# Patient Record
Sex: Female | Born: 1985 | Race: Black or African American | Hispanic: No | Marital: Married | Smoking: Never smoker
Health system: Southern US, Community
[De-identification: ages and names within clinical notes are randomized; demographics above are authoritative.]

## PROBLEM LIST (undated history)

## (undated) ENCOUNTER — Inpatient Hospital Stay (HOSPITAL_COMMUNITY): Payer: Self-pay

## (undated) DIAGNOSIS — D649 Anemia, unspecified: Secondary | ICD-10-CM

## (undated) DIAGNOSIS — D219 Benign neoplasm of connective and other soft tissue, unspecified: Secondary | ICD-10-CM

## (undated) HISTORY — DX: Benign neoplasm of connective and other soft tissue, unspecified: D21.9

---

## 2011-10-17 ENCOUNTER — Encounter (HOSPITAL_COMMUNITY): Payer: Self-pay | Admitting: *Deleted

## 2011-10-17 ENCOUNTER — Emergency Department (HOSPITAL_COMMUNITY)
Admission: EM | Admit: 2011-10-17 | Discharge: 2011-10-17 | Disposition: A | Discharge status: Home / Self Care | Payer: Self-pay | Attending: Emergency Medicine | Admitting: Emergency Medicine

## 2011-10-17 DIAGNOSIS — M545 Low back pain, unspecified: Secondary | ICD-10-CM | POA: Insufficient documentation

## 2011-10-17 DIAGNOSIS — S39012A Strain of muscle, fascia and tendon of lower back, initial encounter: Secondary | ICD-10-CM

## 2011-10-17 LAB — URINALYSIS, ROUTINE W REFLEX MICROSCOPIC
Hgb urine dipstick: NEGATIVE
Nitrite: NEGATIVE
Protein, ur: NEGATIVE mg/dL
Specific Gravity, Urine: 1.028 (ref 1.005–1.030)
Urobilinogen, UA: 1 mg/dL (ref 0.0–1.0)

## 2011-10-17 LAB — POCT PREGNANCY, URINE: Preg Test, Ur: NEGATIVE

## 2011-10-17 MED ORDER — DIAZEPAM 5 MG PO TABS: 5.0000 mg | ORAL_TABLET | Freq: Two times a day (BID) | ORAL | Status: AC

## 2011-10-17 MED ORDER — IBUPROFEN 200 MG PO TABS: 800.0000 mg | ORAL_TABLET | Freq: Three times a day (TID) | ORAL | Status: AC

## 2011-10-17 NOTE — ED Provider Notes (Signed)
History     CSN: [id number]  Arrival date & time 10/17/11  1435   First MD Initiated Contact with Patient 10/17/11 1521      Chief Complaint  Patient presents with  . Back Pain     HPI The patient presents over concerns of the back pain. Chest the onset was 2 days ago, insidious.  She does recall walking and running prior to the onset of symptoms.  She recalls that soon after the onset she may be bending over motion and the pain became worse.  The pain is sore, so it was initially sharp.  The pain is bilateral, otherwise nonradiating.  She notes mild chills, no nausea, no vomiting, no dysuria, no incontinence, no distal extremity dysesthesia or weakness. She notes mild improvement with Aleve, mild exacerbation with motion. History reviewed. No pertinent past medical history.  History reviewed. No pertinent past surgical history.  No family history on file.  History  Substance Use Topics  . Smoking status: Never Smoker   . Smokeless tobacco: Not on file  . Alcohol Use: No     social    OB History    Grav Para Term Preterm Abortions TAB SAB Ect Mult Living                  Review of Systems  All other systems reviewed and are negative.    Allergies  Review of patient's allergies indicates no known allergies.  Home Medications   Current Outpatient Rx  Name Route Sig Dispense Refill  . NAPROXEN SODIUM 220 MG PO TABS Oral Take 220 mg by mouth 2 (two) times daily as needed. FOR PAIN      BP 117/57  Pulse 73  Temp 98.4 F (36.9 C) (Oral)  Resp 17  SpO2 100%  LMP 10/05/2011  Physical Exam  Nursing note and vitals reviewed. Constitutional: She is oriented to person, place, and time. She appears well-developed and well-nourished. No distress.  HENT:  Head: Normocephalic and atraumatic.  Eyes: Conjunctivae and EOM are normal.  Cardiovascular: Normal rate and regular rhythm.   Pulmonary/Chest: Effort normal and breath sounds normal. No stridor. No respiratory  distress.  Abdominal: She exhibits no distension.  Musculoskeletal: She exhibits no edema.       Strength is 5/5 in both lower extremities, gait is appropriate. There is no appreciable tenderness to palpation, nor deformity to either side of her lower back.  Neurological: She is alert and oriented to person, place, and time. No cranial nerve deficit.  Skin: Skin is warm and dry.  Psychiatric: She has a normal mood and affect.    ED Course  Procedures (including critical care time)   Labs Reviewed  URINALYSIS, ROUTINE W REFLEX MICROSCOPIC   No results found.   No diagnosis found.    MDM  This generally well-appearing young female presents with several days of low back pain.  On exam the patient has no notable deficits, nor deformity.  Given the patient's description of mouth chills she was evaluated with a urinalysis.  This was unremarkable.  The patient was discharged with instructions on muscle strain.  We discussed return precautions.  Gerhard Munch, MD 10/17/11 [phone]

## 2011-10-17 NOTE — ED Notes (Signed)
Pt reports back pain starting two days ago located at bilateral lower back and worsens with movement- especially bending over.  Pt denies known injury. Pt denies history of the same.  Pt has taken aleve for pain this AM and yesterday without relief. Pt denies incontinence, numbness or tingling.

## 2013-02-08 ENCOUNTER — Other Ambulatory Visit (HOSPITAL_COMMUNITY): Payer: Self-pay | Admitting: Nurse Practitioner

## 2013-02-08 LAB — OB RESULTS CONSOLE ABO/RH: RH TYPE: POSITIVE

## 2013-02-08 LAB — OB RESULTS CONSOLE HGB/HCT, BLOOD
HEMATOCRIT: 33 %
Hemoglobin: 11.4 g/dL

## 2013-02-08 LAB — OB RESULTS CONSOLE ANTIBODY SCREEN: Antibody Screen: NEGATIVE

## 2013-02-08 LAB — OB RESULTS CONSOLE HEPATITIS B SURFACE ANTIGEN: Hepatitis B Surface Ag: NEGATIVE

## 2013-02-08 LAB — OB RESULTS CONSOLE GC/CHLAMYDIA
Chlamydia: NEGATIVE
Gonorrhea: NEGATIVE

## 2013-02-08 LAB — SYPHILIS ANTIBODY, IGM, ELISA: RPR: NONREACTIVE

## 2013-02-08 LAB — DRUG SCREEN, URINE: Drug Screen, Urine: NEGATIVE

## 2013-02-08 LAB — OB RESULTS CONSOLE PLATELET COUNT: Platelets: 174 10*3/uL

## 2013-02-08 LAB — HGB ELECTROPHORESIS: Hemoglobin, Elp: NORMAL

## 2013-02-08 LAB — OB RESULTS CONSOLE VARICELLA ZOSTER ANTIBODY, IGG: VARICELLA IGG: IMMUNE

## 2013-02-08 LAB — OB RESULTS CONSOLE RUBELLA ANTIBODY, IGM: RUBELLA: IMMUNE

## 2013-02-08 LAB — OB RESULTS CONSOLE HIV ANTIBODY (ROUTINE TESTING): HIV: NONREACTIVE

## 2013-02-09 ENCOUNTER — Other Ambulatory Visit (HOSPITAL_COMMUNITY): Payer: Self-pay | Admitting: Nurse Practitioner

## 2013-02-09 DIAGNOSIS — Z3682 Encounter for antenatal screening for nuchal translucency: Secondary | ICD-10-CM

## 2013-02-12 ENCOUNTER — Ambulatory Visit (HOSPITAL_COMMUNITY)
Admission: RE | Admit: 2013-02-12 | Discharge: 2013-02-12 | Disposition: A | Discharge status: Home / Self Care | Payer: Medicaid Other | Source: Ambulatory Visit | Attending: Nurse Practitioner | Admitting: Nurse Practitioner

## 2013-02-12 ENCOUNTER — Encounter (HOSPITAL_COMMUNITY): Payer: Self-pay

## 2013-02-12 ENCOUNTER — Other Ambulatory Visit: Payer: Self-pay

## 2013-02-12 DIAGNOSIS — O351XX0 Maternal care for (suspected) chromosomal abnormality in fetus, not applicable or unspecified: Secondary | ICD-10-CM | POA: Insufficient documentation

## 2013-02-12 DIAGNOSIS — O3510X0 Maternal care for (suspected) chromosomal abnormality in fetus, unspecified, not applicable or unspecified: Secondary | ICD-10-CM | POA: Insufficient documentation

## 2013-02-12 DIAGNOSIS — Z3682 Encounter for antenatal screening for nuchal translucency: Secondary | ICD-10-CM

## 2013-02-12 DIAGNOSIS — Z3689 Encounter for other specified antenatal screening: Secondary | ICD-10-CM | POA: Insufficient documentation

## 2013-02-12 NOTE — Progress Notes (Signed)
[name]  was seen today for an ultrasound appointment.  See full report in [facility].  Impression: Single IUP at 13 2/7 weeks Normal NT (1.3 mm).  Nasal bone visualized. First trimester aneuploidy screen performed as noted above.  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  Recommend ultrasound for fetal anatomy at approximately [redacted] weeks gestation  Alpha Gula, MD

## 2013-03-08 ENCOUNTER — Other Ambulatory Visit (HOSPITAL_COMMUNITY): Payer: Self-pay | Admitting: Nurse Practitioner

## 2013-03-08 DIAGNOSIS — Z3689 Encounter for other specified antenatal screening: Secondary | ICD-10-CM

## 2013-03-15 LAB — AFP, QUAD SCREEN: Quad Risk Down Syndrome: BORDERLINE

## 2013-03-22 ENCOUNTER — Other Ambulatory Visit (HOSPITAL_COMMUNITY): Payer: Self-pay | Admitting: Nurse Practitioner

## 2013-03-22 ENCOUNTER — Ambulatory Visit (HOSPITAL_COMMUNITY)
Admission: RE | Admit: 2013-03-22 | Discharge: 2013-03-22 | Disposition: A | Discharge status: Home / Self Care | Payer: Medicaid Other | Source: Ambulatory Visit | Attending: Nurse Practitioner | Admitting: Nurse Practitioner

## 2013-03-22 DIAGNOSIS — Z3689 Encounter for other specified antenatal screening: Secondary | ICD-10-CM

## 2013-04-26 ENCOUNTER — Other Ambulatory Visit (HOSPITAL_COMMUNITY): Payer: Self-pay | Admitting: Nurse Practitioner

## 2013-04-26 DIAGNOSIS — D219 Benign neoplasm of connective and other soft tissue, unspecified: Secondary | ICD-10-CM

## 2013-04-26 DIAGNOSIS — Z0489 Encounter for examination and observation for other specified reasons: Secondary | ICD-10-CM

## 2013-04-26 DIAGNOSIS — O26879 Cervical shortening, unspecified trimester: Secondary | ICD-10-CM

## 2013-04-26 DIAGNOSIS — IMO0002 Reserved for concepts with insufficient information to code with codable children: Secondary | ICD-10-CM

## 2013-05-01 ENCOUNTER — Ambulatory Visit (HOSPITAL_COMMUNITY)
Admission: RE | Admit: 2013-05-01 | Discharge: 2013-05-01 | Disposition: A | Discharge status: Home / Self Care | Payer: Medicaid Other | Source: Ambulatory Visit | Attending: Nurse Practitioner | Admitting: Nurse Practitioner

## 2013-05-01 ENCOUNTER — Inpatient Hospital Stay (HOSPITAL_COMMUNITY)
Admission: AD | Admit: 2013-05-01 | Discharge: 2013-05-01 | Disposition: A | Discharge status: Home / Self Care | Payer: Self-pay | Source: Ambulatory Visit | Attending: Obstetrics & Gynecology | Admitting: Obstetrics & Gynecology

## 2013-05-01 ENCOUNTER — Encounter (HOSPITAL_COMMUNITY): Payer: Self-pay | Admitting: *Deleted

## 2013-05-01 DIAGNOSIS — M545 Low back pain, unspecified: Secondary | ICD-10-CM | POA: Insufficient documentation

## 2013-05-01 DIAGNOSIS — D219 Benign neoplasm of connective and other soft tissue, unspecified: Secondary | ICD-10-CM

## 2013-05-01 DIAGNOSIS — Z0489 Encounter for examination and observation for other specified reasons: Secondary | ICD-10-CM

## 2013-05-01 DIAGNOSIS — Z363 Encounter for antenatal screening for malformations: Secondary | ICD-10-CM | POA: Insufficient documentation

## 2013-05-01 DIAGNOSIS — Z1389 Encounter for screening for other disorder: Secondary | ICD-10-CM | POA: Insufficient documentation

## 2013-05-01 DIAGNOSIS — O26879 Cervical shortening, unspecified trimester: Secondary | ICD-10-CM

## 2013-05-01 DIAGNOSIS — B3731 Acute candidiasis of vulva and vagina: Secondary | ICD-10-CM | POA: Insufficient documentation

## 2013-05-01 DIAGNOSIS — B373 Candidiasis of vulva and vagina: Secondary | ICD-10-CM

## 2013-05-01 DIAGNOSIS — O26872 Cervical shortening, second trimester: Secondary | ICD-10-CM

## 2013-05-01 DIAGNOSIS — O2390 Unspecified genitourinary tract infection in pregnancy, unspecified trimester: Secondary | ICD-10-CM | POA: Insufficient documentation

## 2013-05-01 DIAGNOSIS — IMO0002 Reserved for concepts with insufficient information to code with codable children: Secondary | ICD-10-CM

## 2013-05-01 HISTORY — DX: Benign neoplasm of connective and other soft tissue, unspecified: D21.9

## 2013-05-01 LAB — WET PREP, GENITAL
CLUE CELLS WET PREP: NONE SEEN
TRICH WET PREP: NONE SEEN

## 2013-05-01 MED ORDER — FLUCONAZOLE 150 MG PO TABS
150.0000 mg | ORAL_TABLET | Freq: Once | ORAL | Status: AC
  Administered 2013-05-01: 150 mg via ORAL
  Filled 2013-05-01: qty 1

## 2013-05-01 MED ORDER — PROGESTERONE MICRONIZED 200 MG PO CAPS: ORAL_CAPSULE | ORAL | Status: DC

## 2013-05-01 MED ORDER — BETAMETHASONE SOD PHOS & ACET 6 (3-3) MG/ML IJ SUSP
12.0000 mg | Freq: Once | INTRAMUSCULAR | Status: AC
  Administered 2013-05-01: 12 mg via INTRAMUSCULAR
  Filled 2013-05-01: qty 2

## 2013-05-01 NOTE — Discharge Instructions (Signed)
Cervical Insufficiency  Cervical insufficiency is when the cervix is weak and starts to open (dilate) and thin (efface) before the pregnancy is at term and without labor starting. This is also called incompetent cervix. It can happen in the second or third trimester when the fetus starts putting pressure on the cervix. Cervical insufficiency can lead to a miscarriage, preterm premature rupture of the membranes (PPROM), or having the baby early (preterm birth).  RISK FACTORS You may be more likely to develop cervical insufficiency if:  You have a shorter cervix than normal.  Damage or injury occurred to your cervix from a past pregnancy or surgery.  You were born with a cervical defect.  You have had procedure done on the cervix, such as cervical biopsy.  You have a history of cervical insufficiency.  You have a history of PPROM.  You have ended several past pregnancies through abortion.  You were exposed to the drug diethylstilbestrol (DES). SYMPTOMS Often times, women do not have any symptoms. Other times, woman may only have mild symptoms that often start between week 14 through 20. The symptoms may last several days or weeks. These symptoms include:  Light spotting or bleeding from the vagina.  Pelvic pressure.  A change in vaginal discharge, such as discharge that changes from clear, white, or light yellow to pink or tan.  Back pain.  Abdominal pain or cramping. DIAGNOSIS Cervical insufficiency cannot be diagnosed before you become pregnant. Once you are pregnant, your caregiver will ask about your medical history and if you have had any problems in past pregnancies. Tell your caregiver about any procedures performed on your cervix or if you have a history of miscarriages or cervical insufficiency. If your caregiver thinks you are at high risk for cervical insufficiency or show signs of cervical insufficiency, he or she may:  Perform a pelvic exam. This will check for:  The  presence of the membranes (amniotic sac) coming out of the cervix.  Cervical abnormalities.  Cervical injuries.  The presence of contractions.  Perform an ultrasonography (commonly called ultrasound) to measure the length and thickness of the cervix. TREATMENT If you have been diagnosed with cervical insufficiency, your caregiver may recommend:  Limiting physical activity.  Bed rest at home or in the hospital.  Pelvic rest, which means no sexual intercourse or placing anything in the vagina.  Cerclage to sew the cervix closed and prevent it from opening too early. The stitches (sutures) are removed between weeks 36 and 38 to avoid problems during labor. Cerclage may be recommended during pregnancy if you have had a history of miscarriages or preterm births without a known cause. It may also be recommended if you have a short cervix that was identified by ultrasound or if your caregiver has found that your cervix has dilated before 24 weeks of pregnancy. Limiting physical activity and bed rest may or may not help prevent a preterm birth. WHEN SHOULD YOU SEEK IMMEDIATE MEDICAL CARE?  Seek immediate medical care if you show any symptoms of cervical insufficiency. You will need to go to the hospital to get checked immediately. Document Released: 03/22/2005 Document Revised: 11/22/2012 Document Reviewed: 05/29/2012 [facility] Patient Information 2014 [place], [place].

## 2013-05-01 NOTE — MAU Note (Signed)
Short cervix noted on anatomy scan. Sent over for monitoring- to eval for PTL.  Reports low back ache- started yesterday, pain comes and goes

## 2013-05-01 NOTE — Progress Notes (Signed)
Dr Olevia Bowens in earlier to discuss d/c plan and medications. Pt to return tomorrow around 1000 for 2nd Betamethasone inj. Written and verbal d/c instructions given and understanding voiced.

## 2013-05-01 NOTE — Progress Notes (Signed)
Patient agreed to Betamethasone, tolerated well.

## 2013-05-01 NOTE — MAU Provider Note (Signed)
History     CSN: [id number]  Arrival date and time: 05/01/13 2778   First Provider Initiated Contact with Patient 05/01/13 1117      Chief Complaint  Patient presents with  . short cervix, ? PTL    HPI 28 y.o. G1P0 at 81.3 w/ PMH of uterine fibroids, BV in June 2014 presents after TV US with cervical length of 1.6cm.  Denies LOF, VB, Contractions, or cramps.  Reports good FM. Mild bilat back pain last night. No recent illnesses or trauma. Mild vaginal itching.  Past Medical History  Diagnosis Date  . Fibroid     History reviewed. No pertinent past surgical history.  History reviewed. No pertinent family history.  History  Substance Use Topics  . Smoking status: Never Smoker   . Smokeless tobacco: Not on file  . Alcohol Use: No     Comment: social    Allergies: No Known Allergies  Prescriptions prior to admission  Medication Sig Dispense Refill  . Prenatal Vit-Fe Fumarate-FA (PRENATAL MULTIVITAMIN) TABS tablet Take 1 tablet by mouth daily at 12 noon.        Review of Systems  Constitutional: Negative for fever.  HENT: Positive for congestion. Negative for sore throat.   Eyes: Negative for blurred vision and double vision.  Respiratory: Negative for cough and shortness of breath.   Cardiovascular: Negative for chest pain and palpitations.  Gastrointestinal: Positive for constipation. Negative for vomiting, abdominal pain and diarrhea.  Genitourinary: Negative for dysuria, urgency and frequency.  Musculoskeletal: Negative for falls.  Neurological: Negative for dizziness, tingling and headaches.   Physical Exam   Blood pressure 112/69, pulse 89, temperature 98.3 F (36.8 C), temperature source Oral, resp. rate 18, height 5\' 4"  (1.626 m), weight 71.668 kg (158 lb).  Physical Exam  Constitutional: She is oriented to person, place, and time. She appears well-developed and well-nourished.  HENT:  Head: Normocephalic and atraumatic.  Eyes: Conjunctivae are normal.   Cardiovascular: Normal rate, regular rhythm, normal heart sounds and intact distal pulses.   Respiratory: Effort normal and breath sounds normal. No respiratory distress.  GI: There is no tenderness.  Gravid uterus, appropriate  Genitourinary:  No CVA tenderness  Musculoskeletal: She exhibits no edema and no tenderness.  Neurological: She is alert and oriented to person, place, and time.  Skin: Skin is warm and dry.  Psychiatric: She has a normal mood and affect. Her behavior is normal. Thought content normal.    Cervical exam: closed, thick, soft - Per Dr. Olevia Bowens  FHT: Baseline 150. Accels present. Decels absent. Contractions 2x irregular early on strip,then None   MAU Course  Procedures  - Cervical exam - UA, GBS, GC/C, Wet prep,   Assessment and Plan   # High Risk PTL / Cervical length < 2cm on TVUS - No current signs of labor - BMZx1 given today - Likely due to yeast infection - Anticipatory guidance provided including risks and red flags of PTL/ short cervical length - close f/u OP - may consider progesterone  # Vaginal Candidal infection - Fluconazole Rx - f/u OP for resolution of infection  # Pregnancy at 24wks - Continue with regular prenatal care  Wilnette Kales 05/01/2013, 11:46 AM   I have seen and examined this patient and agree with above documentation in the resident's note. 28 yo G1 who presented to [place] for anatomy scan and found to have a dynamic cervix fluctuating from 1.3 to 2.4cm.  She was sent to MAU for evaluation  and BMZ. 2 isolated ctx on toco not felt by pt.  BMZ #1 given.  Cervix closed on exam.  :Pelvic exam done showing yeast and irritation but no other abnormalities. Yeast treated in MAU. Will d/c on prometrium 200mg  PV nightly.  Message sent to clinic to schedule in [facility] due to shortened cervixx.     Ebbie Latus, M.D. [facility] Fellow 05/01/2013 12:45 PM

## 2013-05-01 NOTE — MAU Provider Note (Signed)
Attestation of Attending Supervision of Obstetric Fellow: Evaluation and management procedures were performed by the Obstetric Fellow under my supervision and collaboration.  I have reviewed the Obstetric Fellow's note and chart, and I agree with the management and plan.  Bastien Strawser, MD, FACOG Attending Obstetrician & Gynecologist Faculty Practice, [facility]   

## 2013-05-01 NOTE — MAU Note (Signed)
OK to d/c efm per Dr Olevia Bowens

## 2013-05-01 NOTE — Progress Notes (Signed)
Pt up to BR

## 2013-05-01 NOTE — Progress Notes (Signed)
Discussed Betamethasone. After discussion, patient states she would rather not receive it. Patient states she does not want/like shots. Explained again reason for medication. Medication held for now while patient and husband discuss. May need to discuss further with provider.

## 2013-05-02 ENCOUNTER — Inpatient Hospital Stay (HOSPITAL_COMMUNITY)
Admission: AD | Admit: 2013-05-02 | Discharge: 2013-05-02 | Disposition: A | Discharge status: Home / Self Care | Payer: Medicaid Other | Source: Ambulatory Visit | Attending: Obstetrics & Gynecology | Admitting: Obstetrics & Gynecology

## 2013-05-02 ENCOUNTER — Encounter (HOSPITAL_COMMUNITY): Payer: Self-pay | Admitting: *Deleted

## 2013-05-02 DIAGNOSIS — O4700 False labor before 37 completed weeks of gestation, unspecified trimester: Secondary | ICD-10-CM | POA: Insufficient documentation

## 2013-05-02 LAB — GC/CHLAMYDIA PROBE AMP
CT Probe RNA: NEGATIVE
GC PROBE AMP APTIMA: NEGATIVE

## 2013-05-02 MED ORDER — BETAMETHASONE SOD PHOS & ACET 6 (3-3) MG/ML IJ SUSP
12.0000 mg | Freq: Once | INTRAMUSCULAR | Status: AC
  Administered 2013-05-02: 12 mg via INTRAMUSCULAR
  Filled 2013-05-02: qty 2

## 2013-05-03 ENCOUNTER — Encounter: Payer: Self-pay | Admitting: Obstetrics and Gynecology

## 2013-05-03 DIAGNOSIS — Z141 Cystic fibrosis carrier: Secondary | ICD-10-CM | POA: Insufficient documentation

## 2013-05-03 DIAGNOSIS — D649 Anemia, unspecified: Secondary | ICD-10-CM | POA: Insufficient documentation

## 2013-05-03 DIAGNOSIS — D219 Benign neoplasm of connective and other soft tissue, unspecified: Secondary | ICD-10-CM | POA: Insufficient documentation

## 2013-05-03 DIAGNOSIS — B379 Candidiasis, unspecified: Secondary | ICD-10-CM | POA: Insufficient documentation

## 2013-05-03 DIAGNOSIS — R772 Abnormality of alphafetoprotein: Secondary | ICD-10-CM | POA: Insufficient documentation

## 2013-05-03 DIAGNOSIS — D259 Leiomyoma of uterus, unspecified: Secondary | ICD-10-CM

## 2013-05-07 ENCOUNTER — Encounter: Payer: Self-pay | Admitting: *Deleted

## 2013-05-24 ENCOUNTER — Encounter: Payer: Self-pay | Admitting: Family Medicine

## 2013-05-24 ENCOUNTER — Ambulatory Visit (INDEPENDENT_AMBULATORY_CARE_PROVIDER_SITE_OTHER): Payer: Self-pay | Admitting: Family Medicine

## 2013-05-24 VITALS — BP 119/73 | Ht 65.0 in | Wt 163.6 lb

## 2013-05-24 DIAGNOSIS — O0990 Supervision of high risk pregnancy, unspecified, unspecified trimester: Secondary | ICD-10-CM | POA: Insufficient documentation

## 2013-05-24 DIAGNOSIS — O26879 Cervical shortening, unspecified trimester: Secondary | ICD-10-CM

## 2013-05-24 DIAGNOSIS — Z141 Cystic fibrosis carrier: Secondary | ICD-10-CM

## 2013-05-24 DIAGNOSIS — O26873 Cervical shortening, third trimester: Secondary | ICD-10-CM | POA: Insufficient documentation

## 2013-05-24 LAB — CBC
HEMATOCRIT: 29.7 % — AB (ref 36.0–46.0)
HEMOGLOBIN: 10.2 g/dL — AB (ref 12.0–15.0)
MCH: 30.1 pg (ref 26.0–34.0)
MCHC: 34.3 g/dL (ref 30.0–36.0)
MCV: 87.6 fL (ref 78.0–100.0)
Platelets: 151 10*3/uL (ref 150–400)
RBC: 3.39 MIL/uL — ABNORMAL LOW (ref 3.87–5.11)
RDW: 13.6 % (ref 11.5–15.5)
WBC: 7.5 10*3/uL (ref 4.0–10.5)

## 2013-05-24 LAB — POCT URINALYSIS DIP (DEVICE)
Bilirubin Urine: NEGATIVE
Glucose, UA: NEGATIVE mg/dL
HGB URINE DIPSTICK: NEGATIVE
Ketones, ur: NEGATIVE mg/dL
NITRITE: NEGATIVE
PH: 6 (ref 5.0–8.0)
PROTEIN: NEGATIVE mg/dL
Specific Gravity, Urine: 1.025 (ref 1.005–1.030)
UROBILINOGEN UA: 0.2 mg/dL (ref 0.0–1.0)

## 2013-05-24 LAB — GLUCOSE TOLERANCE, 1 HOUR (50G) W/O FASTING: Glucose, 1 Hour GTT: 83 mg/dL (ref 70–140)

## 2013-05-24 LAB — HIV ANTIBODY (ROUTINE TESTING W REFLEX): HIV: NONREACTIVE

## 2013-05-24 LAB — RPR

## 2013-05-24 MED ORDER — TETANUS-DIPHTH-ACELL PERTUSSIS 5-2.5-18.5 LF-MCG/0.5 IM SUSP: 0.5000 mL | Freq: Once | INTRAMUSCULAR | Status: DC

## 2013-05-24 NOTE — Progress Notes (Signed)
Pulse: 87 28 week labs today

## 2013-05-24 NOTE — Progress Notes (Signed)
New Ob transfer from [facility] for shortened cervix, on prometrium and s/p Betamethasone. 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 28 wk labs and TDaP today

## 2013-05-24 NOTE — Progress Notes (Signed)
Nutrition note: 1st visit consult Pt has gained 15.6# @ [redacted]w[redacted]d, which is wnl. Pt reports eating 2 meal & 3-4 snacks/d. Pt is taking a PNV. Pt reports no N/V or heartburn. NKFA Pt received verbal & written education on general nutrition during pregnancy. Discussed wt gain goals of 25-35# or 1#/wk. Pt agrees to continue taking PNV. Pt has [place] & plans to BF. F/u if referred Vladimir Faster, MS, RD, LDN, [facility]

## 2013-05-24 NOTE — Patient Instructions (Addendum)
Having a circumcision done in the hospital costs approximately $507.  This will have to be paid in full prior to circumcision being performed.  There are places to have circumcision done as an outpatient which are cheaper.    Circumcisions      Provider   Phone    Price     ------------------------------------------------------------------------------   [facility]  [phone]  [phone] by 4 wks     [facility]   [phone]  $317.20 by 4 wks     [facility]   [phone]  $175 by 2 wks    [facility]   [id number]  $250 by 7 days  Third Trimester of Pregnancy The third trimester is from week 29 through week 42, months 7 through 9. The third trimester is a time when the fetus is growing rapidly. At the end of the ninth month, the fetus is about 20 inches in length and weighs 6 10 pounds.  BODY CHANGES Your body goes through many changes during pregnancy. The changes vary from woman to woman.   Your weight will continue to increase. You can expect to gain 25 35 pounds (11 16 kg) by the end of the pregnancy.  You may begin to get stretch marks on your hips, abdomen, and breasts.  You may urinate more often because the fetus is moving lower into your pelvis and pressing on your bladder.  You may develop or continue to have heartburn as a result of your pregnancy.  You may develop constipation because certain hormones are causing the muscles that push waste through your intestines to slow down.  You may develop hemorrhoids or swollen, bulging veins (varicose veins).  You may have pelvic pain because of the weight gain and pregnancy hormones relaxing your joints between the bones in your pelvis. Back aches may result from over exertion of the muscles supporting your posture.  Your breasts will continue to grow and be tender. A yellow discharge may leak from your breasts called colostrum.  Your belly button may stick out.  You may feel short of breath because of your expanding uterus.  You may notice the  fetus "dropping," or moving lower in your abdomen.  You may have a bloody mucus discharge. This usually occurs a few days to a week before labor begins.  Your cervix becomes thin and soft (effaced) near your due date. WHAT TO EXPECT AT YOUR PRENATAL EXAMS  You will have prenatal exams every 2 weeks until week 36. Then, you will have weekly prenatal exams. During a routine prenatal visit:  You will be weighed to make sure you and the fetus are growing normally.  Your blood pressure is taken.  Your abdomen will be measured to track your baby's growth.  The fetal heartbeat will be listened to.  Any test results from the previous visit will be discussed.  You may have a cervical check near your due date to see if you have effaced. At around 36 weeks, your caregiver will check your cervix. At the same time, your caregiver will also perform a test on the secretions of the vaginal tissue. This test is to determine if a type of bacteria, Group B streptococcus, is present. Your caregiver will explain this further. Your caregiver may ask you:  What your birth plan is.  How you are feeling.  If you are feeling the baby move.  If you have had any abnormal symptoms, such as leaking fluid, bleeding, severe headaches, or abdominal cramping.  If you have  any questions. Other tests or screenings that may be performed during your third trimester include:  Blood tests that check for low iron levels (anemia).  Fetal testing to check the health, activity level, and growth of the fetus. Testing is done if you have certain medical conditions or if there are problems during the pregnancy. FALSE LABOR You may feel small, irregular contractions that eventually go away. These are called Braxton Hicks contractions, or false labor. Contractions may last for hours, days, or even weeks before true labor sets in. If contractions come at regular intervals, intensify, or become painful, it is best to be seen by  your caregiver.  SIGNS OF LABOR   Menstrual-like cramps.  Contractions that are 5 minutes apart or less.  Contractions that start on the top of the uterus and spread down to the lower abdomen and back.  A sense of increased pelvic pressure or back pain.  A watery or bloody mucus discharge that comes from the vagina. If you have any of these signs before the 37th week of pregnancy, call your caregiver right away. You need to go to the hospital to get checked immediately. HOME CARE INSTRUCTIONS   Avoid all smoking, herbs, alcohol, and unprescribed drugs. These chemicals affect the formation and growth of the baby.  Follow your caregiver's instructions regarding medicine use. There are medicines that are either safe or unsafe to take during pregnancy.  Exercise only as directed by your caregiver. Experiencing uterine cramps is a good sign to stop exercising.  Continue to eat regular, healthy meals.  Wear a good support bra for breast tenderness.  Do not use hot tubs, steam rooms, or saunas.  Wear your seat belt at all times when driving.  Avoid raw meat, uncooked cheese, cat litter boxes, and soil used by cats. These carry germs that can cause birth defects in the baby.  Take your prenatal vitamins.  Try taking a stool softener (if your caregiver approves) if you develop constipation. Eat more high-fiber foods, such as fresh vegetables or fruit and whole grains. Drink plenty of fluids to keep your urine clear or pale yellow.  Take warm sitz baths to soothe any pain or discomfort caused by hemorrhoids. Use hemorrhoid cream if your caregiver approves.  If you develop varicose veins, wear support hose. Elevate your feet for 15 minutes, 3 4 times a day. Limit salt in your diet.  Avoid heavy lifting, wear low heal shoes, and practice good posture.  Rest a lot with your legs elevated if you have leg cramps or low back pain.  Visit your dentist if you have not gone during your  pregnancy. Use a soft toothbrush to brush your teeth and be gentle when you floss.  A sexual relationship may be continued unless your caregiver directs you otherwise.  Do not travel far distances unless it is absolutely necessary and only with the approval of your caregiver.  Take prenatal classes to understand, practice, and ask questions about the labor and delivery.  Make a trial run to the hospital.  Pack your hospital bag.  Prepare the baby's nursery.  Continue to go to all your prenatal visits as directed by your caregiver. SEEK MEDICAL CARE IF:  You are unsure if you are in labor or if your water has broken.  You have dizziness.  You have mild pelvic cramps, pelvic pressure, or nagging pain in your abdominal area.  You have persistent nausea, vomiting, or diarrhea.  You have a bad smelling vaginal discharge.  You have pain with urination. SEEK IMMEDIATE MEDICAL CARE IF:   You have a fever.  You are leaking fluid from your vagina.  You have spotting or bleeding from your vagina.  You have severe abdominal cramping or pain.  You have rapid weight loss or gain.  You have shortness of breath with chest pain.  You notice sudden or extreme swelling of your face, hands, ankles, feet, or legs.  You have not felt your baby move in over an hour.  You have severe headaches that do not go away with medicine.  You have vision changes. Document Released: 03/16/2001 Document Revised: 11/22/2012 Document Reviewed: 05/23/2012 [facility] Patient Information 2014 [place].  Breastfeeding Deciding to breastfeed is one of the best choices you can make for you and your baby. A change in hormones during pregnancy causes your breast tissue to grow and increases the number and size of your milk ducts. These hormones also allow proteins, sugars, and fats from your blood supply to make breast milk in your milk-producing glands. Hormones prevent breast milk from being released  before your baby is born as well as prompt milk flow after birth. Once breastfeeding has begun, thoughts of your baby, as well as his or her sucking or crying, can stimulate the release of milk from your milk-producing glands.  BENEFITS OF BREASTFEEDING For Your Baby  Your first milk (colostrum) helps your baby's digestive system function better.   There are antibodies in your milk that help your baby fight off infections.   Your baby has a lower incidence of asthma, allergies, and sudden infant death syndrome.   The nutrients in breast milk are better for your baby than infant formulas and are designed uniquely for your baby's needs.   Breast milk improves your baby's brain development.   Your baby is less likely to develop other conditions, such as childhood obesity, asthma, or type 2 diabetes mellitus.  For You   Breastfeeding helps to create a very special bond between you and your baby.   Breastfeeding is convenient. Breast milk is always available at the correct temperature and costs nothing.   Breastfeeding helps to burn calories and helps you lose the weight gained during pregnancy.   Breastfeeding makes your uterus contract to its prepregnancy size faster and slows bleeding (lochia) after you give birth.   Breastfeeding helps to lower your risk of developing type 2 diabetes mellitus, osteoporosis, and breast or ovarian cancer later in life. SIGNS THAT YOUR BABY IS HUNGRY Early Signs of Hunger  Increased alertness or activity.  Stretching.  Movement of the head from side to side.  Movement of the head and opening of the mouth when the corner of the mouth or cheek is stroked (rooting).  Increased sucking sounds, smacking lips, cooing, sighing, or squeaking.  Hand-to-mouth movements.  Increased sucking of fingers or hands. Late Signs of Hunger  Fussing.  Intermittent crying. Extreme Signs of Hunger Signs of extreme hunger will require calming and  consoling before your baby will be able to breastfeed successfully. Do not wait for the following signs of extreme hunger to occur before you initiate breastfeeding:   Restlessness.  A loud, strong cry.   Screaming. BREASTFEEDING BASICS Breastfeeding Initiation  Find a comfortable place to sit or lie down, with your neck and back well supported.  Place a pillow or rolled up blanket under your baby to bring him or her to the level of your breast (if you are seated). Nursing pillows are specially  designed to help support your arms and your baby while you breastfeed.  Make sure that your baby's abdomen is facing your abdomen.   Gently massage your breast. With your fingertips, massage from your chest wall toward your nipple in a circular motion. This encourages milk flow. You may need to continue this action during the feeding if your milk flows slowly.  Support your breast with 4 fingers underneath and your thumb above your nipple. Make sure your fingers are well away from your nipple and your baby's mouth.   Stroke your baby's lips gently with your finger or nipple.   When your baby's mouth is open wide enough, quickly bring your baby to your breast, placing your entire nipple and as much of the colored area around your nipple (areola) as possible into your baby's mouth.   More areola should be visible above your baby's upper lip than below the lower lip.   Your baby's tongue should be between his or her lower gum and your breast.   Ensure that your baby's mouth is correctly positioned around your nipple (latched). Your baby's lips should create a seal on your breast and be turned out (everted).  It is common for your baby to suck about 2 3 minutes in order to start the flow of breast milk. Latching Teaching your baby how to latch on to your breast properly is very important. An improper latch can cause nipple pain and decreased milk supply for you and poor weight gain in your  baby. Also, if your baby is not latched onto your nipple properly, he or she may swallow some air during feeding. This can make your baby fussy. Burping your baby when you switch breasts during the feeding can help to get rid of the air. However, teaching your baby to latch on properly is still the best way to prevent fussiness from swallowing air while breastfeeding. Signs that your baby has successfully latched on to your nipple:    Silent tugging or silent sucking, without causing you pain.   Swallowing heard between every 3 4 sucks.    Muscle movement above and in front of his or her ears while sucking.  Signs that your baby has not successfully latched on to nipple:   Sucking sounds or smacking sounds from your baby while breastfeeding.  Nipple pain. If you think your baby has not latched on correctly, slip your finger into the corner of your baby's mouth to break the suction and place it between your baby's gums. Attempt breastfeeding initiation again. Signs of Successful Breastfeeding Signs from your baby:   A gradual decrease in the number of sucks or complete cessation of sucking.   Falling asleep.   Relaxation of his or her body.   Retention of a small amount of milk in his or her mouth.   Letting go of your breast by himself or herself. Signs from you:  Breasts that have increased in firmness, weight, and size 1 3 hours after feeding.   Breasts that are softer immediately after breastfeeding.  Increased milk volume, as well as a change in milk consistency and color by the 5th day of breastfeeding.   Nipples that are not sore, cracked, or bleeding. Signs That Your [employer] is Getting Enough Milk  Wetting at least 3 diapers in a 24-hour period. The urine should be clear and pale yellow by age 765 days.  At least 3 stools in a 24-hour period by age 765 days. The stool should be  soft and yellow.  At least 3 stools in a 24-hour period by age 53 days. The stool should  be seedy and yellow.  No loss of weight greater than 10% of birth weight during the first 47 days of age.  Average weight gain of 4 7 ounces (120 210 mL) per week after age 72 days.  Consistent daily weight gain by age 533 days, without weight loss after the age of 2 weeks. After a feeding, your baby may spit up a small amount. This is common. BREASTFEEDING FREQUENCY AND DURATION Frequent feeding will help you make more milk and can prevent sore nipples and breast engorgement. Breastfeed when you feel the need to reduce the fullness of your breasts or when your baby shows signs of hunger. This is called "breastfeeding on demand." Avoid introducing a pacifier to your baby while you are working to establish breastfeeding (the first 4 6 weeks after your baby is born). After this time you may choose to use a pacifier. Research has shown that pacifier use during the first year of a baby's life decreases the risk of sudden infant death syndrome (SIDS). Allow your baby to feed on each breast as long as he or she wants. Breastfeed until your baby is finished feeding. When your baby unlatches or falls asleep while feeding from the first breast, offer the second breast. Because newborns are often sleepy in the first few weeks of life, you may need to awaken your baby to get him or her to feed. Breastfeeding times will vary from baby to baby. However, the following rules can serve as a guide to help you ensure that your baby is properly fed:  Newborns (babies 38 weeks of age or younger) may breastfeed every 1 3 hours.  Newborns should not go longer than 3 hours during the day or 5 hours during the night without breastfeeding.  You should breastfeed your baby a minimum of 8 times in a 24-hour period until you begin to introduce solid foods to your baby at around 59 months of age. BREAST MILK PUMPING Pumping and storing breast milk allows you to ensure that your baby is exclusively fed your breast milk, even at  times when you are unable to breastfeed. This is especially important if you are going back to work while you are still breastfeeding or when you are not able to be present during feedings. Your lactation consultant can give you guidelines on how long it is safe to store breast milk.  A breast pump is a machine that allows you to pump milk from your breast into a sterile bottle. The pumped breast milk can then be stored in a refrigerator or freezer. Some breast pumps are operated by hand, while others use electricity. Ask your lactation consultant which type will work best for you. Breast pumps can be purchased, but some hospitals and breastfeeding support groups lease breast pumps on a monthly basis. A lactation consultant can teach you how to hand express breast milk, if you prefer not to use a pump.  CARING FOR YOUR BREASTS WHILE YOU BREASTFEED Nipples can become dry, cracked, and sore while breastfeeding. The following recommendations can help keep your breasts moisturized and healthy:  Avoid using soap on your nipples.   Wear a supportive bra. Although not required, special nursing bras and tank tops are designed to allow access to your breasts for breastfeeding without taking off your entire bra or top. Avoid wearing underwire style bras or extremely tight bras.  Air dry your nipples for 3 77minutes after each feeding.   Use only cotton bra pads to absorb leaked breast milk. Leaking of breast milk between feedings is normal.   Use lanolin on your nipples after breastfeeding. Lanolin helps to maintain your skin's normal moisture barrier. If you use pure lanolin you do not need to wash it off before feeding your baby again. Pure lanolin is not toxic to your baby. You may also hand express a few drops of breast milk and gently massage that milk into your nipples and allow the milk to air dry. In the first few weeks after giving birth, some women experience extremely full breasts (engorgement).  Engorgement can make your breasts feel heavy, warm, and tender to the touch. Engorgement peaks within 3 5 days after you give birth. The following recommendations can help ease engorgement:  Completely empty your breasts while breastfeeding or pumping. You may want to start by applying warm, moist heat (in the shower or with warm water-soaked hand towels) just before feeding or pumping. This increases circulation and helps the milk flow. If your baby does not completely empty your breasts while breastfeeding, pump any extra milk after he or she is finished.  Wear a snug bra (nursing or regular) or tank top for 1 2 days to signal your body to slightly decrease milk production.  Apply ice packs to your breasts, unless this is too uncomfortable for you.  Make sure that your baby is latched on and positioned properly while breastfeeding. If engorgement persists after 48 hours of following these recommendations, contact your health care provider or a Science writer. OVERALL HEALTH CARE RECOMMENDATIONS WHILE BREASTFEEDING  Eat healthy foods. Alternate between meals and snacks, eating 3 of each per day. Because what you eat affects your breast milk, some of the foods may make your baby more irritable than usual. Avoid eating these foods if you are sure that they are negatively affecting your baby.  Drink milk, fruit juice, and water to satisfy your thirst (about 10 glasses a day).   Rest often, relax, and continue to take your prenatal vitamins to prevent fatigue, stress, and anemia.  Continue breast self-awareness checks.  Avoid chewing and smoking tobacco.  Avoid alcohol and drug use. Some medicines that may be harmful to your baby can pass through breast milk. It is important to ask your health care provider before taking any medicine, including all over-the-counter and prescription medicine as well as vitamin and herbal supplements. It is possible to become pregnant while breastfeeding.  If birth control is desired, ask your health care provider about options that will be safe for your baby. SEEK MEDICAL CARE IF:   You feel like you want to stop breastfeeding or have become frustrated with breastfeeding.  You have painful breasts or nipples.  Your nipples are cracked or bleeding.  Your breasts are red, tender, or warm.  You have a swollen area on either breast.  You have a fever or chills.  You have nausea or vomiting.  You have drainage other than breast milk from your nipples.  Your breasts do not become full before feedings by the 5th day after you give birth.  You feel sad and depressed.  Your baby is too sleepy to eat well.  Your baby is having trouble sleeping.   Your baby is wetting less than 3 diapers in a 24-hour period.  Your baby has less than 3 stools in a 24-hour period.  Your baby's skin or the  white part of his or her eyes becomes yellow.   Your baby is not gaining weight by 33 days of age. SEEK IMMEDIATE MEDICAL CARE IF:   Your baby is overly tired (lethargic) and does not want to wake up and feed.  Your baby develops an unexplained fever. Document Released: 03/22/2005 Document Revised: 11/22/2012 Document Reviewed: 09/13/2012 [facility] Patient Information 2014 [place]. Preterm Labor Information Preterm labor is when labor starts at less than 37 weeks of pregnancy. The normal length of a pregnancy is 39 to 41 weeks. CAUSES Often, there is no identifiable underlying cause as to why a woman goes into preterm labor. One of the most common known causes of preterm labor is infection. Infections of the uterus, cervix, vagina, amniotic sac, bladder, kidney, or even the lungs (pneumonia) can cause labor to start. Other suspected causes of preterm labor include:   Urogenital infections, such as yeast infections and bacterial vaginosis.   Uterine abnormalities (uterine shape, uterine septum, fibroids, or bleeding from the placenta).    A cervix that has been operated on (it may fail to stay closed).   Malformations in the fetus.   Multiple gestations (twins, triplets, and so on).   Breakage of the amniotic sac.  RISK FACTORS  Having a previous history of preterm labor.   Having premature rupture of membranes (PROM).   Having a placenta that covers the opening of the cervix (placenta previa).   Having a placenta that separates from the uterus (placental abruption).   Having a cervix that is too weak to hold the fetus in the uterus (incompetent cervix).   Having too much fluid in the amniotic sac (polyhydramnios).   Taking illegal drugs or smoking while pregnant.   Not gaining enough weight while pregnant.   Being younger than 38 and older than 30 years old.   Having a low socioeconomic status.   Being African American. SYMPTOMS Signs and symptoms of preterm labor include:   Menstrual-like cramps, abdominal pain, or back pain.  Uterine contractions that are regular, as frequent as six in an hour, regardless of their intensity (may be mild or painful).  Contractions that start on the top of the uterus and spread down to the lower abdomen and back.   A sense of increased pelvic pressure.   A watery or bloody mucus discharge that comes from the vagina.  TREATMENT Depending on the length of the pregnancy and other circumstances, your health care provider may suggest bed rest. If necessary, there are medicines that can be given to stop contractions and to mature the fetal lungs. If labor happens before 34 weeks of pregnancy, a prolonged hospital stay may be recommended. Treatment depends on the condition of both you and the fetus.  WHAT SHOULD YOU DO IF YOU THINK YOU ARE IN PRETERM LABOR? Call your health care provider right away. You will need to go to the hospital to get checked immediately. HOW CAN YOU PREVENT PRETERM LABOR IN FUTURE PREGNANCIES? You should:   Stop smoking if you  smoke.  Maintain healthy weight gain and avoid chemicals and drugs that are not necessary.  Be watchful for any type of infection.  Inform your health care provider if you have a known history of preterm labor. Document Released: 06/12/2003 Document Revised: 11/22/2012 Document Reviewed: 04/24/2012 [facility] Patient Information 2014 [place], [place].

## 2013-05-25 ENCOUNTER — Encounter: Payer: Self-pay | Admitting: Family Medicine

## 2013-06-09 ENCOUNTER — Encounter: Payer: Self-pay | Admitting: *Deleted

## 2013-06-14 ENCOUNTER — Ambulatory Visit (INDEPENDENT_AMBULATORY_CARE_PROVIDER_SITE_OTHER): Payer: PRIVATE HEALTH INSURANCE | Admitting: Obstetrics & Gynecology

## 2013-06-14 VITALS — BP 118/69 | Temp 96.9°F | Wt 164.9 lb

## 2013-06-14 DIAGNOSIS — O26879 Cervical shortening, unspecified trimester: Secondary | ICD-10-CM

## 2013-06-14 DIAGNOSIS — O26873 Cervical shortening, third trimester: Secondary | ICD-10-CM

## 2013-06-14 DIAGNOSIS — O0990 Supervision of high risk pregnancy, unspecified, unspecified trimester: Secondary | ICD-10-CM

## 2013-06-14 LAB — POCT URINALYSIS DIP (DEVICE)
Bilirubin Urine: NEGATIVE
Glucose, UA: NEGATIVE mg/dL
Hgb urine dipstick: NEGATIVE
KETONES UR: NEGATIVE mg/dL
Nitrite: NEGATIVE
Protein, ur: NEGATIVE mg/dL
Specific Gravity, Urine: 1.01 (ref 1.005–1.030)
UROBILINOGEN UA: 0.2 mg/dL (ref 0.0–1.0)
pH: 5.5 (ref 5.0–8.0)

## 2013-06-14 NOTE — Patient Instructions (Signed)

## 2013-06-14 NOTE — Progress Notes (Signed)
Pulse: 94 Pt states that she isn't using the Prometrium because she didn't feel that she needed it.

## 2013-06-14 NOTE — Progress Notes (Signed)
No problems.  Cervix 1 cm which is stable.  PTL precuations given.  Pt calling for peds.

## 2013-07-05 ENCOUNTER — Ambulatory Visit (INDEPENDENT_AMBULATORY_CARE_PROVIDER_SITE_OTHER): Payer: PRIVATE HEALTH INSURANCE | Admitting: Obstetrics & Gynecology

## 2013-07-05 VITALS — BP 110/76 | Temp 97.4°F | Wt 166.7 lb

## 2013-07-05 DIAGNOSIS — O26873 Cervical shortening, third trimester: Secondary | ICD-10-CM

## 2013-07-05 DIAGNOSIS — Z141 Cystic fibrosis carrier: Secondary | ICD-10-CM

## 2013-07-05 DIAGNOSIS — O0990 Supervision of high risk pregnancy, unspecified, unspecified trimester: Secondary | ICD-10-CM

## 2013-07-05 DIAGNOSIS — B3731 Acute candidiasis of vulva and vagina: Secondary | ICD-10-CM

## 2013-07-05 DIAGNOSIS — B373 Candidiasis of vulva and vagina: Secondary | ICD-10-CM

## 2013-07-05 DIAGNOSIS — R799 Abnormal finding of blood chemistry, unspecified: Secondary | ICD-10-CM

## 2013-07-05 DIAGNOSIS — R772 Abnormality of alphafetoprotein: Secondary | ICD-10-CM

## 2013-07-05 DIAGNOSIS — O26879 Cervical shortening, unspecified trimester: Secondary | ICD-10-CM

## 2013-07-05 LAB — POCT URINALYSIS DIP (DEVICE)
Bilirubin Urine: NEGATIVE
Glucose, UA: NEGATIVE mg/dL
HGB URINE DIPSTICK: NEGATIVE
Ketones, ur: NEGATIVE mg/dL
Nitrite: NEGATIVE
PH: 6 (ref 5.0–8.0)
Protein, ur: NEGATIVE mg/dL
SPECIFIC GRAVITY, URINE: 1.02 (ref 1.005–1.030)
UROBILINOGEN UA: 1 mg/dL (ref 0.0–1.0)

## 2013-07-05 MED ORDER — FLUCONAZOLE 150 MG PO TABS: 150.0000 mg | ORAL_TABLET | Freq: Once | ORAL | Status: DC

## 2013-07-05 MED ORDER — PRENATAL MULTIVITAMIN CH: 1.0000 | ORAL_TABLET | Freq: Every day | ORAL | Status: DC

## 2013-07-05 NOTE — Patient Instructions (Signed)
Preterm Labor Information Preterm labor is when labor starts at less than 37 weeks of pregnancy. The normal length of a pregnancy is 39 to 41 weeks. CAUSES Often, there is no identifiable underlying cause as to why a woman goes into preterm labor. One of the most common known causes of preterm labor is infection. Infections of the uterus, cervix, vagina, amniotic sac, bladder, kidney, or even the lungs (pneumonia) can cause labor to start. Other suspected causes of preterm labor include:   Urogenital infections, such as yeast infections and bacterial vaginosis.   Uterine abnormalities (uterine shape, uterine septum, fibroids, or bleeding from the placenta).   A cervix that has been operated on (it may fail to stay closed).   Malformations in the fetus.   Multiple gestations (twins, triplets, and so on).   Breakage of the amniotic sac.  RISK FACTORS  Having a previous history of preterm labor.   Having premature rupture of membranes (PROM).   Having a placenta that covers the opening of the cervix (placenta previa).   Having a placenta that separates from the uterus (placental abruption).   Having a cervix that is too weak to hold the fetus in the uterus (incompetent cervix).   Having too much fluid in the amniotic sac (polyhydramnios).   Taking illegal drugs or smoking while pregnant.   Not gaining enough weight while pregnant.   Being younger than 18 and older than 30 years old.   Having a low socioeconomic status.   Being African American. SYMPTOMS Signs and symptoms of preterm labor include:   Menstrual-like cramps, abdominal pain, or back pain.  Uterine contractions that are regular, as frequent as six in an hour, regardless of their intensity (may be mild or painful).  Contractions that start on the top of the uterus and spread down to the lower abdomen and back.   A sense of increased pelvic pressure.   A watery or bloody mucus discharge that  comes from the vagina.  TREATMENT Depending on the length of the pregnancy and other circumstances, your health care provider may suggest bed rest. If necessary, there are medicines that can be given to stop contractions and to mature the fetal lungs. If labor happens before 34 weeks of pregnancy, a prolonged hospital stay may be recommended. Treatment depends on the condition of both you and the fetus.  WHAT SHOULD YOU DO IF YOU THINK YOU ARE IN PRETERM LABOR? Call your health care provider right away. You will need to go to the hospital to get checked immediately. HOW CAN YOU PREVENT PRETERM LABOR IN FUTURE PREGNANCIES? You should:   Stop smoking if you smoke.  Maintain healthy weight gain and avoid chemicals and drugs that are not necessary.  Be watchful for any type of infection.  Inform your health care provider if you have a known history of preterm labor. Document Released: 06/12/2003 Document Revised: 11/22/2012 Document Reviewed: 04/24/2012 [employer] Patient Information 2014 [employer]    

## 2013-07-05 NOTE — Progress Notes (Signed)
P= 89 C/o of thin yellow discharge and itching on outside of vagina. Wet prep today.

## 2013-07-05 NOTE — Progress Notes (Signed)
C/o recurrent yeast vaginitis Rx diflucan

## 2013-07-06 LAB — WET PREP, GENITAL
Clue Cells Wet Prep HPF POC: NONE SEEN
Trich, Wet Prep: NONE SEEN

## 2013-07-09 ENCOUNTER — Telehealth: Payer: Self-pay | Admitting: General Practice

## 2013-07-09 NOTE — Telephone Encounter (Signed)
Called patient regarding wet prep results. Patient verbalized understanding and stated she took the two pills for the yeast infection already. Patient had no further questions

## 2013-07-19 ENCOUNTER — Encounter: Payer: Self-pay | Admitting: Family

## 2013-07-26 ENCOUNTER — Ambulatory Visit (INDEPENDENT_AMBULATORY_CARE_PROVIDER_SITE_OTHER): Payer: Self-pay | Admitting: Obstetrics & Gynecology

## 2013-07-26 VITALS — BP 111/66 | HR 89 | Temp 97.0°F | Wt 170.5 lb

## 2013-07-26 DIAGNOSIS — O26879 Cervical shortening, unspecified trimester: Secondary | ICD-10-CM

## 2013-07-26 DIAGNOSIS — O26873 Cervical shortening, third trimester: Secondary | ICD-10-CM

## 2013-07-26 LAB — POCT URINALYSIS DIP (DEVICE)
BILIRUBIN URINE: NEGATIVE
GLUCOSE, UA: NEGATIVE mg/dL
HGB URINE DIPSTICK: NEGATIVE
KETONES UR: NEGATIVE mg/dL
Nitrite: NEGATIVE
Protein, ur: NEGATIVE mg/dL
SPECIFIC GRAVITY, URINE: 1.02 (ref 1.005–1.030)
Urobilinogen, UA: 0.2 mg/dL (ref 0.0–1.0)
pH: 7 (ref 5.0–8.0)

## 2013-07-26 NOTE — Patient Instructions (Signed)
Return to clinic for any obstetric concerns or go to [facility] for evaluation  

## 2013-07-26 NOTE — Progress Notes (Signed)
Pelvic cultures done. Cervix 2.5/80/-2/cephalic. No other complaints or concerns.  Fetal movement and labor precautions reviewed.

## 2013-07-26 NOTE — Progress Notes (Signed)
C/o of intermittent pelvic pain/pressure. GBS and cultures today.

## 2013-07-27 ENCOUNTER — Encounter: Payer: Self-pay | Admitting: Obstetrics & Gynecology

## 2013-07-27 LAB — GC/CHLAMYDIA PROBE AMP
CT Probe RNA: NEGATIVE
GC Probe RNA: NEGATIVE

## 2013-07-28 ENCOUNTER — Encounter: Payer: Self-pay | Admitting: Obstetrics & Gynecology

## 2013-07-28 DIAGNOSIS — O99820 Streptococcus B carrier state complicating pregnancy: Secondary | ICD-10-CM | POA: Insufficient documentation

## 2013-07-29 LAB — CULTURE, STREPTOCOCCUS GRP B W/SUSCEPT

## 2013-08-02 ENCOUNTER — Ambulatory Visit (INDEPENDENT_AMBULATORY_CARE_PROVIDER_SITE_OTHER): Payer: Self-pay | Admitting: Family Medicine

## 2013-08-02 VITALS — BP 127/67 | Temp 98.5°F | Wt 170.7 lb

## 2013-08-02 DIAGNOSIS — O26879 Cervical shortening, unspecified trimester: Secondary | ICD-10-CM

## 2013-08-02 DIAGNOSIS — O0990 Supervision of high risk pregnancy, unspecified, unspecified trimester: Secondary | ICD-10-CM

## 2013-08-02 DIAGNOSIS — O09899 Supervision of other high risk pregnancies, unspecified trimester: Secondary | ICD-10-CM

## 2013-08-02 DIAGNOSIS — O26873 Cervical shortening, third trimester: Secondary | ICD-10-CM

## 2013-08-02 DIAGNOSIS — Z22330 Carrier of Group B streptococcus: Secondary | ICD-10-CM

## 2013-08-02 DIAGNOSIS — O99820 Streptococcus B carrier state complicating pregnancy: Secondary | ICD-10-CM

## 2013-08-02 LAB — POCT URINALYSIS DIP (DEVICE)
BILIRUBIN URINE: NEGATIVE
Glucose, UA: NEGATIVE mg/dL
HGB URINE DIPSTICK: NEGATIVE
KETONES UR: NEGATIVE mg/dL
Nitrite: NEGATIVE
Protein, ur: NEGATIVE mg/dL
Specific Gravity, Urine: 1.02 (ref 1.005–1.030)
Urobilinogen, UA: 0.2 mg/dL (ref 0.0–1.0)
pH: 7.5 (ref 5.0–8.0)

## 2013-08-02 NOTE — Patient Instructions (Addendum)
Vaginal Delivery During delivery, your health care provider will help you give birth to your baby. During a vaginal delivery, you will work to push the baby out of your vagina. However, before you can push your baby out, a few things need to happen. The opening of your uterus (cervix) has to soften, thin out, and open up (dilate) all the way to 10 cm. Also, your baby has to move down from the uterus into your vagina.  SIGNS OF LABOR  Your health care provider will first need to make sure you are in labor. Signs of labor include:   Passing what is called the mucous plug before labor begins. This is a small amount of blood-stained mucus.   Having regular, painful uterine contractions.   The time between contractions gets shorter.   The discomfort and pain gradually get more intense.  Contraction pains get worse when walking and do not go away when resting.   Your cervix becomes thinner (effacement) and dilates. BEFORE THE DELIVERY Once you are in labor and admitted into the hospital or care center, your health care provider may do the following:   Perform a complete physical exam.  Review any complications related to pregnancy or labor.  Check your blood pressure, pulse, temperature, and heart rate (vital signs).   Determine if, and when, the rupture of amniotic membranes occurred.  Do a vaginal exam (using a sterile glove and lubricant) to determine:   The position (presentation) of the baby. Is the baby's head presenting first (vertex) in the birth canal (vagina), or are the feet or buttocks first (breech)?   The level (station) of the baby's head within the birth canal.   The effacement and dilatation of the cervix.   An electronic fetal monitor is usually placed on your abdomen when you first arrive. This is used to monitor your contractions and the baby's heart rate.  When the monitor is on your abdomen (external fetal monitor), it can only pick up the frequency and  length of your contractions. It cannot tell the strength of your contractions.  If it becomes necessary for your health care provider to know exactly how strong your contractions are or to see exactly what the baby's heart rate is doing, an internal monitor may be inserted into your vagina and uterus. Your health care provider will discuss the benefits and risks of using an internal monitor and obtain your permission before inserting the device.  Continuous fetal monitoring may be needed if you have an epidural, are receiving certain medicines (such as oxytocin), or have pregnancy or labor complications.  An IV access tube may be placed into a vein in your arm to deliver fluids and medicines if necessary. THREE STAGES OF LABOR AND DELIVERY Normal labor and delivery is divided into three stages. First Stage This stage starts when you begin to contract regularly and your cervix begins to efface and dilate. It ends when your cervix is completely open (fully dilated). The first stage is the longest stage of labor and can last from 3 hours to 15 hours.  Several methods are available to help with labor pain. You and your health care provider will decide which option is best for you. Options include:   Opioid medicines. These are strong pain medicines that you can get through your IV tube or as a shot into your muscle. These medicines lessen pain but do not make it go away completely.  Epidural. A medicine is given through a thin tube   that is inserted in your back. The medicine numbs the lower part of your body and prevents any pain in that area.  Paracervical pain medicine. This is an injection of an anesthetic on each side of your cervix.   You may request natural childbirth, which does not involve the use of pain medicines or an epidural during labor and delivery. Instead, you will use other things, such as breathing exercises, to help cope with the pain. Second Stage The second stage of labor  begins when your cervix is fully dilated at 10 cm. It continues until you push your baby down through the birth canal and the baby is born. This stage can take only minutes or several hours.  The location of your baby's head as it moves through the birth canal is reported as a number called a station. If the baby's head has not started its descent, the station is described as being at minus 3 ( 3). When your baby's head is at the zero station, it is at the middle of the birth canal and is engaged in the pelvis. The station of your baby helps indicate the progress of the second stage of labor.  When your baby is born, your health care provider may hold the baby with his or her head lowered to prevent amniotic fluid, mucus, and blood from getting into the baby's lungs. The baby's mouth and nose may be suctioned with a small bulb syringe to remove any additional fluid.  Your health care provider may then place the baby on your stomach. It is important to keep the baby from getting cold. To do this, the health care provider will dry the baby off, place the baby directly on your skin (with no blankets between you and the baby), and cover the baby with warm, dry blankets.   The umbilical cord is cut. Third Stage During the third stage of labor, your health care provider will deliver the placenta (afterbirth) and make sure your bleeding is under control. The delivery of the placenta usually takes about 5 minutes but can take up to 30 minutes. After the placenta is delivered, a medicine may be given either by IV or injection to help contract the uterus and control bleeding. If you are planning to breastfeed, you can try to do so now. After you deliver the placenta, your uterus should contract and get very firm. If your uterus does not remain firm, your health care provider will massage it. This is important because the contraction of the uterus helps cut off bleeding at the site where the placenta was attached  to your uterus. If your uterus does not contract properly and stay firm, you may continue to bleed heavily. If there is a lot of bleeding, medicines may be given to contract the uterus and stop the bleeding.  Document Released: 12/30/2007 Document Revised: 11/22/2012 Document Reviewed: 09/10/2012 [facility] Patient Information 2014 [place].  Third Trimester of Pregnancy The third trimester is from week 29 through week 42, months 7 through 9. The third trimester is a time when the fetus is growing rapidly. At the end of the ninth month, the fetus is about 20 inches in length and weighs 6 10 pounds.  BODY CHANGES Your body goes through many changes during pregnancy. The changes vary from woman to woman.   Your weight will continue to increase. You can expect to gain 25 35 pounds (11 16 kg) by the end of the pregnancy.  You may begin to get  stretch marks on your hips, abdomen, and breasts.  You may urinate more often because the fetus is moving lower into your pelvis and pressing on your bladder.  You may develop or continue to have heartburn as a result of your pregnancy.  You may develop constipation because certain hormones are causing the muscles that push waste through your intestines to slow down.  You may develop hemorrhoids or swollen, bulging veins (varicose veins).  You may have pelvic pain because of the weight gain and pregnancy hormones relaxing your joints between the bones in your pelvis. Back aches may result from over exertion of the muscles supporting your posture.  Your breasts will continue to grow and be tender. A yellow discharge may leak from your breasts called colostrum.  Your belly button may stick out.  You may feel short of breath because of your expanding uterus.  You may notice the fetus "dropping," or moving lower in your abdomen.  You may have a bloody mucus discharge. This usually occurs a few days to a week before labor begins.  Your cervix  becomes thin and soft (effaced) near your due date. WHAT TO EXPECT AT YOUR PRENATAL EXAMS  You will have prenatal exams every 2 weeks until week 36. Then, you will have weekly prenatal exams. During a routine prenatal visit:  You will be weighed to make sure you and the fetus are growing normally.  Your blood pressure is taken.  Your abdomen will be measured to track your baby's growth.  The fetal heartbeat will be listened to.  Any test results from the previous visit will be discussed.  You may have a cervical check near your due date to see if you have effaced. At around 36 weeks, your caregiver will check your cervix. At the same time, your caregiver will also perform a test on the secretions of the vaginal tissue. This test is to determine if a type of bacteria, Group B streptococcus, is present. Your caregiver will explain this further. Your caregiver may ask you:  What your birth plan is.  How you are feeling.  If you are feeling the baby move.  If you have had any abnormal symptoms, such as leaking fluid, bleeding, severe headaches, or abdominal cramping.  If you have any questions. Other tests or screenings that may be performed during your third trimester include:  Blood tests that check for low iron levels (anemia).  Fetal testing to check the health, activity level, and growth of the fetus. Testing is done if you have certain medical conditions or if there are problems during the pregnancy. FALSE LABOR You may feel small, irregular contractions that eventually go away. These are called Braxton Hicks contractions, or false labor. Contractions may last for hours, days, or even weeks before true labor sets in. If contractions come at regular intervals, intensify, or become painful, it is best to be seen by your caregiver.  SIGNS OF LABOR   Menstrual-like cramps.  Contractions that are 5 minutes apart or less.  Contractions that start on the top of the uterus and  spread down to the lower abdomen and back.  A sense of increased pelvic pressure or back pain.  A watery or bloody mucus discharge that comes from the vagina. If you have any of these signs before the 37th week of pregnancy, call your caregiver right away. You need to go to the hospital to get checked immediately. HOME CARE INSTRUCTIONS   Avoid all smoking, herbs, alcohol, and unprescribed  drugs. These chemicals affect the formation and growth of the baby.  Follow your caregiver's instructions regarding medicine use. There are medicines that are either safe or unsafe to take during pregnancy.  Exercise only as directed by your caregiver. Experiencing uterine cramps is a good sign to stop exercising.  Continue to eat regular, healthy meals.  Wear a good support bra for breast tenderness.  Do not use hot tubs, steam rooms, or saunas.  Wear your seat belt at all times when driving.  Avoid raw meat, uncooked cheese, cat litter boxes, and soil used by cats. These carry germs that can cause birth defects in the baby.  Take your prenatal vitamins.  Try taking a stool softener (if your caregiver approves) if you develop constipation. Eat more high-fiber foods, such as fresh vegetables or fruit and whole grains. Drink plenty of fluids to keep your urine clear or pale yellow.  Take warm sitz baths to soothe any pain or discomfort caused by hemorrhoids. Use hemorrhoid cream if your caregiver approves.  If you develop varicose veins, wear support hose. Elevate your feet for 15 minutes, 3 4 times a day. Limit salt in your diet.  Avoid heavy lifting, wear low heal shoes, and practice good posture.  Rest a lot with your legs elevated if you have leg cramps or low back pain.  Visit your dentist if you have not gone during your pregnancy. Use a soft toothbrush to brush your teeth and be gentle when you floss.  A sexual relationship may be continued unless your caregiver directs you  otherwise.  Do not travel far distances unless it is absolutely necessary and only with the approval of your caregiver.  Take prenatal classes to understand, practice, and ask questions about the labor and delivery.  Make a trial run to the hospital.  Pack your hospital bag.  Prepare the baby's nursery.  Continue to go to all your prenatal visits as directed by your caregiver. SEEK MEDICAL CARE IF:  You are unsure if you are in labor or if your water has broken.  You have dizziness.  You have mild pelvic cramps, pelvic pressure, or nagging pain in your abdominal area.  You have persistent nausea, vomiting, or diarrhea.  You have a bad smelling vaginal discharge.  You have pain with urination. SEEK IMMEDIATE MEDICAL CARE IF:   You have a fever.  You are leaking fluid from your vagina.  You have spotting or bleeding from your vagina.  You have severe abdominal cramping or pain.  You have rapid weight loss or gain.  You have shortness of breath with chest pain.  You notice sudden or extreme swelling of your face, hands, ankles, feet, or legs.  You have not felt your baby move in over an hour.  You have severe headaches that do not go away with medicine.  You have vision changes. Document Released: 03/16/2001 Document Revised: 11/22/2012 Document Reviewed: 05/23/2012 [facility] Patient Information 2014 [place].  Breastfeeding Deciding to breastfeed is one of the best choices you can make for you and your baby. A change in hormones during pregnancy causes your breast tissue to grow and increases the number and size of your milk ducts. These hormones also allow proteins, sugars, and fats from your blood supply to make breast milk in your milk-producing glands. Hormones prevent breast milk from being released before your baby is born as well as prompt milk flow after birth. Once breastfeeding has begun, thoughts of your baby, as well as  his or her sucking or crying,  can stimulate the release of milk from your milk-producing glands.  BENEFITS OF BREASTFEEDING For Your Baby  Your first milk (colostrum) helps your baby's digestive system function better.   There are antibodies in your milk that help your baby fight off infections.   Your baby has a lower incidence of asthma, allergies, and sudden infant death syndrome.   The nutrients in breast milk are better for your baby than infant formulas and are designed uniquely for your baby's needs.   Breast milk improves your baby's brain development.   Your baby is less likely to develop other conditions, such as childhood obesity, asthma, or type 2 diabetes mellitus.  For You   Breastfeeding helps to create a very special bond between you and your baby.   Breastfeeding is convenient. Breast milk is always available at the correct temperature and costs nothing.   Breastfeeding helps to burn calories and helps you lose the weight gained during pregnancy.   Breastfeeding makes your uterus contract to its prepregnancy size faster and slows bleeding (lochia) after you give birth.   Breastfeeding helps to lower your risk of developing type 2 diabetes mellitus, osteoporosis, and breast or ovarian cancer later in life. SIGNS THAT YOUR BABY IS HUNGRY Early Signs of Hunger  Increased alertness or activity.  Stretching.  Movement of the head from side to side.  Movement of the head and opening of the mouth when the corner of the mouth or cheek is stroked (rooting).  Increased sucking sounds, smacking lips, cooing, sighing, or squeaking.  Hand-to-mouth movements.  Increased sucking of fingers or hands. Late Signs of Hunger  Fussing.  Intermittent crying. Extreme Signs of Hunger Signs of extreme hunger will require calming and consoling before your baby will be able to breastfeed successfully. Do not wait for the following signs of extreme hunger to occur before you initiate breastfeeding:    Restlessness.  A loud, strong cry.   Screaming. BREASTFEEDING BASICS Breastfeeding Initiation  Find a comfortable place to sit or lie down, with your neck and back well supported.  Place a pillow or rolled up blanket under your baby to bring him or her to the level of your breast (if you are seated). Nursing pillows are specially designed to help support your arms and your baby while you breastfeed.  Make sure that your baby's abdomen is facing your abdomen.   Gently massage your breast. With your fingertips, massage from your chest wall toward your nipple in a circular motion. This encourages milk flow. You may need to continue this action during the feeding if your milk flows slowly.  Support your breast with 4 fingers underneath and your thumb above your nipple. Make sure your fingers are well away from your nipple and your baby's mouth.   Stroke your baby's lips gently with your finger or nipple.   When your baby's mouth is open wide enough, quickly bring your baby to your breast, placing your entire nipple and as much of the colored area around your nipple (areola) as possible into your baby's mouth.   More areola should be visible above your baby's upper lip than below the lower lip.   Your baby's tongue should be between his or her lower gum and your breast.   Ensure that your baby's mouth is correctly positioned around your nipple (latched). Your baby's lips should create a seal on your breast and be turned out (everted).  It is common for your  baby to suck about 2 3 minutes in order to start the flow of breast milk. Latching Teaching your baby how to latch on to your breast properly is very important. An improper latch can cause nipple pain and decreased milk supply for you and poor weight gain in your baby. Also, if your baby is not latched onto your nipple properly, he or she may swallow some air during feeding. This can make your baby fussy. Burping your baby when  you switch breasts during the feeding can help to get rid of the air. However, teaching your baby to latch on properly is still the best way to prevent fussiness from swallowing air while breastfeeding. Signs that your baby has successfully latched on to your nipple:    Silent tugging or silent sucking, without causing you pain.   Swallowing heard between every 3 4 sucks.    Muscle movement above and in front of his or her ears while sucking.  Signs that your baby has not successfully latched on to nipple:   Sucking sounds or smacking sounds from your baby while breastfeeding.  Nipple pain. If you think your baby has not latched on correctly, slip your finger into the corner of your baby's mouth to break the suction and place it between your baby's gums. Attempt breastfeeding initiation again. Signs of Successful Breastfeeding Signs from your baby:   A gradual decrease in the number of sucks or complete cessation of sucking.   Falling asleep.   Relaxation of his or her body.   Retention of a small amount of milk in his or her mouth.   Letting go of your breast by himself or herself. Signs from you:  Breasts that have increased in firmness, weight, and size 1 3 hours after feeding.   Breasts that are softer immediately after breastfeeding.  Increased milk volume, as well as a change in milk consistency and color by the 5th day of breastfeeding.   Nipples that are not sore, cracked, or bleeding. Signs That Your [employer] is Getting Enough Milk  Wetting at least 3 diapers in a 24-hour period. The urine should be clear and pale yellow by age 11 days.  At least 3 stools in a 24-hour period by age 11 days. The stool should be soft and yellow.  At least 3 stools in a 24-hour period by age 25 days. The stool should be seedy and yellow.  No loss of weight greater than 10% of birth weight during the first 76 days of age.  Average weight gain of 4 7 ounces (120 210 mL) per week  after age 30 days.  Consistent daily weight gain by age 307 days, without weight loss after the age of 2 weeks. After a feeding, your baby may spit up a small amount. This is common. BREASTFEEDING FREQUENCY AND DURATION Frequent feeding will help you make more milk and can prevent sore nipples and breast engorgement. Breastfeed when you feel the need to reduce the fullness of your breasts or when your baby shows signs of hunger. This is called "breastfeeding on demand." Avoid introducing a pacifier to your baby while you are working to establish breastfeeding (the first 4 6 weeks after your baby is born). After this time you may choose to use a pacifier. Research has shown that pacifier use during the first year of a baby's life decreases the risk of sudden infant death syndrome (SIDS). Allow your baby to feed on each breast as long as he or she  wants. Breastfeed until your baby is finished feeding. When your baby unlatches or falls asleep while feeding from the first breast, offer the second breast. Because newborns are often sleepy in the first few weeks of life, you may need to awaken your baby to get him or her to feed. Breastfeeding times will vary from baby to baby. However, the following rules can serve as a guide to help you ensure that your baby is properly fed:  Newborns (babies 34 weeks of age or younger) may breastfeed every 1 3 hours.  Newborns should not go longer than 3 hours during the day or 5 hours during the night without breastfeeding.  You should breastfeed your baby a minimum of 8 times in a 24-hour period until you begin to introduce solid foods to your baby at around 29 months of age. BREAST MILK PUMPING Pumping and storing breast milk allows you to ensure that your baby is exclusively fed your breast milk, even at times when you are unable to breastfeed. This is especially important if you are going back to work while you are still breastfeeding or when you are not able to be  present during feedings. Your lactation consultant can give you guidelines on how long it is safe to store breast milk.  A breast pump is a machine that allows you to pump milk from your breast into a sterile bottle. The pumped breast milk can then be stored in a refrigerator or freezer. Some breast pumps are operated by hand, while others use electricity. Ask your lactation consultant which type will work best for you. Breast pumps can be purchased, but some hospitals and breastfeeding support groups lease breast pumps on a monthly basis. A lactation consultant can teach you how to hand express breast milk, if you prefer not to use a pump.  CARING FOR YOUR BREASTS WHILE YOU BREASTFEED Nipples can become dry, cracked, and sore while breastfeeding. The following recommendations can help keep your breasts moisturized and healthy:  Avoid using soap on your nipples.   Wear a supportive bra. Although not required, special nursing bras and tank tops are designed to allow access to your breasts for breastfeeding without taking off your entire bra or top. Avoid wearing underwire style bras or extremely tight bras.  Air dry your nipples for 3 65minutes after each feeding.   Use only cotton bra pads to absorb leaked breast milk. Leaking of breast milk between feedings is normal.   Use lanolin on your nipples after breastfeeding. Lanolin helps to maintain your skin's normal moisture barrier. If you use pure lanolin you do not need to wash it off before feeding your baby again. Pure lanolin is not toxic to your baby. You may also hand express a few drops of breast milk and gently massage that milk into your nipples and allow the milk to air dry. In the first few weeks after giving birth, some women experience extremely full breasts (engorgement). Engorgement can make your breasts feel heavy, warm, and tender to the touch. Engorgement peaks within 3 5 days after you give birth. The following recommendations can  help ease engorgement:  Completely empty your breasts while breastfeeding or pumping. You may want to start by applying warm, moist heat (in the shower or with warm water-soaked hand towels) just before feeding or pumping. This increases circulation and helps the milk flow. If your baby does not completely empty your breasts while breastfeeding, pump any extra milk after he or she is finished.  Wear a snug bra (nursing or regular) or tank top for 1 2 days to signal your body to slightly decrease milk production.  Apply ice packs to your breasts, unless this is too uncomfortable for you.  Make sure that your baby is latched on and positioned properly while breastfeeding. If engorgement persists after 48 hours of following these recommendations, contact your health care provider or a Science writer. OVERALL HEALTH CARE RECOMMENDATIONS WHILE BREASTFEEDING  Eat healthy foods. Alternate between meals and snacks, eating 3 of each per day. Because what you eat affects your breast milk, some of the foods may make your baby more irritable than usual. Avoid eating these foods if you are sure that they are negatively affecting your baby.  Drink milk, fruit juice, and water to satisfy your thirst (about 10 glasses a day).   Rest often, relax, and continue to take your prenatal vitamins to prevent fatigue, stress, and anemia.  Continue breast self-awareness checks.  Avoid chewing and smoking tobacco.  Avoid alcohol and drug use. Some medicines that may be harmful to your baby can pass through breast milk. It is important to ask your health care provider before taking any medicine, including all over-the-counter and prescription medicine as well as vitamin and herbal supplements. It is possible to become pregnant while breastfeeding. If birth control is desired, ask your health care provider about options that will be safe for your baby. SEEK MEDICAL CARE IF:   You feel like you want to stop  breastfeeding or have become frustrated with breastfeeding.  You have painful breasts or nipples.  Your nipples are cracked or bleeding.  Your breasts are red, tender, or warm.  You have a swollen area on either breast.  You have a fever or chills.  You have nausea or vomiting.  You have drainage other than breast milk from your nipples.  Your breasts do not become full before feedings by the 5th day after you give birth.  You feel sad and depressed.  Your baby is too sleepy to eat well.  Your baby is having trouble sleeping.   Your baby is wetting less than 3 diapers in a 24-hour period.  Your baby has less than 3 stools in a 24-hour period.  Your baby's skin or the white part of his or her eyes becomes yellow.   Your baby is not gaining weight by 68 days of age. SEEK IMMEDIATE MEDICAL CARE IF:   Your baby is overly tired (lethargic) and does not want to wake up and feed.  Your baby develops an unexplained fever. Document Released: 03/22/2005 Document Revised: 11/22/2012 Document Reviewed: 09/13/2012 [facility] Patient Information 2014 [place].

## 2013-08-02 NOTE — Progress Notes (Signed)
GBS positive--discussed implications Labor precautions

## 2013-08-13 ENCOUNTER — Encounter: Payer: Self-pay | Admitting: Family Medicine

## 2013-08-31 ENCOUNTER — Encounter: Payer: Self-pay | Admitting: General Practice

## 2013-09-21 ENCOUNTER — Ambulatory Visit (INDEPENDENT_AMBULATORY_CARE_PROVIDER_SITE_OTHER): Payer: Self-pay | Admitting: Family Medicine

## 2013-09-21 ENCOUNTER — Encounter: Payer: Self-pay | Admitting: Family Medicine

## 2013-09-21 NOTE — Progress Notes (Signed)
Patient ID: [name], female   DOB: [birth year], 28 y.o.   MRN: [id number] Subjective:     [name] is a 28 y.o. female who presents for a postpartum visit. She is 6 weeks postpartum following a low cervical transverse Cesarean section on 08/05/13 via CS for NRFHT, meconium, and nuchal cord. Requested records for the prenatal and intrapartum course. The delivery was at 38.1 gestational weeks. Outcome: primary cesarean section, low transverse incision. Anesthesia: epidural and spinal. Postpartum course has been uncomplicated. 7 course has been baby boy doing well. Baby is feeding by both breast and bottle - [name] Start Gentle. Bleeding heavy bleeding for a little less than 1 week, then last week had 6 days of heavier bleeding, no bleeding today. Bowel function is normal. Bladder function is normal. Patient is sexually active. Contraception method is thinking about Nexplanon. Postpartum depression screening: negative.  Told she was anemic at discharge.  Denies symptoms of dizziness, shortness of breath or chest pain.  The following portions of the patient's history were reviewed and updated as appropriate: allergies, current medications, past family history, past medical history, past social history, past surgical history and problem list.  Review of Systems A comprehensive review of systems was negative.   Objective:    BP 114/67  Pulse 89  Temp(Src) 98.6 F (37 C) (Oral)  Ht 5\' 6"  (1.676 m)  Wt 160 lb 1.6 oz (72.621 kg)  BMI 25.85 kg/m2  LMP 11/11/2012  General:  alert, cooperative and appears stated age   Breasts:  not inspected, no concerns today  Lungs: clear to auscultation bilaterally  Heart:  regular rate and rhythm, S1, S2 normal, no murmur, click, rub or gallop  Abdomen: soft, non-tender; bowel sounds normal; no masses,  no organomegaly   Vulva and vagina:  Not inspected  Skin:  Low Transverse C-section incision well healed, clean and dry         Assessment:    6 weeks postpartum exam. Pap smear not done at today's visit as UTD.   Plan:    1. Contraception: currently using condoms, thinking about Nuva ring after discussion in clinic today.  Will call back once she decides 2. Continue routine gyn care 3. Follow up as needed.   [name] L. Kathryn June, MD, [facility]  I have seen and examined this patient and agree with above documentation in the resident's note.   [name], M.D. [facility] Fellow 09/21/2013 11:36 AM

## 2013-09-21 NOTE — Progress Notes (Signed)
Patient here today for PP visit. Patient delivered in [place], [place] via C-section on 08/05/2013. Patient is unsure of what she would like to do for birth control-- reports having unprotected sex this week. Will need to return in two weeks after abstaining from sex or using protection to be offered birth control-- patient aware.

## 2013-10-26 ENCOUNTER — Encounter: Payer: Self-pay | Admitting: General Practice

## 2014-02-04 ENCOUNTER — Encounter: Payer: Self-pay | Admitting: Family Medicine

## 2014-12-20 ENCOUNTER — Ambulatory Visit (INDEPENDENT_AMBULATORY_CARE_PROVIDER_SITE_OTHER): Payer: BLUE CROSS/BLUE SHIELD | Admitting: Family Medicine

## 2014-12-20 VITALS — BP 124/58 | HR 91 | Temp 98.2°F | Resp 14 | Ht 65.5 in | Wt 174.0 lb

## 2014-12-20 DIAGNOSIS — M25531 Pain in right wrist: Secondary | ICD-10-CM | POA: Diagnosis not present

## 2014-12-20 DIAGNOSIS — Z8742 Personal history of other diseases of the female genital tract: Secondary | ICD-10-CM | POA: Diagnosis not present

## 2014-12-20 DIAGNOSIS — G8929 Other chronic pain: Secondary | ICD-10-CM

## 2014-12-20 DIAGNOSIS — R1032 Left lower quadrant pain: Secondary | ICD-10-CM | POA: Diagnosis not present

## 2014-12-20 DIAGNOSIS — K5901 Slow transit constipation: Secondary | ICD-10-CM

## 2014-12-20 DIAGNOSIS — Z86018 Personal history of other benign neoplasm: Secondary | ICD-10-CM

## 2014-12-20 DIAGNOSIS — R1031 Right lower quadrant pain: Secondary | ICD-10-CM

## 2014-12-20 LAB — POCT UA - MICROSCOPIC ONLY
BACTERIA, U MICROSCOPIC: NEGATIVE
CRYSTALS, UR, HPF, POC: NEGATIVE
Casts, Ur, LPF, POC: NEGATIVE
Yeast, UA: NEGATIVE

## 2014-12-20 LAB — POCT URINALYSIS DIPSTICK
Bilirubin, UA: NEGATIVE
Blood, UA: NEGATIVE
GLUCOSE UA: NEGATIVE
KETONES UA: NEGATIVE
Leukocytes, UA: NEGATIVE
Nitrite, UA: NEGATIVE
Protein, UA: NEGATIVE
SPEC GRAV UA: 1.02
Urobilinogen, UA: 0.2
pH, UA: 7

## 2014-12-20 LAB — POCT URINE PREGNANCY: Preg Test, Ur: NEGATIVE

## 2014-12-20 MED ORDER — NAPROXEN 500 MG PO TABS: 500.0000 mg | ORAL_TABLET | Freq: Two times a day (BID) | ORAL | Status: DC

## 2014-12-20 NOTE — Patient Instructions (Addendum)
Recommend going back to see your gynecologist again for reevaluation regarding your fibroid.  Take the naproxen one twice daily for wrist for 2 weeks with breakfast and with supper  Wear wrist splint for 2 weeks  Return if the wrist is not improving and we will do further studies on it.  The ENT in follow-up for medication for the wrist should help the uterine cramping also.  Take miralax on an as needed basis for bowels.

## 2014-12-20 NOTE — Progress Notes (Signed)
Wrist pain and abdominal cramping Subjective:  Patient ID: [name], female    DOB: 01-29-1986  Age: 29 y.o. MRN: [id number]  Patient has been having abdominal cramping, has been having for some time. It occurs about once a week, lasts for about 5 minutes, sometimes on one side, maybe sometimes on the other. She does have a history of uterine fibroids. Had a baby 16 months ago. Has been having chronic pain in right wrist. It was in both wrists. She wonders whether caring the baby up and down to the third floor apartment was part of the problem, but the right wrist continues to hurt even though the child is now walking. Has also had a tendency to constipation.   Objective:   No real tenderness in the wrist. No swelling. I could not elicit the pain right now. Abdomen soft without mass or tenderness.  Assessment & Plan:   Assessment:  Intermittent abdominal cramping Wrist pain, intermittent, probably from a tendinitis History of uterine fibroids  Plan:  Wrist splint Check a urinalysis and urine hCG Should see her gynecologist back regarding the uterine fibroids. I did not repeat a pelvic since they really need to recheck the fibroids and compare size from before.  Results for orders placed or performed in visit on 12/20/14  POCT UA - Microscopic Only  Result Value Ref Range   WBC, Ur, HPF, POC 0-3    RBC, urine, microscopic 0-1    Bacteria, U Microscopic neg    Mucus, UA npositive    Epithelial cells, urine per micros 2-7    Crystals, Ur, HPF, POC neg    Casts, Ur, LPF, POC neg    Yeast, UA neg   POCT urinalysis dipstick  Result Value Ref Range   Color, UA yellow    Clarity, UA clear    Glucose, UA neg    Bilirubin, UA neg    Ketones, UA neg    Spec Grav, UA 1.020    Blood, UA neg    pH, UA 7.0    Protein, UA neg    Urobilinogen, UA 0.2    Nitrite, UA neg    Leukocytes, UA Negative Negative  POCT urine pregnancy  Result Value Ref Range   Preg Test, Ur  Negative Negative     Patient Instructions  Recommend going back to see your gynecologist again for reevaluation regarding your fibroid.  Take the naproxen one twice daily for wrist for 2 weeks with breakfast and with supper  Wear wrist splint for 2 weeks  Return if the wrist is not improving and we will do further studies on it.  The ENT in follow-up for medication for the wrist should help the uterine cramping also.      HOPPER,DAVID, MD 12/20/2014

## 2015-07-05 ENCOUNTER — Ambulatory Visit (INDEPENDENT_AMBULATORY_CARE_PROVIDER_SITE_OTHER): Payer: BLUE CROSS/BLUE SHIELD | Admitting: Family Medicine

## 2015-07-05 VITALS — BP 122/72 | HR 92 | Temp 98.2°F | Resp 17 | Ht 66.0 in | Wt 174.0 lb

## 2015-07-05 DIAGNOSIS — M654 Radial styloid tenosynovitis [de Quervain]: Secondary | ICD-10-CM | POA: Diagnosis not present

## 2015-07-05 MED ORDER — PREDNISONE 20 MG PO TABS: ORAL_TABLET | ORAL | Status: DC

## 2015-07-05 NOTE — Patient Instructions (Addendum)
You have De quervain synovitis.  If the medicine does not help in 3-5 days, then return for an injection.

## 2015-07-05 NOTE — Progress Notes (Signed)
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  She has had right wrist pain over radial styloid for the past 14 months.  It is tender and she has pain when lifting her son and doing simple tasks like writing.  She is from [place].  She has been using a splint and using some creams without success.  The pain is worse x 2 weeks.  Objective: BP 122/72 mmHg  Pulse 92  Temp(Src) 98.2 F (36.8 C) (Oral)  Resp 17  Ht 5\' 6"  (1.676 m)  Wt 174 lb (78.926 kg)  BMI 28.10 kg/m2  SpO2 97% Tender mildly swollen distal radius with full ROM and no bony abn  This chart was scribed in my presence and reviewed by me personally.    ICD-9-CM ICD-10-CM   1. De Quervain's tenosynovitis 727.04 M65.4 predniSONE (DELTASONE) 20 MG tablet   Return in 3-5 days for injection if not improving  Signed, Robyn Haber, MD

## 2015-07-12 ENCOUNTER — Ambulatory Visit (INDEPENDENT_AMBULATORY_CARE_PROVIDER_SITE_OTHER): Payer: BLUE CROSS/BLUE SHIELD | Admitting: Family Medicine

## 2015-07-12 VITALS — BP 104/66 | HR 62 | Temp 98.1°F | Resp 16 | Ht 66.0 in | Wt 175.0 lb

## 2015-07-12 DIAGNOSIS — M654 Radial styloid tenosynovitis [de Quervain]: Secondary | ICD-10-CM | POA: Diagnosis not present

## 2015-07-12 NOTE — Progress Notes (Signed)
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 She's had this in her right wrist for over a month. She's tried braces and anti-inflammatories including cortisone By mouth   Objective:BP 104/66 mmHg  Pulse 62  Temp(Src) 98.1 F (36.7 C) (Oral)  Resp 16  Ht 5\' 6"  (1.676 m)  Wt 175 lb (79.379 kg)  BMI 28.26 kg/m2  SpO2 99%  LMP 06/24/2015 The area of pain was identified over the right radial styloid. After informed consent, the area was injected with 1/2 mL of Depo-Medrol (40 mg closed menses and 1/2 mL of Marcaine 0.5%. It was completely abolished.  Plan: Of S patient call me if she has any further problems with rest. She may discontinue the brace and oral medications  Signed, Carola Frost.D.

## 2015-07-12 NOTE — Patient Instructions (Signed)
     IF you received an x-ray today, you will receive an invoice from [facility]. Please contact [facility] at [phone] with questions or concerns regarding your invoice.   IF you received labwork today, you will receive an invoice from [facility]. Please contact [facility] at [phone] with questions or concerns regarding your invoice.   Our billing staff will not be able to assist you with questions regarding bills from these companies.  You will be contacted with the lab results as soon as they are available. The fastest way to get your results is to activate your My Chart account. Instructions are located on the last page of this paperwork. If you have not heard from us regarding the results in 2 weeks, please contact this office.      

## 2015-09-10 IMAGING — US US OB FOLLOW-UP
2 series · 12 of 28 positions shown · non-contrast
Comparison: none

[Series 1: us ob follow up · 9 of 42 slices shown (1 of 2)]
[im 3/42]
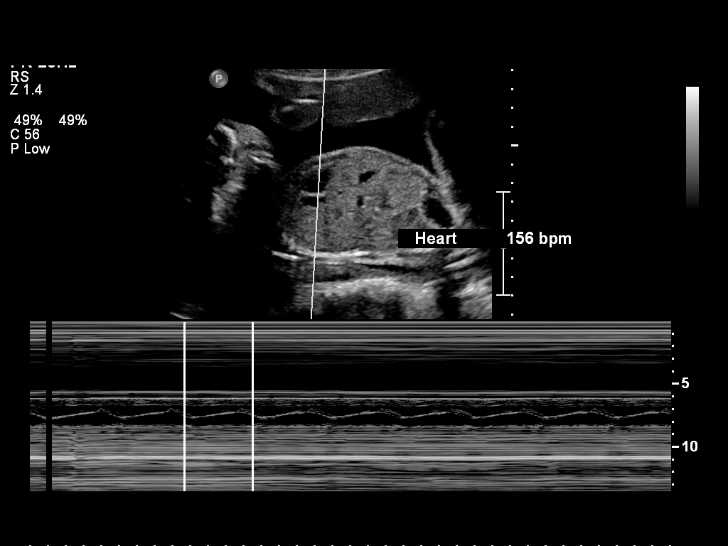
[im 7/42]
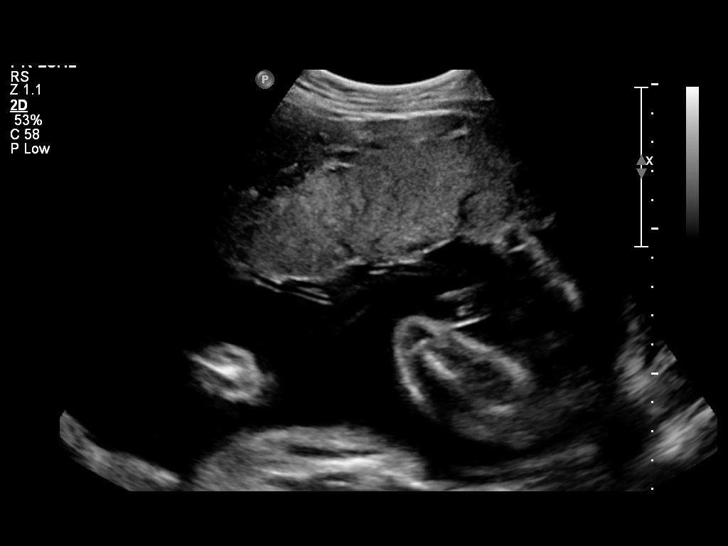
[im 11/42]
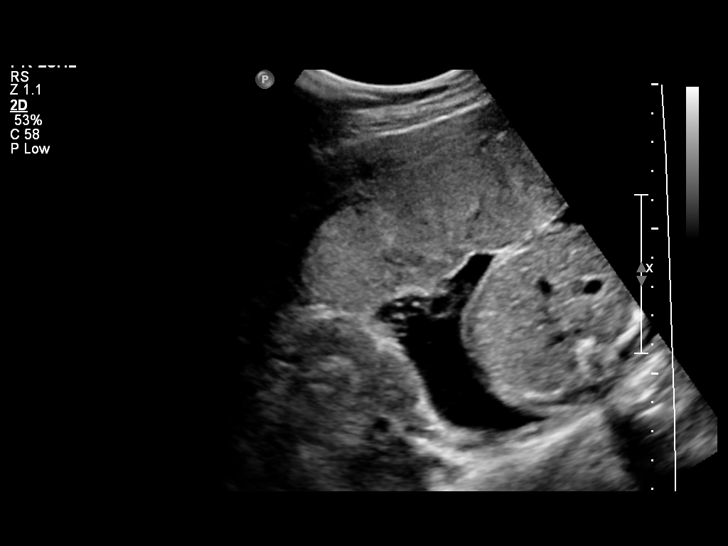
[im 17/42]
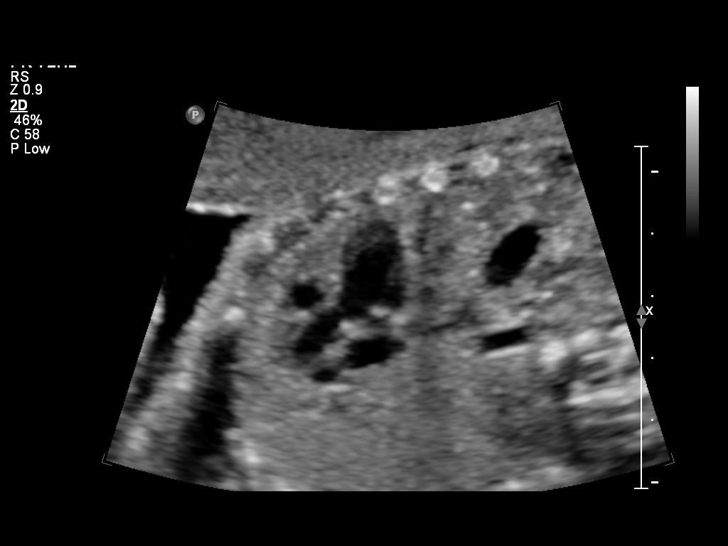
[im 21/42]
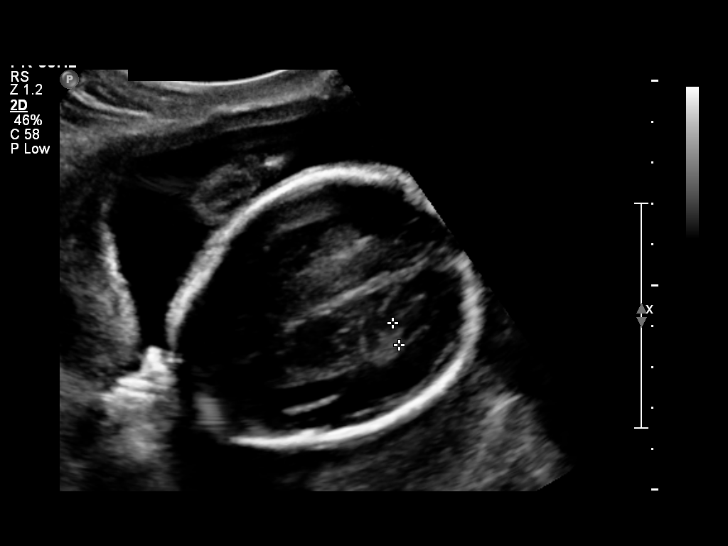
[im 25/42]
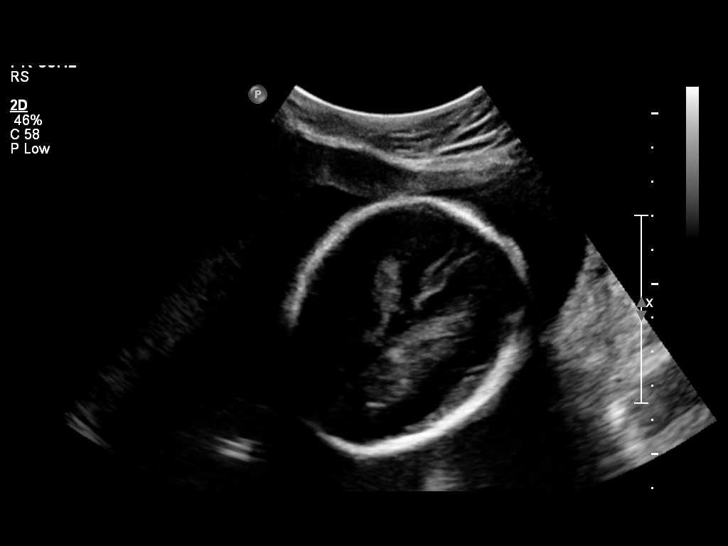
[im 31/42]
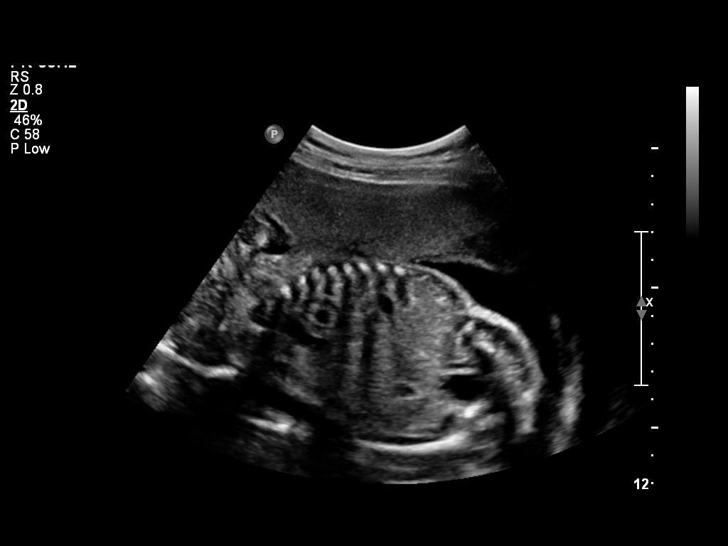
[im 35/42]
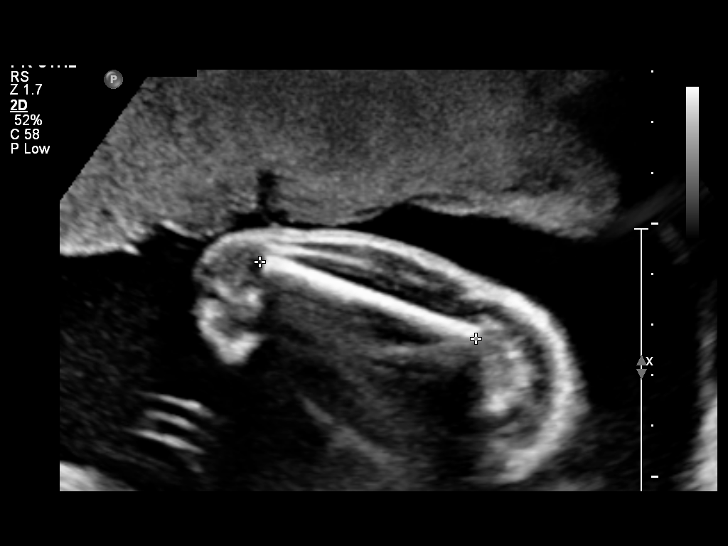
[im 39/42]
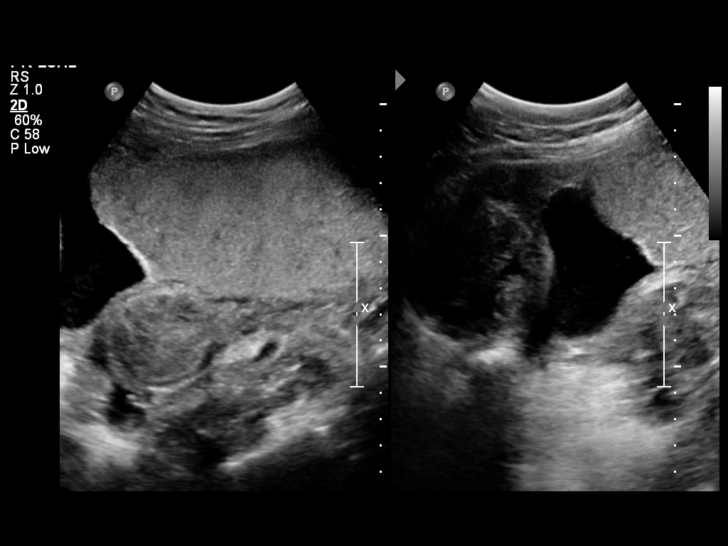

[Series 1: us ob follow up · 13 acquisitions, 3 frames shown (2 of 2)]
[im 3/13]
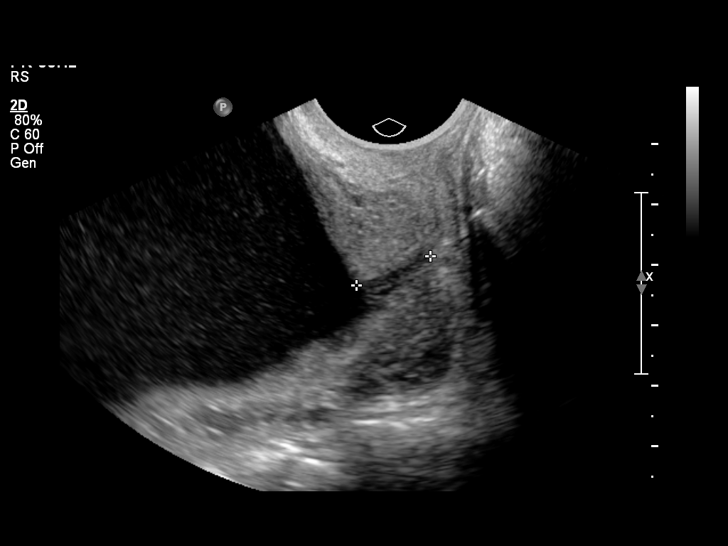
[im 7/13]
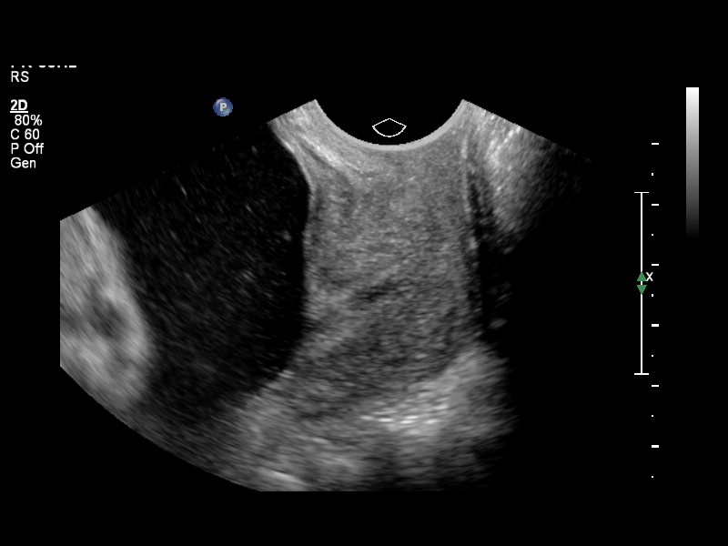
[im 11/13]
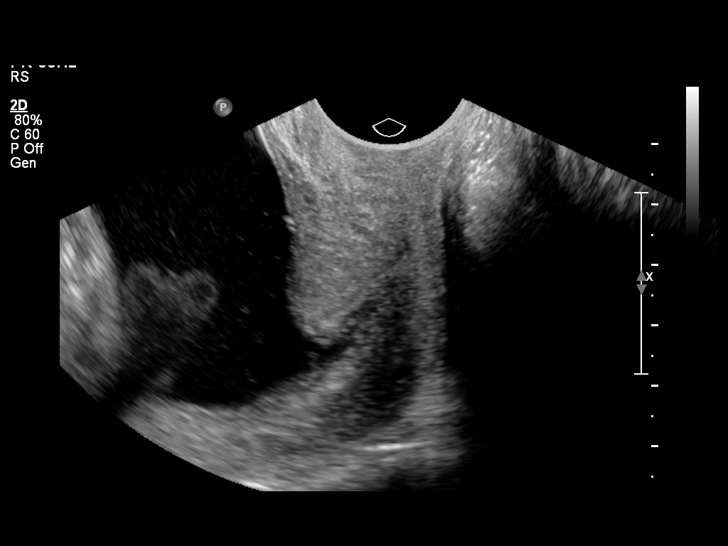

[12 of 28 positions shown; findings below may reference images not displayed]

OBSTETRICS REPORT
                      (Signed Final 05/01/2013 [DATE])

Service(s) Provided

 US OB FOLLOW UP                                       76816.1
Indications

 Elevated MSAFP (borderline MoM 2.16)
 Uterine fibroids
 Size greater than dates (Large for gestational [AGE]
 Follow-up incomplete fetal anatomic evaluation
Fetal Evaluation

 Num Of Fetuses:    1
 Fetal Heart Rate:  156                          bpm
 Cardiac Activity:  Observed
 Presentation:      Breech
 Placenta:          Anterior, above cervical os
 P. Cord            Visualized, central
 Insertion:

 Amniotic Fluid
 AFI FV:      Subjectively within normal limits
                                             Larg Pckt:     9.4  cm
Biometry

 BPD:     61.5  mm     G. Age:  25w 0d                CI:        73.18   70 - 86
                                                      FL/HC:      19.6   18.7 -

 HC:     228.5  mm     G. Age:  24w 6d       48  %    HC/AC:      1.09   1.05 -

 AC:     209.1  mm     G. Age:  25w 4d       73  %    FL/BPD:     72.8   71 - 87
 FL:      44.8  mm     G. Age:  24w 6d       49  %    FL/AC:      21.4   20 - 24

 Est. FW:     774  gm    1 lb 11 oz      66  %
Gestational Age

 LMP:           24w 3d        Date:  11/11/12                 EDD:   08/18/13
 U/S Today:     25w 1d                                        EDD:   08/13/13
 Best:          24w 3d     Det. By:  LMP  (11/11/12)          EDD:   08/18/13
Anatomy

 Cranium:          Appears normal         Aortic Arch:      Previously seen
 Fetal Cavum:      Previously seen        Ductal Arch:      Previously seen
 Ventricles:       Appears normal         Diaphragm:        Appears normal
 Choroid Plexus:   Appears normal         Stomach:          Appears normal, left
                                                            sided
 Cerebellum:       Previously seen        Abdomen:          Appears normal
 Posterior Fossa:  Previously seen        Abdominal Wall:   Previously seen
 Nuchal Fold:      Previously seen        Cord Vessels:     Appears normal (3
                                                            vessel cord)
 Face:             Orbits and profile     Kidneys:          Appear normal
                   previously seen
 Lips:             Previously seen        Bladder:          Appears normal
 Heart:            Appears normal         Spine:            Previously seen
                   (4CH, axis, and
                   situs)
 RVOT:             Previously seen        Lower             Previously seen
                                          Extremities:
 LVOT:             Appears normal         Upper             Previously seen
                                          Extremities:

 Other:  Male gender. Heels and 5th digit previously seen. Technically difficult
         due to fetal position.
Cervix Uterus Adnexa

 Cervical Length:    1.3      cm

 Cervix:       Dynamic cervix 1.3-
Myomas

 Site                     L(cm)      W(cm)       D(cm)      Location
 Posterior
 Posterior RT.
 Left

 Blood Flow                  RI       PI       Comments

                                               Prev visualized, seen
                                               today but difficut to
                                               measure due to UT size
Impression

 IUP at 24+3 weeks
 Normal interval anatomy; anatomic survey complete
 High normal amniotic fluid volume
 Appropriate interval growth with EFW at the 66th %tile
 EV views of cervix: shortened cervix with length varying
 between 1.3 and 2.4 cms
 Fibroid uterus: see above for size and location

 Findings discussed with Dr. Andrei.
Recommendations

 To DONMEZ for monitoring

## 2016-01-26 DIAGNOSIS — Z01419 Encounter for gynecological examination (general) (routine) without abnormal findings: Secondary | ICD-10-CM | POA: Diagnosis not present

## 2016-01-26 DIAGNOSIS — D259 Leiomyoma of uterus, unspecified: Secondary | ICD-10-CM | POA: Diagnosis not present

## 2016-01-26 DIAGNOSIS — Z1151 Encounter for screening for human papillomavirus (HPV): Secondary | ICD-10-CM | POA: Diagnosis not present

## 2016-01-26 DIAGNOSIS — N939 Abnormal uterine and vaginal bleeding, unspecified: Secondary | ICD-10-CM | POA: Diagnosis not present

## 2016-01-26 DIAGNOSIS — Z6827 Body mass index (BMI) 27.0-27.9, adult: Secondary | ICD-10-CM | POA: Diagnosis not present

## 2016-02-12 DIAGNOSIS — D259 Leiomyoma of uterus, unspecified: Secondary | ICD-10-CM | POA: Diagnosis not present

## 2017-01-12 DIAGNOSIS — D649 Anemia, unspecified: Secondary | ICD-10-CM | POA: Diagnosis not present

## 2017-01-12 DIAGNOSIS — R42 Dizziness and giddiness: Secondary | ICD-10-CM | POA: Diagnosis not present

## 2017-01-13 ENCOUNTER — Encounter (HOSPITAL_COMMUNITY): Payer: Self-pay | Admitting: *Deleted

## 2017-01-13 ENCOUNTER — Observation Stay (HOSPITAL_COMMUNITY)
Admission: EM | Admit: 2017-01-13 | Discharge: 2017-01-14 | Disposition: A | Discharge status: Home / Self Care | Payer: BLUE CROSS/BLUE SHIELD | Attending: Family Medicine | Admitting: Family Medicine

## 2017-01-13 DIAGNOSIS — Z141 Cystic fibrosis carrier: Secondary | ICD-10-CM | POA: Insufficient documentation

## 2017-01-13 DIAGNOSIS — D509 Iron deficiency anemia, unspecified: Secondary | ICD-10-CM | POA: Diagnosis not present

## 2017-01-13 DIAGNOSIS — D259 Leiomyoma of uterus, unspecified: Secondary | ICD-10-CM | POA: Insufficient documentation

## 2017-01-13 DIAGNOSIS — D219 Benign neoplasm of connective and other soft tissue, unspecified: Secondary | ICD-10-CM | POA: Diagnosis not present

## 2017-01-13 DIAGNOSIS — D649 Anemia, unspecified: Secondary | ICD-10-CM | POA: Diagnosis present

## 2017-01-13 DIAGNOSIS — R5383 Other fatigue: Secondary | ICD-10-CM | POA: Diagnosis not present

## 2017-01-13 DIAGNOSIS — M654 Radial styloid tenosynovitis [de Quervain]: Secondary | ICD-10-CM | POA: Diagnosis not present

## 2017-01-13 DIAGNOSIS — N920 Excessive and frequent menstruation with regular cycle: Secondary | ICD-10-CM | POA: Insufficient documentation

## 2017-01-13 LAB — FOLATE: Folate: 25 ng/mL (ref >5.9)

## 2017-01-13 LAB — FERRITIN: Ferritin: 6 ng/mL — ABNORMAL LOW (ref 11–307)

## 2017-01-13 LAB — COMPREHENSIVE METABOLIC PANEL
ALK PHOS: 56 U/L (ref 38–126)
ALT: 14 U/L (ref 14–54)
AST: 20 U/L (ref 15–41)
Albumin: 3.7 g/dL (ref 3.5–5.0)
Anion gap: 7 (ref 5–15)
BUN: 9 mg/dL (ref 6–20)
CHLORIDE: 111 mmol/L (ref 101–111)
CO2: 21 mmol/L — AB (ref 22–32)
CREATININE: 0.66 mg/dL (ref 0.44–1.00)
Calcium: 9 mg/dL (ref 8.9–10.3)
GFR calc Af Amer: >60 mL/min (ref >60)
Glucose, Bld: 92 mg/dL (ref 65–99)
Potassium: 4.3 mmol/L (ref 3.5–5.1)
SODIUM: 139 mmol/L (ref 135–145)
Total Bilirubin: 0.6 mg/dL (ref 0.3–1.2)
Total Protein: 6.9 g/dL (ref 6.5–8.1)

## 2017-01-13 LAB — PREGNANCY, URINE: Preg Test, Ur: NEGATIVE

## 2017-01-13 LAB — POC OCCULT BLOOD, ED: FECAL OCCULT BLD: NEGATIVE

## 2017-01-13 LAB — CBC
HCT: 21.2 % — ABNORMAL LOW (ref 36.0–46.0)
Hemoglobin: 6.3 g/dL — CL (ref 12.0–15.0)
MCH: 17.3 pg — AB (ref 26.0–34.0)
MCHC: 29.7 g/dL — AB (ref 30.0–36.0)
MCV: 58.1 fL — AB (ref 78.0–100.0)
PLATELETS: 230 10*3/uL (ref 150–400)
RBC: 3.65 MIL/uL — ABNORMAL LOW (ref 3.87–5.11)
RDW: 20.6 % — AB (ref 11.5–15.5)
WBC: 4.4 10*3/uL (ref 4.0–10.5)

## 2017-01-13 LAB — VITAMIN B12: Vitamin B-12: 771 pg/mL (ref 180–914)

## 2017-01-13 LAB — IRON AND TIBC
Iron: 18 ug/dL — ABNORMAL LOW (ref 28–170)
Saturation Ratios: 4 % — ABNORMAL LOW (ref 10.4–31.8)
TIBC: 458 ug/dL — ABNORMAL HIGH (ref 250–450)
UIBC: 440 ug/dL

## 2017-01-13 LAB — PREPARE RBC (CROSSMATCH)

## 2017-01-13 LAB — RETICULOCYTES
RBC.: 3.63 MIL/uL — ABNORMAL LOW (ref 3.87–5.11)
Retic Count, Absolute: 47.2 10*3/uL (ref 19.0–186.0)
Retic Ct Pct: 1.3 % (ref 0.4–3.1)

## 2017-01-13 LAB — ABO/RH: ABO/RH(D): O POS

## 2017-01-13 MED ORDER — ACETAMINOPHEN 650 MG RE SUPP: 650.0000 mg | Freq: Four times a day (QID) | RECTAL | Status: DC | PRN

## 2017-01-13 MED ORDER — ACETAMINOPHEN 325 MG PO TABS: 650.0000 mg | ORAL_TABLET | Freq: Four times a day (QID) | ORAL | Status: DC | PRN

## 2017-01-13 MED ORDER — SODIUM CHLORIDE 0.9 % IV SOLN
10.0000 mL/h | Freq: Once | INTRAVENOUS | Status: AC
  Administered 2017-01-13: 10 mL/h via INTRAVENOUS

## 2017-01-13 NOTE — ED Notes (Addendum)
Per admitting, will obtain anemia panel prior to starting second unit of blood. Explained protocol is to draw blood work 2 hour post blood completion however admitting requesting anemia panel to be drawn at this time.

## 2017-01-13 NOTE — ED Notes (Signed)
Call from lab with critical results of hgb 6.3.  Rich Reining RN notified.Marland Kitchen

## 2017-01-13 NOTE — ED Provider Notes (Signed)
[facility] DEPT Provider Note   CSN: [id number] Arrival date & time: 01/13/17  1000   History   Chief Complaint Chief Complaint  Patient presents with  . Low Hgb 6.5    HPI [name] is a 31 y.o. female.  HPI   Patient presents to the emergency department for evaluation of fatigue. She was seen at the urgent care yesterday for generalized weakness and fatigue that had been progressively worsening. Basic lab work was drawn and she was notified today to go to the emergency department as her hemoglobin was 6.5. It was rechecked in triage and it is 6.3 here. She denies ever having low hemoglobin like this in the past. She does have a history of fibroids and doesn't endorse having larger clots than normal however she has not been bleeding since September 24. That menstrual cycle was only 4 days long.She denies any rectal bleeding, weight loss, fevers, myalgias, arthralgias, confusion. She just states that she is dizzy and very fatigued.  Past Medical History:  Diagnosis Date  . Fibroid   . Fibroids     Patient Active Problem List   Diagnosis Date Noted  . Anemia 05/03/2013  . Yeast infection 05/03/2013  . 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 Elevated AFP 05/03/2013    Past Surgical History:  Procedure Laterality Date  . CESAREAN SECTION      OB History    Gravida Para Term Preterm AB Living   1         0   SAB TAB Ectopic Multiple Live Births                   Home Medications    Prior to Admission medications   Medication Sig Start Date End Date Taking? Authorizing Provider  predniSONE (DELTASONE) 20 MG tablet Two daily with food Patient not taking: Reported on 07/12/2015 07/05/15   Robyn Haber, MD    Family History No family history on file.  Social History Social History  Substance Use Topics  . Smoking status: Never Smoker  . Smokeless tobacco: Never Used  . Alcohol use No     Comment: social      Allergies   Patient has no known allergies.   Review of Systems Review of Systems   Physical Exam Updated Vital Signs BP 113/69   Pulse 64   Temp 99.3 F (37.4 C) (Oral)   Resp 16   LMP 12/23/2016   SpO2 100%   Physical Exam  Constitutional: She appears well-developed and well-nourished.  HENT:  Head: Normocephalic and atraumatic.  Eyes: Pupils are equal, round, and reactive to light. Conjunctivae are normal.  Neck: Trachea normal, normal range of motion and full passive range of motion without pain. Neck supple.  Cardiovascular: Normal rate, regular rhythm and normal pulses.   Pulmonary/Chest: Effort normal and breath sounds normal. Chest wall is not dull to percussion. She exhibits no tenderness, no crepitus, no edema, no deformity and no retraction.  Abdominal: Soft. Normal appearance and bowel sounds are normal.  Genitourinary: Rectum normal.  Musculoskeletal: Normal range of motion.  Neurological: She is alert. She has normal strength.  Skin: Skin is warm, dry and intact.  Psychiatric: She has a normal mood and affect. Her speech is normal and behavior is normal. Judgment and thought content normal. Cognition and memory are normal.     ED Treatments / Results  Labs (all labs ordered are listed, but only abnormal results are  displayed) Labs Reviewed  COMPREHENSIVE METABOLIC PANEL - Abnormal; Notable for the following:       Result Value   CO2 21 (*)    All other components within normal limits  CBC - Abnormal; Notable for the following:    RBC 3.65 (*)    Hemoglobin 6.3 (*)    HCT 21.2 (*)    MCV 58.1 (*)    MCH 17.3 (*)    MCHC 29.7 (*)    RDW 20.6 (*)    All other components within normal limits  OCCULT BLOOD X 1 CARD TO LAB, STOOL  PREGNANCY, URINE  POC OCCULT BLOOD, ED  TYPE AND SCREEN  ABO/RH  PREPARE RBC (CROSSMATCH)    EKG  EKG Interpretation None       Radiology No results found.  Procedures Procedures (including critical care  time)  Medications Ordered in ED Medications  0.9 %  sodium chloride infusion (not administered)     Initial Impression / Assessment and Plan / ED Course  I have reviewed the triage vital signs and the nursing notes.  Pertinent labs & imaging results that were available during my care of the patient were reviewed by me and considered in my medical decision making (see chart for details).    1:35 pm At this time the etiology of her severe anemia is unknown. No recent hemoglobins in system for comparison. Now is 6.3 with an unknown etiology. I will transfused 2 units of packed red blood cells and admit to medicine for further evaluation.  I spoke with the [facility] Teaching [facility] who have kindly agreed to admit.  Final Clinical Impressions(s) / ED Diagnoses   Final diagnoses:  Symptomatic anemia    New Prescriptions New Prescriptions   No medications on file     Delos Haring, PA-C 01/13/17 1336    Delos Haring, PA-C 01/13/17 1355    Virgel Manifold, MD 01/19/17 1000

## 2017-01-13 NOTE — ED Notes (Signed)
Bre, NT at the bedside attempting to draw labs.

## 2017-01-13 NOTE — H&P (Addendum)
[facility] Admission History and Physical Service Pager: [phone]  Patient name: [name] Medical record number: [id number] Date of birth: 1986/03/12 Age: 31 y.o. Gender: female  Primary Care Provider: No primary care provider on file. Consultants: None  Code Status: Full  Chief Complaint: Weakness, fatigue, lightheadedness and shortness of breath  Assessment and Plan: [name] is a 31 y.o. female with a past medical history significant for uterine fibroids, CF carrirer, [name] who presented with weakness, fatigue, lightheadedness and mild shortness of breath for the past 3 months and found to be moderately anemic.   Anemia, symptomatic on presentation Patient presented with fatigue, weakness, dizziness and SOB for the past 3 months. Patient was seen at urgent care on 10/10 when CBC was collected. Patient was informed this morning thee Hemoglobin was 6.5 and that she should go to the ED for further evaluation. On presentation to the ED hemoglobin was 6.3 with a MCV of 58.1 and near normal RBC consistent with microcytic anemia of chronic etiology. Patient endorsed pica and even bought "sand" to eat. Heavy menstrual cycle with clots started about three months ago and seem to correlate with beginning of symptoms. 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 Anemia likely gynecologic in etiology based on history of fibroids and complaints of clots during menses.   Less concern about a GI bleed given neg stool occult card and no complaint of  Blood in stool. Young age makes malignancy less likely but still a possibility. Patient's pregnacy test was negative, bleeding is less likely to be from ectopic pregancy and appears to be more of chronic nature. -Admit to FMTS, Admitting physician Dr. Cam Harnden   -Place under observation -Transfuse 2u starting in ED -Follow up on  anemia panel -repeat CBC after transfusion -Follow up on am BMP  -retic count added on to previous draw -ask patient if she has had pelvic MRI in past -Vitals per routine -Acetaminophen 650 mg q6 prn -Will initiate Iron therapy on discharge  Uterine Fibroids, chronic Patient was diagnosed about 7 yrs ago with intermittent abdominal pain and cramping over the years. No recent GYN visit. Last Pap smear on record 2014. Regular menstrual cycle lasting about 4 days. Heavy in the first three days with mild spotting on her last day. -Arrange for GYN followup on discharge  FEN/GI: regular diet Prophylaxis: scds out of concern for bleeding  Disposition: admit for observation  History of Present Illness:  [name] is a 30 y.o. female with a past medical history significant for uterine fibroids who presents today complaining with fatigue, dizziness, weakness and shortness of breath. Patient says she is coming in out of concern for 2-3 months or worsening fatigue and lightheadedness. Patient was seen yesterday at urgent care and was told this morning that her hemoglobin was 6.5   They told her to come to the ED for transfusion evaluation and Hgb was 6.3 in ED.  She describes a feeling of "wanting to eat sand" and even ordered some sand/rock and has eaten it. Patient endorses some shortness of breath but denies other chest pain, abdominal pain, nausea, vomiting.   She states she has regular menses that last about 4 days and that she passes "chunky clots" but that she doesn't have bleeding otherwise.   Patient denies any trauma   Review Of Systems: Per HPI with the following additions:   Review of Systems  Musculoskeletal: Positive for falls.    Patient Active Problem List   Diagnosis Date Noted  . Anemia 05/03/2013  . Yeast infection 05/03/2013  . 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 Elevated AFP 05/03/2013    Past Medical History: Past Medical  History:  Diagnosis Date  . Fibroid   . Fibroids     Past Surgical History: Past Surgical History:  Procedure Laterality Date  . CESAREAN SECTION      Social History: Social History  Substance Use Topics  . Smoking status: Never Smoker  . Smokeless tobacco: Never Used  . Alcohol use No     Comment: social   Additional social history: from [place]  Please also refer to relevant sections of EMR.  Family History: No family history on file. (If not completed, MUST add something in)  Allergies and Medications: No Known Allergies No current facility-administered medications on file prior to encounter.    Current Outpatient Prescriptions on File Prior to Encounter  Medication Sig Dispense Refill  . predniSONE (DELTASONE) 20 MG tablet Two daily with food (Patient not taking: Reported on 07/12/2015) 10 tablet 0    Objective: BP 115/77   Pulse 63   Temp 99.3 F (37.4 C) (Oral)   Resp 18   LMP 12/23/2016   SpO2 100%  Exam: General: pleasant and well kempt, non toxic appearing Eyes: sclera clear , EOMI ENTM: oropharanyx without pallor or noted lesions Cardiovascular: RRR, 2/6 systolic murmur on L sternal border, no edema in LE bilaterally Respiratory: CTA bilaterally, no visible IWB, no wheezing Gastrointestinal: no belly pain on deep palpation, bowel sounds present in 4 quadrants MSK: grossly normal Derm: no lesions noted Neuro: grossly normal without noticeable deficit Psych: appropriate  Labs and Imaging: CBC BMET   Recent Labs Lab 01/13/17 1030  WBC 4.4  HGB 6.3*  HCT 21.2*  PLT 230    Recent Labs Lab 01/13/17 1030  NA 139  K 4.3  CL 111  CO2 21*  BUN 9  CREATININE 0.66  GLUCOSE 92  CALCIUM 9.0     FOBT-negative  I have seen and evaluated the patient with Dr. Criss Rosales. I am in agreement with the note above in its revised form. My additions are in blue.  Marjie Skiff, MD Family Medicine, PGY-2   Sherene Sires, DO 01/13/2017, 2:04  PM PGY-1, [place] Intern pager: [phone], text pages welcome   I have interviewed and examined the patient.  I have discussed the case and verified the key findings with Dr. Criss Rosales and Dr Andy Gauss.   I agree with their assessments and plans as documented in their visit note.

## 2017-01-13 NOTE — ED Triage Notes (Signed)
Went to urgent care yesterday for dizziness and not feeling good.  They called her and told her to come her ED for hgb 6.5.

## 2017-01-13 NOTE — ED Notes (Signed)
Pt ambulatory to restroom with steady gait.

## 2017-01-13 NOTE — ED Notes (Signed)
Blood transfusion consent obtained

## 2017-01-13 NOTE — ED Notes (Signed)
Family medicine rounding at bedside

## 2017-01-13 NOTE — ED Notes (Signed)
Pt made aware of need for urine sample. Stated she could not go at this time.

## 2017-01-13 NOTE — ED Notes (Addendum)
Visible clotting noted in syringe after drawing blood due to slow blood flow. Will have someone attempt to stick patient to obtain anemia panel. Will hold second unit until blood specimen obtained

## 2017-01-14 ENCOUNTER — Observation Stay (HOSPITAL_COMMUNITY): Payer: BLUE CROSS/BLUE SHIELD

## 2017-01-14 DIAGNOSIS — D250 Submucous leiomyoma of uterus: Secondary | ICD-10-CM | POA: Diagnosis not present

## 2017-01-14 DIAGNOSIS — D219 Benign neoplasm of connective and other soft tissue, unspecified: Secondary | ICD-10-CM

## 2017-01-14 DIAGNOSIS — D649 Anemia, unspecified: Secondary | ICD-10-CM | POA: Diagnosis not present

## 2017-01-14 LAB — TYPE AND SCREEN
ABO/RH(D): O POS
ANTIBODY SCREEN: NEGATIVE
Unit division: 0
Unit division: 0

## 2017-01-14 LAB — BASIC METABOLIC PANEL
Anion gap: 9 (ref 5–15)
BUN: 11 mg/dL (ref 6–20)
CO2: 19 mmol/L — ABNORMAL LOW (ref 22–32)
Calcium: 8.8 mg/dL — ABNORMAL LOW (ref 8.9–10.3)
Chloride: 110 mmol/L (ref 101–111)
Creatinine, Ser: 0.77 mg/dL (ref 0.44–1.00)
GFR calc Af Amer: >60 mL/min (ref >60)
GFR calc non Af Amer: >60 mL/min (ref >60)
Glucose, Bld: 103 mg/dL — ABNORMAL HIGH (ref 65–99)
Potassium: 3.9 mmol/L (ref 3.5–5.1)
Sodium: 138 mmol/L (ref 135–145)

## 2017-01-14 LAB — BPAM RBC
BLOOD PRODUCT EXPIRATION DATE: 201811062359
Blood Product Expiration Date: 201811062359
ISSUE DATE / TIME: 201810111356
ISSUE DATE / TIME: 201810111759
UNIT TYPE AND RH: 5100
Unit Type and Rh: 5100

## 2017-01-14 LAB — CBC
HCT: 29.7 % — ABNORMAL LOW (ref 36.0–46.0)
Hemoglobin: 9.1 g/dL — ABNORMAL LOW (ref 12.0–15.0)
MCH: 20 pg — ABNORMAL LOW (ref 26.0–34.0)
MCHC: 30.6 g/dL (ref 30.0–36.0)
MCV: 65.3 fL — ABNORMAL LOW (ref 78.0–100.0)
Platelets: 201 10*3/uL (ref 150–400)
RBC: 4.55 MIL/uL (ref 3.87–5.11)
RDW: 26.7 % — ABNORMAL HIGH (ref 11.5–15.5)
WBC: 5.9 10*3/uL (ref 4.0–10.5)

## 2017-01-14 LAB — HEMOGLOBIN AND HEMATOCRIT, BLOOD
HEMATOCRIT: 29.4 % — AB (ref 36.0–46.0)
HEMOGLOBIN: 9 g/dL — AB (ref 12.0–15.0)

## 2017-01-14 LAB — HIV ANTIBODY (ROUTINE TESTING W REFLEX): HIV Screen 4th Generation wRfx: NONREACTIVE

## 2017-01-14 MED ORDER — FERROUS SULFATE 325 (65 FE) MG PO TABS: 325.0000 mg | ORAL_TABLET | Freq: Two times a day (BID) | ORAL | 3 refills | Status: DC

## 2017-01-14 NOTE — Discharge Instructions (Signed)
It has been a pleasure taking care of you! You were admitted due to anemia, which was likely due to heavy bleeding from fibroids. We have treated you with blood transfusion. With that your symptoms improved to the point we think it is safe to let you go home and follow up with your primary care doctor. We are discharging you on iron pills  that you need to continue taking after you leave the hospital. Please, make sure to read the directions before you take them. The names and directions on how to take these medications are found on this discharge paper under medication section.  Once you are discharged, your primary care physician will handle any further medical issues. Please note that NO REFILLS for any discharge medications will be authorized once you are discharged, as it is imperative that you return to your primary care physician (or establish a relationship with a primary care physician if you do not have one) for your aftercare needs so that they can reassess your need for medications and monitor your lab values. Take care,   Iron-Rich Diet Iron is a mineral that helps your body to produce hemoglobin. Hemoglobin is a protein in your red blood cells that carries oxygen to your body's tissues. Eating too little iron may cause you to feel weak and tired, and it can increase your risk for infection. Eating enough iron is necessary for your body's metabolism, muscle function, and nervous system. Iron is naturally found in many foods. It can also be added to foods or fortified in foods. There are two types of dietary iron:  Heme iron. Heme iron is absorbed by the body more easily than nonheme iron. Heme iron is found in meat, poultry, and fish.  Nonheme iron. Nonheme iron is found in dietary supplements, iron-fortified grains, beans, and vegetables.  You may need to follow an iron-rich diet if:  You have been diagnosed with iron deficiency or iron-deficiency anemia.  You have a condition that  prevents you from absorbing dietary iron, such as: ? Infection in your intestines. ? Celiac disease. This involves long-lasting (chronic) inflammation of your intestines.  You do not eat enough iron.  You eat a diet that is high in foods that impair iron absorption.  You have lost a lot of blood.  You have heavy bleeding during your menstrual cycle.  You are pregnant.  What is my plan? Your health care provider may help you to determine how much iron you need per day based on your condition. Generally, when a person consumes sufficient amounts of iron in the diet, the following iron needs are met:  Men. ? 45-71 years old: 11 mg per day. ? 39-36 years old: 8 mg per day.  Women. ? 36-49 years old: 15 mg per day. ? 73-51 years old: 18 mg per day. ? Over 84 years old: 8 mg per day. ? Pregnant women: 27 mg per day. ? Breastfeeding women: 9 mg per day.  What do I need to know about an iron-rich diet?  Eat fresh fruits and vegetables that are high in vitamin C along with foods that are high in iron. This will help increase the amount of iron that your body absorbs from food, especially with foods containing nonheme iron. Foods that are high in vitamin C include oranges, peppers, tomatoes, and mango.  Take iron supplements only as directed by your health care provider. Overdose of iron can be life-threatening. If you were prescribed iron supplements, take them with  orange juice or a vitamin C supplement.  Cook foods in pots and pans that are made from iron.  Eat nonheme iron-containing foods alongside foods that are high in heme iron. This helps to improve your iron absorption.  Certain foods and drinks contain compounds that impair iron absorption. Avoid eating these foods in the same meal as iron-rich foods or with iron supplements. These include: ? Coffee, black tea, and red wine. ? Milk, dairy products, and foods that are high in calcium. ? Beans, soybeans, and peas. ? Whole  grains.  When eating foods that contain both nonheme iron and compounds that impair iron absorption, follow these tips to absorb iron better. ? Soak beans overnight before cooking. ? Soak whole grains overnight and drain them before using. ? Ferment flours before baking, such as using yeast in bread dough. What foods can I eat? Grains Iron-fortified breakfast cereal. Iron-fortified whole-wheat bread. Enriched rice. Sprouted grains. Vegetables Spinach. Potatoes with skin. Green peas. Broccoli. Red and green bell peppers. Fermented vegetables. Fruits Prunes. Raisins. Oranges. Strawberries. Mango. Grapefruit. Meats and Other Protein Sources Beef liver. Oysters. Beef. Shrimp. [place]. Chicken. [place]. Sardines. Chickpeas. Nuts. Tofu. Beverages Tomato juice. Fresh orange juice. Prune juice. Hibiscus tea. Fortified instant breakfast shakes. Condiments Tahini. Fermented soy sauce. Sweets and Desserts Black-strap molasses. Other Wheat germ. The items listed above may not be a complete list of recommended foods or beverages. Contact your dietitian for more options. What foods are not recommended? Grains Whole grains. Bran cereal. Bran flour. Oats. Vegetables Artichokes. Brussels sprouts. Kale. Fruits Blueberries. Raspberries. Strawberries. Figs. Meats and Other Protein Sources Soybeans. Products made from soy protein. Dairy Milk. Cream. Cheese. Yogurt. Cottage cheese. Beverages Coffee. Black tea. Red wine. Sweets and Desserts Cocoa. Chocolate. Ice cream. Other Basil. Oregano. Parsley. The items listed above may not be a complete list of foods and beverages to avoid. Contact your dietitian for more information. This information is not intended to replace advice given to you by your health care provider. Make sure you discuss any questions you have with your health care provider. Document Released: 11/03/2004 Document Revised: 10/10/2015 Document Reviewed: 10/17/2013 Elsevier  Interactive Patient Education  [employer].

## 2017-01-14 NOTE — Progress Notes (Signed)
Patient discharged to home in stable condition. Discharge instruction paper given. Answered all questions and concerns.

## 2017-01-14 NOTE — Progress Notes (Signed)
Family Medicine Teaching Service Daily Progress Note Intern Pager: [phone]  Patient name: [name] Medical record number: [id number] Date of birth: September 12, 1985 Age: 31 y.o. Gender: female  Primary Care Provider: Patient, No Pcp Per Consultants: none Code Status: full  Pt Overview and Major Events to Date:  --Patient admitted 10/11 for weakness, fatigue, lightheadedness and SOB  Assessment and Plan: [name] is a 31 y.o. female with a PMH significant for uterine fibroids, CF carrirer, [name] who presented with weakness, fatigue, lightheadedness and mild shortness of breath for the past 3 months and found to be moderately anemic.   Anemia, symptomatic on presentation Hemoglobin was 6.5 at urgent care 10/10. On presentation to the ED hemoglobin was 6.3 with a MCV of 58.1 and near normal RBC consistent with microcytic anemia of chronic etiology. Patient received 2 units transfused blood 10/11. Today hemoglobin 9.1 and MCV 65.3. Anemia panel with ferritin of 6, elevated TIBC, 458, iron 18 suggesting iron deficiency. Patient hx of PICA and heavy menstrual cycle with clots x 3 months.  Anemia likely gynecologic in etiology based on history of fibroids and complaints of clots during menses. Neg stool occult card and no complaint of blood in stool. Young age makes malignancy less likely but still a possibility. Patient's pregnacy test was negative, appears to be more of chronic nature. -CBC improved as noted above -Repeat H&H upon discharge  -am BMP with slightly decreased CO2, 19.  -retic count WNL -Continue to monitor vitals;stable today 10/12 -Continue Acetaminophen 650 mg q6 prn -Pelvic U/S ordered today -Will initiate oral Iron therapy on discharge, plan to D/C today   Uterine Fibroids, chronic Patient was diagnosed about 7 yrs ago with intermittent abdominal pain and cramping over the years. No recent GYN visit. Last Pap smear on record 2014. Regular  menstrual cycle lasting about 4 days. Heavy in the first three days with mild spotting on her last day. -Arrange for GYN followup on discharge  FEN/GI: regular diet Prophylaxis: scds out of concern for bleeding  Disposition: Discharge home today   Subjective:  31 y.o. Female who presented yesterday for evaluation of weakness, fatigue, lightheadedness and SOB. Today the patient denies these symptoms and states she feels greatly improved. She denies any abdominal pain or bleeding. She denies any pain. Patient denies past pelvic MRI. Of note, she was able to eat and void last night without issue.   Objective: Temp:  [98.1 F (36.7 C)-99.3 F (37.4 C)] 98.1 F (36.7 C) (10/12 0600) Pulse Rate:  [60-84] 65 (10/12 0600) Resp:  [16-27] 20 (10/12 0600) BP: (93-125)/(62-87) 104/69 (10/12 0600) SpO2:  [95 %-100 %] 100 % (10/12 0600) Weight:  [176 lb 11.2 oz (80.2 kg)] 176 lb 11.2 oz (80.2 kg) (10/11 1951) Physical Exam: General: Alert and oriented x4. No acute distress Cardiovascular: Regular rate and rhythm. Respiratory: CTA bilaterally, no wheezing or crackles. Good respiratory effort. Abdomen: Soft, non-tender. BS are present.   Extremities: No LE edema or tenderness to palpation    Laboratory:  Recent Labs Lab 01/13/17 1030 01/14/17 0442  WBC 4.4 5.9  HGB 6.3* 9.1*  HCT 21.2* 29.7*  PLT 230 201    Recent Labs Lab 01/13/17 1030 01/14/17 0442  NA 139 138  K 4.3 3.9  CL 111 110  CO2 21* 19*  BUN 9 11  CREATININE 0.66 0.77  CALCIUM 9.0 8.8*  PROT 6.9  --   BILITOT 0.6  --   ALKPHOS 56  --  ALT 14  --   AST 20  --   GLUCOSE 92 103*    Imaging/Diagnostic Tests: No results found.   [name], Medical Student 01/14/2017, [place] Intern pager: [phone], text pages welcome

## 2017-01-14 NOTE — Progress Notes (Signed)
Tech offered Pt assistance with bath. Pt stated she is waiting for her husband to bring her some things from home before taking a bath.

## 2017-01-14 NOTE — Discharge Summary (Signed)
[facility] Discharge Summary  Patient name: [name] Medical record number: [id number] Date of birth: 10-23-1985 Age: 31 y.o. Gender: female Date of Admission: 01/13/2017  Date of Discharge: 01/14/17 Admitting Physician: Blane Ohara McDiarmid, MD  Primary Care Provider: Patient, No Pcp Per Consultants: none  Indication for Hospitalization: Symptomatic anemia  Discharge Diagnoses/Problem List:  Patient Active Problem List   Diagnosis Date Noted  . Symptomatic anemia 05/03/2013  . Yeast infection 05/03/2013  . 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 Elevated AFP 05/03/2013    Disposition: home  Discharge Condition: stable  Discharge Exam:  GEN: appears well, no apparent distress. Head: normocephalic and atraumatic  Eyes: conjunctiva pale, sclera anicteric Oropharynx: mmm without erythema or exudation. No glossitis HEM: negative for cervical or periauricular lymphadenopathies CVS: RRR, nl s1 & s2, no murmurs, no edema RESP: no IWOB, good air movement bilaterally, CTAB GI: BS present & normal, soft, NTND GU: no suprapubic or CVA tenderness MSK: no focal tenderness or notable swelling SKIN: Palms and fingernails look pale NEURO: alert and oiented appropriately, no gross deficits  PSYCH: euthymic mood with congruent affect  Brief Hospital Course:  [name] is at 30 year old female with history of uterine myoma and cystic fibrosis carrier who was admitted for symptomatic anemia likely due to heavy period.  Iron deficiency anemia likely due to chronic blood loss: Patient presented with fatigue, weakness, dizziness and dyspnea for the past 3 months. She has history of heavy for the last 4 months likely due to fibroid.  Patient had a transvaginal pelvic ultrasound that showed 3.3 x 2.2 x 3.2 cm fibroid. Hemoglobin is down to 6.3 on admission. Anemia panel classic for iron deficiency anemia. She was  transfused 2 units of packed red blood cells. Hemoglobin trended up to 9.1, which is slightly more than expected with 2 units of blood. Repeat hemoglobin remained stable at 9.0. She was discharged on ferrous sulfate 325 mg twice a day.  Issues for Follow Up:  1. Iron deficiency anemia: discharge on ferrous sulfate 325 mg twice a day. Recommend repeat CBC 2. Fibroid: recommend referral to GYN  Significant Procedures: none  Significant Labs and Imaging:   Recent Labs Lab 01/13/17 1030 01/14/17 0442 01/14/17 1759  WBC 4.4 5.9  --   HGB 6.3* 9.1* 9.0*  HCT 21.2* 29.7* 29.4*  PLT 230 201  --     Recent Labs Lab 01/13/17 1030 01/14/17 0442  NA 139 138  K 4.3 3.9  CL 111 110  CO2 21* 19*  GLUCOSE 92 103*  BUN 9 11  CREATININE 0.66 0.77  CALCIUM 9.0 8.8*  ALKPHOS 56  --   AST 20  --   ALT 14  --   ALBUMIN 3.7  --     Results/Tests Pending at Time of Discharge: none  Discharge Medications:  Allergies as of 01/14/2017   No Known Allergies     Medication List    STOP taking these medications   predniSONE 20 MG tablet Commonly known as:  DELTASONE     TAKE these medications   acetaminophen 500 MG tablet Commonly known as:  TYLENOL Take 500 mg by mouth every 6 (six) hours as needed.   ferrous sulfate 325 (65 FE) MG tablet Take 1 tablet (325 mg total) by mouth 2 (two) times daily with a meal.       Discharge Instructions: Please refer to Patient Instructions section of EMR for  full details.  Patient was counseled important signs and symptoms that should prompt return to medical care, changes in medications, dietary instructions, activity restrictions, and follow up appointments.   Follow-Up Appointments: Follow-up Information    Nicolette Bang, DO. Go on 01/20/2017.   Specialty:  Family Medicine Why:  at 10:50 am Contact information: [place] [address] [phone]           Mercy Riding, MD 01/14/2017, 7:13 PM PGY-3,  [place]

## 2017-01-20 ENCOUNTER — Ambulatory Visit (INDEPENDENT_AMBULATORY_CARE_PROVIDER_SITE_OTHER): Payer: BLUE CROSS/BLUE SHIELD | Admitting: Internal Medicine

## 2017-01-20 ENCOUNTER — Encounter: Payer: Self-pay | Admitting: Internal Medicine

## 2017-01-20 VITALS — BP 112/68 | HR 69 | Temp 98.3°F | Ht 66.0 in | Wt 173.2 lb

## 2017-01-20 DIAGNOSIS — D509 Iron deficiency anemia, unspecified: Secondary | ICD-10-CM | POA: Diagnosis not present

## 2017-01-20 NOTE — Patient Instructions (Signed)
I would recommend following up with your gynecologist.   We will get your blood counts today and call you with those results.   Nice to meet you!   Dr. Juleen China

## 2017-01-21 LAB — CBC
Hematocrit: 33.7 % — ABNORMAL LOW (ref 34.0–46.6)
Hemoglobin: 9.9 g/dL — ABNORMAL LOW (ref 11.1–15.9)
MCH: 19.9 pg — AB (ref 26.6–33.0)
MCHC: 29.4 g/dL — AB (ref 31.5–35.7)
MCV: 68 fL — AB (ref 79–97)
PLATELETS: 286 10*3/uL (ref 150–379)
RBC: 4.98 x10E6/uL (ref 3.77–5.28)
RDW: 26.9 % — AB (ref 12.3–15.4)
WBC: 6.8 10*3/uL (ref 3.4–10.8)

## 2017-01-23 NOTE — Progress Notes (Signed)
   Subjective:    [name] - 31 y.o. female MRN [id number]  Date of birth: [birth year]  HPI  [name] is here for hospital follow up. Patient was hospitalized from 10/11 to 10/12 for symptomatic iron deficiency anemia though thought to be due to chronic menstrual blood loss. HgB at presentation was 6.3. She was transfused with 2uPRBC. Discharge hemoglobin remained stable at 9.0. She was discharged with Ferrous Sulfate 325 mg BID. Patient reports compliance with Fe supplements and that she is tolerating it well without significant constipation or GI upset. She reports the fatigue and dizziness that she presented to hospital with has since resolved. She denies chest pain or SOB.   -  reports that she has never smoked. She has never used smokeless tobacco. - Review of Systems: Per HPI. - Past Medical History: Patient Active Problem List   Diagnosis Date Noted  . Symptomatic anemia 05/03/2013  . Yeast infection 05/03/2013  . 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 Fibroids 05/03/2013  . Elevated AFP 05/03/2013   - Medications: reviewed and updated   Objective:   Physical Exam BP 112/68   Pulse 69   Temp 98.3 F (36.8 C) (Oral)   Ht 5\' 6"  (1.676 m)   Wt 173 lb 3.2 oz (78.6 kg)   LMP 01/14/2017 (Exact Date)   SpO2 99%   BMI 27.96 kg/m  Gen: NAD, alert, cooperative with exam, well-appearing HEENT: NCAT, PERRL, mild conjunctival pallor  CV: RRR, good S1/S2, no murmur, no edema, capillary refill brisk  Resp: CTABL, no wheezes, non-labored      Assessment & Plan:   1. Iron deficiency anemia, unspecified iron deficiency anemia type Patient asymptomatic and compliant with Fe supplements. Will obtain CBC today to ensure HgB is up-trending. Continue Fe supplements and plan to repeat labs in about 3 months. Return precautions discussed. Fibroids noted on transvaginal pelvic US. Have discussed  following up with GYN (patient is already established with a doctor so  referral not needed).   Phill Myron, D.O. 01/23/2017, 7:42 AM PGY-3, [place]

## 2017-01-24 ENCOUNTER — Telehealth: Payer: Self-pay

## 2017-01-24 NOTE — Telephone Encounter (Signed)
Pt informed. [name], CMA  

## 2017-01-24 NOTE — Telephone Encounter (Signed)
-----   Message from Nicolette Bang, DO sent at 01/23/2017  9:10 AM EDT ----- Please call patient to let her know HgB is trending up to 9.9 (from 9.0 on hospital discharge). She should continue the Fe supplements and follow up in a couple months for repeat blood work.   Phill Myron, D.O. 01/23/2017, 9:10 AM PGY-3, [place]

## 2017-03-17 ENCOUNTER — Encounter: Payer: Self-pay | Admitting: Internal Medicine

## 2017-03-17 ENCOUNTER — Ambulatory Visit (INDEPENDENT_AMBULATORY_CARE_PROVIDER_SITE_OTHER): Payer: BLUE CROSS/BLUE SHIELD | Admitting: Internal Medicine

## 2017-03-17 ENCOUNTER — Other Ambulatory Visit (HOSPITAL_COMMUNITY)
Admission: RE | Admit: 2017-03-17 | Discharge: 2017-03-17 | Disposition: A | Discharge status: Home / Self Care | Payer: BLUE CROSS/BLUE SHIELD | Source: Ambulatory Visit | Attending: Family Medicine | Admitting: Family Medicine

## 2017-03-17 ENCOUNTER — Other Ambulatory Visit: Payer: Self-pay

## 2017-03-17 VITALS — BP 102/60 | HR 88 | Temp 98.7°F | Ht 66.0 in | Wt 176.0 lb

## 2017-03-17 DIAGNOSIS — Z113 Encounter for screening for infections with a predominantly sexual mode of transmission: Secondary | ICD-10-CM | POA: Diagnosis not present

## 2017-03-17 DIAGNOSIS — D509 Iron deficiency anemia, unspecified: Secondary | ICD-10-CM

## 2017-03-17 DIAGNOSIS — Z124 Encounter for screening for malignant neoplasm of cervix: Secondary | ICD-10-CM

## 2017-03-17 NOTE — Patient Instructions (Addendum)
We will call you with your results early next week. Good to see you today!

## 2017-03-17 NOTE — Progress Notes (Signed)
   Subjective:    [name] - 31 y.o. female MRN [id number]  Date of birth: Jul 07, 1985  HPI  [name] is here for physical exam.   Iron Deficiency Anemia: Hospitalized in October 2018 for significant and symptomatic iron deficiency anemia requiring blood transfusion. Since discharge she has been taking Ferrous Sulfate 325 mg BID. She reports compliance with medication that she is tolerating it well without significant constipation or GI upset. She denies fatigue, lightheadedness, SOB, and chest pain. Reports that she had a fingerstick HgB done at work recently that was 13.4.   Screening for STDs: Patient requests screening for STDs. She is sexually active with one female partner. Denies vaginal discharge, dysuria, vaginal itching, or pelvic pain.    ROS:  Patient reports no  vision/ hearing changes,anorexia, weight change, fever ,adenopathy, persistant / recurrent hoarseness, swallowing issues, chest pain, edema,persistant / recurrent cough, hemoptysis, dyspnea(rest, exertional, paroxysmal nocturnal), gastrointestinal  bleeding (melena, rectal bleeding), abdominal pain, excessive heart burn, GU symptoms(dysuria, hematuria, pyuria, voiding/incontinence  Issues) syncope, focal weakness, severe memory loss, concerning skin lesions, depression, anxiety, abnormal bruising/bleeding, major joint swelling, breast masses or abnormal vaginal bleeding.    Health Maintenance Due  Topic Date Due  . PAP SMEAR  02/09/2016    -  reports that  has never smoked. she has never used smokeless tobacco. - Review of Systems: Per HPI. - Past Medical History: Patient Active Problem List   Diagnosis Date Noted  . Symptomatic anemia 05/03/2013  . Yeast infection 05/03/2013  . 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 Fibroids 05/03/2013  . Elevated AFP 05/03/2013   - Medications: reviewed and updated   Objective:   Physical Exam BP 102/60   Pulse 88   Temp 98.7 F (37.1 C) (Oral)   Ht  5\' 6"  (1.676 m)   Wt 176 lb (79.8 kg)   LMP 03/04/2017   SpO2 98%   BMI 28.41 kg/m  Gen: NAD, alert, cooperative with exam, well-appearing HEENT: NCAT, PERRL, clear conjunctiva, oropharynx clear, supple neck CV: RRR, good S1/S2, no murmur, no edema, capillary refill brisk  Resp: CTABL, no wheezes, non-labored Abd: SNTND, BS present, no guarding or organomegaly Skin: no rashes, normal turgor  Neuro: no gross deficits.  Psych: good insight, alert and oriented GU/GYN: Exam performed in the presence of a chaperone. External genitalia within normal limits.  Vaginal mucosa pink, moist, normal rugae.  Nonfriable cervix without lesions, no discharge or bleeding noted on speculum exam.  Bimanual exam revealed enlarged uterus consistent with history of fibroids.  No cervical motion tenderness. No adnexal masses bilaterally.       Assessment & Plan:   1. Iron deficiency anemia, unspecified iron deficiency anemia type Last HgB from 10/18 was 9.9. Will repeat CBC today. Reassuring that hemoglobin reportedly well within normal range on POCT at work. Patient is asymptomatic today.   2. Screening examination for sexually transmitted disease - HIV antibody (with reflex) - RPR - Cervicovaginal ancillary only  3. Encounter for screening for malignant neoplasm of cervix No history of abnormal PAPs within the past. Last PAP in 2014 was negative.  - Cytology - PAP [place], D.O. 03/17/2017, 11:02 AM PGY-3, [place]

## 2017-03-18 ENCOUNTER — Telehealth: Payer: Self-pay | Admitting: *Deleted

## 2017-03-18 LAB — CBC
Hematocrit: 36.8 % (ref 34.0–46.6)
Hemoglobin: 11.8 g/dL (ref 11.1–15.9)
MCH: 25.6 pg — AB (ref 26.6–33.0)
MCHC: 32.1 g/dL (ref 31.5–35.7)
MCV: 80 fL (ref 79–97)
PLATELETS: 143 10*3/uL — AB (ref 150–379)
RBC: 4.61 x10E6/uL (ref 3.77–5.28)
WBC: 4.9 10*3/uL (ref 3.4–10.8)

## 2017-03-18 LAB — CERVICOVAGINAL ANCILLARY ONLY
CHLAMYDIA, DNA PROBE: NEGATIVE
Neisseria Gonorrhea: NEGATIVE

## 2017-03-18 LAB — HIV ANTIBODY (ROUTINE TESTING W REFLEX): HIV SCREEN 4TH GENERATION: NONREACTIVE

## 2017-03-18 LAB — RPR: RPR: NONREACTIVE

## 2017-03-18 NOTE — Telephone Encounter (Signed)
-----   Message from [facility], MD sent at 03/18/2017 12:31 PM EST ----- Please let patient know her lab results from yesterday were normal.

## 2017-03-18 NOTE — Telephone Encounter (Signed)
Tried to contact pt to give her the below information and the person that answered the phone did not speak english and what appeared to be a child then got on the phone and I could not understand them either.  Pt has appointment next week will route to nurse clinic to give results. Katharina Caper, Stephany Poorman D, [place]

## 2017-03-21 ENCOUNTER — Ambulatory Visit (INDEPENDENT_AMBULATORY_CARE_PROVIDER_SITE_OTHER): Payer: BLUE CROSS/BLUE SHIELD | Admitting: *Deleted

## 2017-03-21 DIAGNOSIS — Z111 Encounter for screening for respiratory tuberculosis: Secondary | ICD-10-CM | POA: Diagnosis not present

## 2017-03-21 NOTE — Progress Notes (Signed)
PPD Placement note [name], 31 y.o. female is here today for placement of PPD test Reason for PPD test: work Pt taken PPD test before: yes Pt states that her ppd comes back positive and she usually needs an xray.  But cant tell me if she had the infection or vaccine.   PPD placed on 03/21/2017 @ 10:35 on the left forarm.  Patient advised to return for reading within 48-72 hours.

## 2017-03-21 NOTE — Telephone Encounter (Signed)
Pt informed. Fleeger, Jessica Dawn, CMA  

## 2017-03-22 LAB — CYTOLOGY - PAP
Diagnosis: NEGATIVE
HPV: NOT DETECTED

## 2017-03-23 ENCOUNTER — Ambulatory Visit (INDEPENDENT_AMBULATORY_CARE_PROVIDER_SITE_OTHER): Payer: BLUE CROSS/BLUE SHIELD | Admitting: *Deleted

## 2017-03-23 ENCOUNTER — Ambulatory Visit (HOSPITAL_COMMUNITY)
Admission: RE | Admit: 2017-03-23 | Discharge: 2017-03-23 | Disposition: A | Discharge status: Home / Self Care | Payer: BLUE CROSS/BLUE SHIELD | Source: Ambulatory Visit | Attending: Family Medicine | Admitting: Family Medicine

## 2017-03-23 ENCOUNTER — Telehealth: Payer: Self-pay

## 2017-03-23 DIAGNOSIS — R7611 Nonspecific reaction to tuberculin skin test without active tuberculosis: Secondary | ICD-10-CM | POA: Diagnosis not present

## 2017-03-23 DIAGNOSIS — Z111 Encounter for screening for respiratory tuberculosis: Secondary | ICD-10-CM

## 2017-03-23 LAB — TB SKIN TEST
Induration: 15 mm
TB Skin Test: POSITIVE

## 2017-03-23 NOTE — Progress Notes (Signed)
Discussed and confirmed by exam pos PPD.  Agree with plan.

## 2017-03-23 NOTE — Progress Notes (Signed)
   PPD Reading Note PPD read and results entered in Epic. Result: 15 mm induration. Interpretation: Positive Verified by Preceptor, Dr. Andria Frames CXR ordered Notified Maylon Peppers at [facility] Dept. Will need to fax copy of CXR report along with PPD reading documentation to Tammy at [phone] She will then contact patient to schedule appt at Health Dept.Hubbard Hartshorn, RN, BSN

## 2017-03-23 NOTE — Telephone Encounter (Signed)
-----   Message from Nicolette Bang, DO sent at 03/23/2017  1:07 PM EST ----- Please call patient to let her know PAP was normal.   Phill Myron, D.O. 03/23/2017, 1:07 PM PGY-3, [place]

## 2017-03-23 NOTE — Telephone Encounter (Signed)
Informed pt about pap results. Pt asked for [place] to call her when we get the results from the xray that was done today. Ottis Stain, CMA

## 2017-03-24 NOTE — Telephone Encounter (Signed)
Patient's chest x-ray was normal and there was no evidence of active TB.   Phill Myron, D.O. 03/24/2017, 9:49 AM PGY-3, [place]

## 2017-03-24 NOTE — Telephone Encounter (Signed)
Spoke to pt. She needs a copy of the results and a letter saying that the xray was normal and she doesn't have TB. Would like to pick this up tomorrow. Please advise. Ottis Stain, CMA

## 2017-03-24 NOTE — Telephone Encounter (Signed)
Pt informed. [name], CMA  

## 2017-03-24 NOTE — Progress Notes (Signed)
Copy of positive PPD, normal CXR and patient contact info faxed to Maylon Peppers at [facility] Dept. [phone]. Hubbard Hartshorn, RN, BSN

## 2017-03-24 NOTE — Telephone Encounter (Signed)
Letter and copy of CXR placed up front for patient to pick up. Dr. Juleen China aware. Hubbard Hartshorn, RN, BSN

## 2017-07-29 ENCOUNTER — Telehealth: Payer: Self-pay | Admitting: Internal Medicine

## 2017-07-29 NOTE — Telephone Encounter (Signed)
Pt now says that she has a physical form from her school that needs filled out. I told her she needs to drop it off to be filled out.

## 2017-07-29 NOTE — Telephone Encounter (Signed)
Pt would like a copy of her most recent physical.

## 2017-08-08 ENCOUNTER — Telehealth: Payer: Self-pay | Admitting: Internal Medicine

## 2017-08-08 NOTE — Telephone Encounter (Signed)
School health form dropped off for at front desk for completion.  Verified that patient section of form has been completed.  Last DOS/WCC with PCP was 03/17/17.  Placed form in  [facility] team folder to be completed by clinical staff.  Carmina Miller

## 2017-08-09 NOTE — Telephone Encounter (Signed)
Clinical info completed on School form.  Place form in Dr. Alcario Drought box for completion.  Katharina Caper, Atziri Zubiate D, [place]

## 2017-08-10 NOTE — Telephone Encounter (Signed)
Reviewed, completed, and signed form.  Note routed to RN team inbasket and placed completed form in Clinic RN's office (wall pocket above desk).  Catherine L Wallace, DO  

## 2017-08-11 NOTE — Telephone Encounter (Signed)
Patient made aware that forms are available for pick up. Danley Danker, RN [facility] RN)

## 2017-08-18 ENCOUNTER — Telehealth: Payer: Self-pay | Admitting: Internal Medicine

## 2017-08-18 ENCOUNTER — Encounter: Payer: Self-pay | Admitting: Internal Medicine

## 2017-08-18 ENCOUNTER — Other Ambulatory Visit: Payer: Self-pay

## 2017-08-18 ENCOUNTER — Ambulatory Visit (INDEPENDENT_AMBULATORY_CARE_PROVIDER_SITE_OTHER): Payer: BLUE CROSS/BLUE SHIELD | Admitting: Internal Medicine

## 2017-08-18 VITALS — BP 102/62 | HR 68 | Temp 98.6°F | Ht 66.0 in | Wt 181.0 lb

## 2017-08-18 DIAGNOSIS — Z0184 Encounter for antibody response examination: Secondary | ICD-10-CM

## 2017-08-18 NOTE — Patient Instructions (Addendum)
We will check your blood work to see if you are immune to these diseases or if you require re-vaccination for anything. We will call you with the results in the next few days and let you know if you need to come in for any shots. Good luck with school!

## 2017-08-18 NOTE — Progress Notes (Signed)
ROI completed to allow [place] to send records to [facility].  Her husband is being seen there and they are requesting notes about patient. I have given the ROI to our referral coordinator Leory Plowman) so that she may call and see what notes they need and fax accordingly . Meriem Lemieux, Salome Spotted, CMA

## 2017-08-18 NOTE — Progress Notes (Signed)
   Subjective:    [name] - 32 y.o. 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 She has record of having MMR, Hep B, and Varicella vaccinations from her high school records. But she is not sure of immunity status.   -  reports that she has never smoked. She has never used smokeless tobacco. - Review of Systems: Per HPI. - Past Medical History: Patient Active Problem List   Diagnosis Date Noted  . Symptomatic anemia 05/03/2013  . Yeast infection 05/03/2013  . 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 Fibroids 05/03/2013  . Elevated AFP 05/03/2013   - Medications: reviewed and updated   Objective:   Physical Exam BP 102/62   Pulse 68   Temp 98.6 F (37 C) (Oral)   Ht _0  (1.676 m)   Wt 181 lb (82.1 kg)   SpO2 98%   BMI 29.21 kg/m  Gen: NAD, alert, cooperative with exam, well-appearing Psych: good insight, alert and oriented    Assessment & Plan:   1. Immunity status testing Will check titers for labs and see if patient requires re-vaccination for any of the below vaccines.  - Measles/Mumps/Rubella Immunity - Varicella zoster antibody, IgG - Hepatitis B surface antibody  Phill Myron, D.O. 08/18/2017, 9:33 AM PGY-3, [place]

## 2017-08-19 LAB — MEASLES/MUMPS/RUBELLA IMMUNITY
MUMPS ABS, IGG: 102 AU/mL (ref >10.9)
RUBEOLA AB, IGG: 63.5 [AU]/ml (ref >29.9)
Rubella Antibodies, IGG: 12.8 index (ref >0.99)

## 2017-08-19 LAB — VARICELLA ZOSTER ANTIBODY, IGG: VARICELLA: 387 {index} (ref >165)

## 2017-08-19 LAB — HEPATITIS B SURFACE ANTIBODY,QUALITATIVE: HEP B SURFACE AB, QUAL: REACTIVE

## 2017-08-22 ENCOUNTER — Encounter: Payer: Self-pay | Admitting: Internal Medicine

## 2017-08-22 NOTE — Progress Notes (Signed)
Letter with lab results routed to admin pool.   Phill Myron, D.O. 08/22/2017, 1:41 PM PGY-3, [place]

## 2017-09-22 ENCOUNTER — Other Ambulatory Visit: Payer: Self-pay | Admitting: Internal Medicine

## 2017-09-22 ENCOUNTER — Telehealth: Payer: Self-pay | Admitting: Internal Medicine

## 2017-09-22 DIAGNOSIS — Z319 Encounter for procreative management, unspecified: Secondary | ICD-10-CM

## 2017-09-22 NOTE — Progress Notes (Signed)
I have put in the referral for Ob-GYN for infertility. Please let me know if anything else is needed. Thanks!   Phill Myron, D.O. 09/22/2017, 3:23 PM PGY-3, [place]

## 2018-04-07 ENCOUNTER — Ambulatory Visit (INDEPENDENT_AMBULATORY_CARE_PROVIDER_SITE_OTHER): Payer: BLUE CROSS/BLUE SHIELD | Admitting: Family Medicine

## 2018-04-07 ENCOUNTER — Other Ambulatory Visit: Payer: Self-pay

## 2018-04-07 ENCOUNTER — Encounter: Payer: Self-pay | Admitting: Family Medicine

## 2018-04-07 VITALS — BP 114/64 | HR 84 | Temp 98.2°F | Ht 66.0 in | Wt 171.6 lb

## 2018-04-07 DIAGNOSIS — R5383 Other fatigue: Secondary | ICD-10-CM | POA: Diagnosis not present

## 2018-04-07 DIAGNOSIS — D649 Anemia, unspecified: Secondary | ICD-10-CM | POA: Diagnosis not present

## 2018-04-07 NOTE — Assessment & Plan Note (Signed)
Patient complaining of vague fatigue symptoms over the last few months without any weakness, vertigo, bleeding, neurological symptoms, chest pain, shortness of breath.  Well anemia given her history appears to be the most likely culprit though she has not had a fatigue work-up in the past.  Will also order TSH and BMP to go with a CBC

## 2018-04-07 NOTE — Assessment & Plan Note (Signed)
Patient with history of symptomatic anemia which seems to be resolved with twice daily dosing of iron.  Says that she has had slightly increasing fatigue with no specific symptoms no shortness of breath, loss of consciousness, bleeding, vertigo symptoms, respiratory symptoms, chest pain.  She says she is only been taking once daily iron for almost a year.  Wants to know if this is the problem  We will recheck CBC, in the meantime patient is instructed to continue once daily iron it may be that that is appropriate for her if she needs to return to twice daily we will call her.

## 2018-04-07 NOTE — Progress Notes (Signed)
    Subjective:  [name] is a 33 y.o. female who presents to the [facility] today with a chief complaint of fatigue.   HPI: Symptomatic anemia Patient with history of symptomatic anemia which seems to be resolved with twice daily dosing of iron.  Says that she has had slightly increasing fatigue with no specific symptoms no shortness of breath, loss of consciousness, bleeding, vertigo symptoms, respiratory symptoms, chest pain.  She says she is only been taking once daily iron for almost a year.  Wants to know if this is the problem  Fatigue Patient complaining of vague fatigue symptoms over the last few months without any weakness, vertigo, bleeding, neurological symptoms, chest pain, shortness of breath.  Well anemia given her history appears to be the most likely culprit though she has not had a fatigue work-up in the past.  She does wonder if her schedule raising her child during the day and going to school at night is just becoming too much for her.     Objective:  Physical Exam: BP 114/64   Pulse 84   Temp 98.2 F (36.8 C) (Oral)   Ht 5\' 6"  (1.676 m)   Wt 171 lb 9.6 oz (77.8 kg)   LMP 03/18/2018 (Exact Date)   SpO2 96%   BMI 27.70 kg/m   Gen: NAD, resting comfortably CV: RRR with no murmurs appreciated Pulm: NWOB, CTAB with no crackles, wheezes, or rhonchi MSK: no edema, cyanosis, or clubbing noted Skin: warm, dry Neuro: grossly normal, moves all extremities Psych: Normal affect and thought content  No results found for this or any previous visit (from the past 72 hour(s)).   Assessment/Plan:  Symptomatic anemia Patient with history of symptomatic anemia which seems to be resolved with twice daily dosing of iron.  Says that she has had slightly increasing fatigue with no specific symptoms no shortness of breath, loss of consciousness, bleeding, vertigo symptoms, respiratory symptoms, chest pain.  She says she is only been taking once daily iron for almost a year.   Wants to know if this is the problem  We will recheck CBC, in the meantime patient is instructed to continue once daily iron it may be that that is appropriate for her if she needs to return to twice daily we will call her.  Fatigue Patient complaining of vague fatigue symptoms over the last few months without any weakness, vertigo, bleeding, neurological symptoms, chest pain, shortness of breath.  Well anemia given her history appears to be the most likely culprit though she has not had a fatigue work-up in the past.  Will also order TSH and BMP to go with a [place], [place] - PGY2 04/07/2018 2:42 PM

## 2018-04-07 NOTE — Patient Instructions (Signed)
It was a pleasure to see you today! Thank you for choosing [facility] for your primary care. [name] was seen for fatigue. Come back to the clinic if you have any more routine concerns, and go to the emergency room if you have any serious or life-threatening symptoms.   Today we talked about your fatigue and you are concerned that your prior diagnosis of anemia may be contributing.  We ordered some test to try and evaluate this, a CBC-T SH-BMP.  This should result in the next 2 days  If we did any lab work today that did not result today, one of two things will happen.  1. If everything is normal, you will get a letter in mail sent to the address in your chart with the results for your records.  It is important to keep your address up to date as that is where we will send results.  2. If the results require some sort of discussion, my nurses or myself will call you on the phone number listed in your records.  It is important to keep your phone number up to date in our system as this is how we will try to reach you.  If we cannot reach you on the phone, we will try to send you a letter in the mail so please enable to voicemail function of your phone.  If you don't hear from [place] in two weeks, please give [place] a call to verify your results. Otherwise, we look forward to seeing you again at your next visit. If you have any questions or concerns before then, please call the clinic at [phone].   Please bring all your medications to every doctors visit   Sign up for My Chart to have easy access to your labs results, and communication with your Primary care physician.     Please check-out at the front desk before leaving the clinic.     Best,  Dr. Sherene Sires FAMILY MEDICINE RESIDENT - PGY2 04/07/2018 2:27 PM

## 2018-04-08 LAB — BASIC METABOLIC PANEL
BUN/Creatinine Ratio: 18 (ref 9–23)
BUN: 12 mg/dL (ref 6–20)
CO2: 19 mmol/L — ABNORMAL LOW (ref 20–29)
CREATININE: 0.68 mg/dL (ref 0.57–1.00)
Calcium: 9.6 mg/dL (ref 8.7–10.2)
Chloride: 103 mmol/L (ref 96–106)
GFR, EST AFRICAN AMERICAN: 132 mL/min/{1.73_m2} (ref >59)
GFR, EST NON AFRICAN AMERICAN: 114 mL/min/{1.73_m2} (ref >59)
GLUCOSE: 87 mg/dL (ref 65–99)
POTASSIUM: 4.4 mmol/L (ref 3.5–5.2)
SODIUM: 141 mmol/L (ref 134–144)

## 2018-04-08 LAB — CBC
HEMATOCRIT: 34.7 % (ref 34.0–46.6)
Hemoglobin: 11.6 g/dL (ref 11.1–15.9)
MCH: 26.7 pg (ref 26.6–33.0)
MCHC: 33.4 g/dL (ref 31.5–35.7)
MCV: 80 fL (ref 79–97)
Platelets: 233 10*3/uL (ref 150–450)
RBC: 4.34 x10E6/uL (ref 3.77–5.28)
RDW: 13.7 % (ref 12.3–15.4)
WBC: 6 10*3/uL (ref 3.4–10.8)

## 2018-04-08 LAB — TSH: TSH: 0.866 u[IU]/mL (ref 0.450–4.500)

## 2018-04-25 ENCOUNTER — Telehealth: Payer: Self-pay

## 2018-04-25 NOTE — Telephone Encounter (Signed)
Called patient and discussed lab results. All questions were answered.   Dalphine Handing, PGY-2 [facility] 04/25/2018 6:37 PM

## 2018-04-25 NOTE — Telephone Encounter (Signed)
Patient calling for lab results from 04/07/18.  Danley Danker, RN [facility] RN)

## 2018-08-04 ENCOUNTER — Telehealth: Payer: Self-pay | Admitting: Family Medicine

## 2018-08-04 NOTE — Telephone Encounter (Signed)
Clinical info completed on College Form form.  Place form in Dr. Fransico Setters box for completion.  April Zimmerman Rumple, CMA

## 2018-08-04 NOTE — Telephone Encounter (Signed)
Absence of Tb Symptoms  form dropped off at front desk for completion.  Verified that patient section of form has been completed.  Last DOS/WCC with PCP was 04/07/2018.  Placed form in team folder to be completed by clinical staff.  [name]

## 2018-08-08 ENCOUNTER — Other Ambulatory Visit: Payer: Self-pay

## 2018-08-08 ENCOUNTER — Ambulatory Visit (INDEPENDENT_AMBULATORY_CARE_PROVIDER_SITE_OTHER): Payer: BLUE CROSS/BLUE SHIELD | Admitting: Family Medicine

## 2018-08-08 ENCOUNTER — Encounter: Payer: Self-pay | Admitting: Family Medicine

## 2018-08-08 DIAGNOSIS — Z319 Encounter for procreative management, unspecified: Secondary | ICD-10-CM

## 2018-08-08 NOTE — Progress Notes (Signed)
  Subjective:  Patient ID: [name]  DOB: 23-Nov-1985 MRN: [id number]  [name] is a 33 y.o. female with a PMH of fibroids, anemia, here today for form completion.   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  Patient here today for form to be completed.  States that she is asymptomatic when going through the form.  Denies any shortness of breath, fever, chills, night sweats.  Patient also reports that her classes are now online given COVID-19.   Infertility concerns: -She reports that she has been trying to have a baby since her 50-year-old son was born.  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  Has previously been referred for an infertility work-up but has not been able to complete an appointment during COVID-19. -She is interested in seeing female provider here in office to begin work-up, as we could complete TSH, LH, FSH and prolactin as basic labs. -She is also using an app on her phone in order to track her menstrual cycle as well as her likely ovulation.  Patient has not used any ovulation tests at home. -Reports that she has regular periods.  ROS: All other systems otherwise negative, except as mentioned in HPI  Social hx: Denies use of illicit drugs, alcohol use Smoking status reviewed  Patient Active Problem List   Diagnosis Date Noted  . Desire for pregnancy 08/10/2018  . Fatigue 04/07/2018  . Symptomatic anemia 05/03/2013  . Yeast infection 05/03/2013  . 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 Fibroids 05/03/2013  . Elevated AFP 05/03/2013     Objective:  BP 112/64   Pulse 91   SpO2 99%   Vitals and nursing note reviewed  General: NAD, pleasant Pulm: normal efforted Extremities: no edema or cyanosis. WWP. Skin: warm and dry, no rashes  noted Neuro: alert and oriented, no focal deficits Psych: normal affect, normal thought content  Assessment & Plan:   Desire for pregnancy Patient is to schedule follow-up for another appointment regarding starting work-up for infertility given that she is unable to make an appointment during COVID-19.  She was also informed that this will be an out-of-pocket expense however we could test her TSH, LH, prolactin and FSH here in the office in order to start her work up. In the meantime counseled her on obtaining ovulation tests from over-the-counter and continuing to track her menstrual cycle with her apps. To be form completed for school.  [place] Zitlali Primm, DO Family Medicine Resident PGY-2

## 2018-08-08 NOTE — Patient Instructions (Signed)
Thank you for coming to see me today. It was a pleasure! Today we talked about:   Your form has been signed.  I recommend making a follow-up appointment in order to discuss your fertility options.  In the meantime I recommend buying ovulation test and using them based on the calendar that you are using on your phone.   Good luck with school!  Please follow-up or as needed.  If you have any questions or concerns, please do not hesitate to call the office at [phone].  Take Care,   [place] Holdan Stucke, DO

## 2018-08-08 NOTE — Telephone Encounter (Signed)
Received note from Dr. Criss Rosales.  Pt had previous + TB test and - chest xray.  Requested that pt has appt to complete.  Pt agreeable to plan appt made this afternoon with Dr. Enid Derry.  Form in providers box. Christen Bame, CMA

## 2018-08-10 DIAGNOSIS — Z319 Encounter for procreative management, unspecified: Secondary | ICD-10-CM | POA: Insufficient documentation

## 2018-08-10 NOTE — Telephone Encounter (Signed)
Done

## 2018-08-10 NOTE — Assessment & Plan Note (Signed)
Patient is to schedule follow-up for another appointment regarding starting work-up for infertility given that she is unable to make an appointment during COVID-19.  She was also informed that this will be an out-of-pocket expense however we could test her TSH, LH, prolactin and FSH here in the office in order to start her work up. In the meantime counseled her on obtaining ovulation tests from over-the-counter and continuing to track her menstrual cycle with her apps.

## 2018-12-01 ENCOUNTER — Telehealth: Payer: Self-pay | Admitting: Family Medicine

## 2018-12-01 DIAGNOSIS — Z319 Encounter for procreative management, unspecified: Secondary | ICD-10-CM

## 2018-12-01 NOTE — Telephone Encounter (Signed)
Pt called and asked to speak with Dr. Enid Derry. She saw her on 05/05 and said that she needs to f/u with her. She said it is very personal and is only comfortable speaking with her about this. I informed her that Dr. Bonnita Levan next appointment was 09/18. She has scheduled a virtual appointment with her for that date but asks that Dr. Enid Derry call her as soon as possible to ask her one question that she doesn't think can wait until this appointment.   The best call back number is [phone].

## 2018-12-05 NOTE — Telephone Encounter (Signed)
Called patient, she reports that she is not having the ovulation strips show that she is ovulating, so she would like to pursue having some labs done to evaluate for her infertility. She has an appointment on 9/18, but will come in a few days early for the labs, so that we can then discuss the results. Patient to call the front office and schedule a lab visit 3-5 days prior to our appointment on 9/18.   Will also forward to PCP if there are any other questions.

## 2018-12-15 ENCOUNTER — Other Ambulatory Visit: Payer: Self-pay

## 2018-12-15 ENCOUNTER — Other Ambulatory Visit: Payer: BC Managed Care – PPO

## 2018-12-15 DIAGNOSIS — Z319 Encounter for procreative management, unspecified: Secondary | ICD-10-CM

## 2018-12-16 LAB — FOLLICLE STIMULATING HORMONE: FSH: 4.7 m[IU]/mL

## 2018-12-16 LAB — LUTEINIZING HORMONE: LH: 7.4 m[IU]/mL

## 2018-12-16 LAB — PROLACTIN: Prolactin: 18.1 ng/mL (ref 4.8–23.3)

## 2018-12-22 ENCOUNTER — Other Ambulatory Visit: Payer: Self-pay

## 2018-12-22 ENCOUNTER — Ambulatory Visit (INDEPENDENT_AMBULATORY_CARE_PROVIDER_SITE_OTHER): Payer: BC Managed Care – PPO | Admitting: Family Medicine

## 2018-12-22 ENCOUNTER — Encounter: Payer: Self-pay | Admitting: Family Medicine

## 2018-12-22 ENCOUNTER — Telehealth: Payer: BLUE CROSS/BLUE SHIELD | Admitting: Family Medicine

## 2018-12-22 DIAGNOSIS — Z319 Encounter for procreative management, unspecified: Secondary | ICD-10-CM | POA: Diagnosis not present

## 2018-12-22 NOTE — Progress Notes (Signed)
  Subjective:  Patient ID: [name]  DOB: [birth year] MRN: [id number]  [name] is a 33 y.o. female with a PMH of fibroids, anemia,, here today for fertility concerns.   HPI:  Infertility concerns: -She reports that she has been trying to have a baby since her 46-year-old son was born.  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  Has previously been referred for an infertility work-up but has not been able to complete an appointment during COVID-19. -Had normal TSH, LH, FSH and prolactin recently. -She is also using an app on her phone in order to track her menstrual cycle as well as her likely ovulation.   -Patient has used the ovulation tests at home and has not had positive results.  -Reports that she has regular periods. -She denies any other issues or concerns. 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 She would like to discuss with fertility specialist asap  ROS:  as mentioned in HPI  Social hx: Denies use of illicit drugs, alcohol use Smoking status reviewed  Patient Active Problem List   Diagnosis Date Noted  . Desire for pregnancy 08/10/2018  . Fatigue 04/07/2018  . Symptomatic anemia 05/03/2013  . Yeast infection 05/03/2013  . 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 Fibroids 05/03/2013  . Elevated AFP 05/03/2013     Objective:  BP 102/64   Pulse 68   Ht 5\' 6"  (1.676 m)   Wt 180 lb 6 oz (81.8 kg)   LMP 11/26/2018   SpO2 98%   BMI 29.11 kg/m   Vitals and nursing note reviewed  General: NAD, pleasant Pulm: normal effort Extremities: no edema or cyanosis. Gait normal. Skin: warm and dry, no rashes noted Neuro: alert and oriented, no focal deficits Psych: normal affect, normal thought content  Assessment & Plan:   Desire for pregnancy Recent lab work up negative.  Patient previously has had child and reports she believes her husband has had his sperm evaluated and it was normal. She does have hx of fibroids, but would like to further pursue work up and discussion of IVF. Given information to contact Dr. Kerin Perna, [facility] for her to call for further work up. Encouraged her to continue trying with her husband and use the ovulation testing.    [place] Sharvil Hoey, DO Family Medicine Resident PGY-3

## 2018-12-22 NOTE — Patient Instructions (Addendum)
Thank you for coming to see me today. It was a pleasure!   Dr. Kerin Perna [facility]  [address], [place], [place] [address] [phone]  Please follow-up as needed.  If you have any questions or concerns, please do not hesitate to call the office at [phone].  Take Care,   Martinique Fredda Clarida, DO   12/15/2018: LH 7.4 12/15/2018: [place] 4.7 12/15/2018: Prolactin 18.1 04/07/2018: TSH 0.866  BMP Latest Ref Rng & Units 04/07/2018 01/14/2017 01/13/2017  Glucose 65 - 99 mg/dL 87 103(H) 92  BUN 6 - 20 mg/dL 12 11 9   Creatinine 0.57 - 1.00 mg/dL 0.68 0.77 0.66  BUN/Creat Ratio 9 - 23 18 - -  Sodium 134 - 144 mmol/L 141 138 139  Potassium 3.5 - 5.2 mmol/L 4.4 3.9 4.3  Chloride 96 - 106 mmol/L 103 110 111  CO2 20 - 29 mmol/L 19(L) 19(L) 21(L)  Calcium 8.7 - 10.2 mg/dL 9.6 8.8(L) 9.0   CBC    Component Value Date/Time   WBC 6.0 04/07/2018 1433   WBC 5.9 01/14/2017 0442   RBC 4.34 04/07/2018 1433   RBC 4.55 01/14/2017 0442   HGB 11.6 04/07/2018 1433   HGB 11.4 02/08/2013   HCT 34.7 04/07/2018 1433   HCT 33 02/08/2013   PLT 233 04/07/2018 1433   PLT 174 02/08/2013   MCV 80 04/07/2018 1433   MCH 26.7 04/07/2018 1433   MCH 20.0 (L) 01/14/2017 0442   MCHC 33.4 04/07/2018 1433   MCHC 30.6 01/14/2017 0442   RDW 13.7 04/07/2018 1433     Ref. Range 08/18/2017 10:20  MUMPS ABS, IGG Latest Ref Range: Immune >10.9 AU/mL 102.0  Rubella Latest Ref Range: Immune >0.99 index 12.80  RUBEOLA AB, IGG Latest Ref Range: Immune >29.9 AU/mL 63.5  Varicella zoster IgG Latest Ref Range: Immune >165 index 387

## 2018-12-24 NOTE — Assessment & Plan Note (Addendum)
Recent lab work up negative. Patient previously has had child and reports she believes her husband has had his sperm evaluated and it was normal. She does have hx of fibroids, but would like to further pursue work up and discussion of IVF. Given information to contact Dr. Kerin Perna, [facility] for her to call for further work up. Encouraged her to continue trying with her husband and use the ovulation testing.

## 2019-02-01 DIAGNOSIS — D259 Leiomyoma of uterus, unspecified: Secondary | ICD-10-CM | POA: Diagnosis not present

## 2019-02-01 DIAGNOSIS — Z319 Encounter for procreative management, unspecified: Secondary | ICD-10-CM | POA: Diagnosis not present

## 2019-02-21 DIAGNOSIS — D259 Leiomyoma of uterus, unspecified: Secondary | ICD-10-CM | POA: Diagnosis not present

## 2019-02-21 DIAGNOSIS — Z3141 Encounter for fertility testing: Secondary | ICD-10-CM | POA: Diagnosis not present

## 2019-06-28 DIAGNOSIS — D251 Intramural leiomyoma of uterus: Secondary | ICD-10-CM | POA: Diagnosis not present

## 2019-06-28 NOTE — Patient Instructions (Addendum)
DUE TO COVID-19 ONLY ONE VISITOR IS ALLOWED IN WAITING ROOM (VISITOR WILL HAVE A TEMPERATURE CHECK ON ARRIVAL AND MUST WEAR A FACE MASK THE ENTIRE TIME.)  ONCE YOU ARE ADMITTED TO YOUR PRIVATE ROOM, THE SAME ONE VISITOR IS ALLOWED TO VISIT DURING VISITING HOURS ONLY.  Your COVID swab testing is scheduled for: 06/30/19 at 12:30 pm  , You must self quarantine after your testing per handout given to you at the testing site.  ([facility] up testing enter pre-surgical testing [place])     Your procedure is scheduled on:07/04/19  Report to [facility] AT: 9:00  A. M.   Call this number if you have problems the morning of surgery:[phone].   OUR ADDRESS IS [address].  WE ARE LOCATED IN THE [place]                                   [facility].                                     REMEMBER:   DO NOT EAT SOLID FOOD AFTER MIDNIGHT . CLEAR LIQUIDS FROM MIDNIGHT UNTIL: 8:00 AM    CLEAR LIQUID DIET   Foods Allowed                                                                     Foods Excluded  Coffee and tea, regular and decaf                             liquids that you cannot  Plain Jell-O any favor except red or purple                                           see through such as: Fruit ices (not with fruit pulp)                                     milk, soups, orange juice  Iced Popsicles                                    All solid food Carbonated beverages, regular and diet                                    Cranberry, grape and apple juices Sports drinks like Gatorade Lightly seasoned clear broth or consume(fat free) Sugar, honey syrup  Sample Menu Breakfast                                Lunch  Supper Cranberry juice                    Beef broth                            Chicken broth Jell-O                                     Grape juice                            Apple juice Coffee or tea                        Jell-O                                      Popsicle                                                Coffee or tea                        Coffee or tea  _____________________________________________________________________   BRUSH YOUR TEETH THE MORNING OF SURGERY.    DO NOT WEAR JEWERLY, MAKE UP, OR NAIL POLISH.  DO NOT WEAR LOTIONS, POWDERS, PERFUMES/COLOGNE OR DEODORANT.  DO NOT SHAVE FOR 24 HOURS PRIOR TO DAY OF SURGERY.  MEN MAY SHAVE FACE AND NECK.  CONTACTS, GLASSES, OR DENTURES MAY NOT BE WORN TO SURGERY.                                    [facility] IS NOT RESPONSIBLE  FOR ANY BELONGINGS.          BRING ALL PRESCRIPTION MEDICATIONS WITH YOU THE DAY OF SURGERY IN ORIGINAL CONTAINERS                                                                .[facility] - Preparing for Surgery Before surgery, you can play an important role.  Because skin is not sterile, your skin needs to be as free of germs as possible.  You can reduce the number of germs on your skin by washing with CHG (chlorahexidine gluconate) soap before surgery.  CHG is an antiseptic cleaner which kills germs and bonds with the skin to continue killing germs even after washing. Please DO NOT use if you have an allergy to CHG or antibacterial soaps.  If your skin becomes reddened/irritated stop using the CHG and inform your nurse when you arrive at Short Stay. Do not shave (including legs and underarms) for at least 48 hours prior to the first CHG shower.  You may shave your face/neck. Please follow these instructions carefully:  1.  Shower with CHG Soap the night before surgery and  the  morning of Surgery.  2.  If you choose to wash your hair, wash your hair first as usual with your  normal  shampoo.  3.  After you shampoo, rinse your hair and body thoroughly to remove the  shampoo.                           4.  Use CHG as you would any other liquid soap.  You can  apply chg directly  to the skin and wash                       Gently with a scrungie or clean washcloth.  5.  Apply the CHG Soap to your body ONLY FROM THE NECK DOWN.   Do not use on face/ open                           Wound or open sores. Avoid contact with eyes, ears mouth and genitals (private parts).                       Wash face,  Genitals (private parts) with your normal soap.             6.  Wash thoroughly, paying special attention to the area where your surgery  will be performed.  7.  Thoroughly rinse your body with warm water from the neck down.  8.  DO NOT shower/wash with your normal soap after using and rinsing off  the CHG Soap.                9.  Pat yourself dry with a clean towel.            10.  Wear clean pajamas.            11.  Place clean sheets on your bed the night of your first shower and do not  sleep with pets. Day of Surgery : Do not apply any lotions/deodorants the morning of surgery.  Please wear clean clothes to the hospital/surgery center.  FAILURE TO FOLLOW THESE INSTRUCTIONS MAY RESULT IN THE CANCELLATION OF YOUR SURGERY PATIENT SIGNATURE_________________________________  NURSE SIGNATURE__________________________________  ________________________________________________________________________

## 2019-06-28 NOTE — Progress Notes (Signed)
Can you please place some orders for upcoming surgery.Pt. PST appointment on 06/29/19.Thank you.

## 2019-06-29 ENCOUNTER — Encounter (HOSPITAL_COMMUNITY)
Admission: RE | Admit: 2019-06-29 | Discharge: 2019-06-29 | Disposition: A | Discharge status: Home / Self Care | Payer: BC Managed Care – PPO | Source: Ambulatory Visit | Attending: Obstetrics and Gynecology | Admitting: Obstetrics and Gynecology

## 2019-06-29 ENCOUNTER — Other Ambulatory Visit: Payer: Self-pay

## 2019-06-29 ENCOUNTER — Encounter (HOSPITAL_COMMUNITY): Payer: Self-pay

## 2019-06-29 DIAGNOSIS — Z01812 Encounter for preprocedural laboratory examination: Secondary | ICD-10-CM | POA: Diagnosis not present

## 2019-06-29 HISTORY — DX: Anemia, unspecified: D64.9

## 2019-06-29 LAB — CBC
HCT: 34.5 % — ABNORMAL LOW (ref 36.0–46.0)
Hemoglobin: 11.4 g/dL — ABNORMAL LOW (ref 12.0–15.0)
MCH: 30.1 pg (ref 26.0–34.0)
MCHC: 33 g/dL (ref 30.0–36.0)
MCV: 91 fL (ref 80.0–100.0)
Platelets: 154 10*3/uL (ref 150–400)
RBC: 3.79 MIL/uL — ABNORMAL LOW (ref 3.87–5.11)
RDW: 12.5 % (ref 11.5–15.5)
WBC: 5.9 10*3/uL (ref 4.0–10.5)
nRBC: 0 % (ref 0.0–0.2)

## 2019-06-29 LAB — ABO/RH: ABO/RH(D): O POS

## 2019-06-29 NOTE — Progress Notes (Signed)
PCP - Dr. Shirley Martinique. LOV: 12/22/18 Cardiologist -   Chest x-ray -  EKG -  Stress Test -  ECHO -  Cardiac Cath -   Sleep Study -  CPAP -   Fasting Blood Sugar -  Checks Blood Sugar _____ times a day  Blood Thinner Instructions: Aspirin Instructions: Last Dose:  Anesthesia review:   Patient denies shortness of breath, fever, cough and chest pain at PAT appointment   Patient verbalized understanding of instructions that were given to them at the PAT appointment. Patient was also instructed that they will need to review over the PAT instructions again at home before surgery.

## 2019-06-30 ENCOUNTER — Other Ambulatory Visit (HOSPITAL_COMMUNITY)
Admission: RE | Admit: 2019-06-30 | Discharge: 2019-06-30 | Disposition: A | Discharge status: Home / Self Care | Payer: BC Managed Care – PPO | Source: Ambulatory Visit | Attending: Obstetrics and Gynecology | Admitting: Obstetrics and Gynecology

## 2019-06-30 DIAGNOSIS — Z01812 Encounter for preprocedural laboratory examination: Secondary | ICD-10-CM | POA: Insufficient documentation

## 2019-06-30 DIAGNOSIS — Z20822 Contact with and (suspected) exposure to covid-19: Secondary | ICD-10-CM | POA: Diagnosis not present

## 2019-06-30 LAB — SARS CORONAVIRUS 2 (TAT 6-24 HRS): SARS Coronavirus 2: NEGATIVE

## 2019-07-04 ENCOUNTER — Encounter (HOSPITAL_BASED_OUTPATIENT_CLINIC_OR_DEPARTMENT_OTHER): Payer: Self-pay | Admitting: Obstetrics and Gynecology

## 2019-07-04 ENCOUNTER — Ambulatory Visit (HOSPITAL_BASED_OUTPATIENT_CLINIC_OR_DEPARTMENT_OTHER): Payer: BC Managed Care – PPO | Admitting: Anesthesiology

## 2019-07-04 ENCOUNTER — Ambulatory Visit (HOSPITAL_BASED_OUTPATIENT_CLINIC_OR_DEPARTMENT_OTHER)
Admission: RE | Admit: 2019-07-04 | Discharge: 2019-07-04 | Disposition: A | Discharge status: Home / Self Care | Payer: BC Managed Care – PPO | Attending: Obstetrics and Gynecology | Admitting: Obstetrics and Gynecology

## 2019-07-04 ENCOUNTER — Encounter (HOSPITAL_BASED_OUTPATIENT_CLINIC_OR_DEPARTMENT_OTHER)
Admission: RE | Disposition: A | Discharge status: Home / Self Care | Payer: Self-pay | Source: Home / Self Care | Attending: Obstetrics and Gynecology

## 2019-07-04 DIAGNOSIS — N920 Excessive and frequent menstruation with regular cycle: Secondary | ICD-10-CM | POA: Diagnosis not present

## 2019-07-04 DIAGNOSIS — N979 Female infertility, unspecified: Secondary | ICD-10-CM | POA: Insufficient documentation

## 2019-07-04 DIAGNOSIS — D252 Subserosal leiomyoma of uterus: Secondary | ICD-10-CM | POA: Insufficient documentation

## 2019-07-04 DIAGNOSIS — D250 Submucous leiomyoma of uterus: Secondary | ICD-10-CM | POA: Insufficient documentation

## 2019-07-04 DIAGNOSIS — N736 Female pelvic peritoneal adhesions (postinfective): Secondary | ICD-10-CM | POA: Insufficient documentation

## 2019-07-04 DIAGNOSIS — D251 Intramural leiomyoma of uterus: Secondary | ICD-10-CM | POA: Insufficient documentation

## 2019-07-04 DIAGNOSIS — Z79899 Other long term (current) drug therapy: Secondary | ICD-10-CM | POA: Diagnosis not present

## 2019-07-04 DIAGNOSIS — D630 Anemia in neoplastic disease: Secondary | ICD-10-CM | POA: Diagnosis not present

## 2019-07-04 DIAGNOSIS — D259 Leiomyoma of uterus, unspecified: Secondary | ICD-10-CM | POA: Diagnosis not present

## 2019-07-04 DIAGNOSIS — N803 Endometriosis of pelvic peritoneum: Secondary | ICD-10-CM | POA: Diagnosis not present

## 2019-07-04 DIAGNOSIS — N809 Endometriosis, unspecified: Secondary | ICD-10-CM | POA: Diagnosis not present

## 2019-07-04 HISTORY — PX: LAPAROSCOPIC GELPORT ASSISTED MYOMECTOMY: SHX6549

## 2019-07-04 LAB — TYPE AND SCREEN
ABO/RH(D): O POS
Antibody Screen: NEGATIVE

## 2019-07-04 LAB — POCT PREGNANCY, URINE: Preg Test, Ur: NEGATIVE

## 2019-07-04 SURGERY — LAPAROSCOPIC GELPORT ASSISTED MYOMECTOMY
Anesthesia: General | Site: Abdomen

## 2019-07-04 MED ORDER — OXYCODONE-ACETAMINOPHEN 7.5-325 MG PO TABS: 1.0000 | ORAL_TABLET | ORAL | 0 refills | Status: DC | PRN

## 2019-07-04 MED ORDER — ONDANSETRON HCL 4 MG/2ML IJ SOLN
INTRAMUSCULAR | Status: AC
  Filled 2019-07-04: qty 2

## 2019-07-04 MED ORDER — PROPOFOL 10 MG/ML IV BOLUS
INTRAVENOUS | Status: DC | PRN
  Administered 2019-07-04: 150 mg via INTRAVENOUS
  Administered 2019-07-04: 20 mg via INTRAVENOUS

## 2019-07-04 MED ORDER — SODIUM CHLORIDE 0.9 % IR SOLN
Status: DC | PRN
  Administered 2019-07-04: 6000 mL

## 2019-07-04 MED ORDER — EPHEDRINE 5 MG/ML INJ
INTRAVENOUS | Status: AC
  Filled 2019-07-04: qty 10

## 2019-07-04 MED ORDER — ACETAMINOPHEN 500 MG PO TABS
1000.0000 mg | ORAL_TABLET | Freq: Once | ORAL | Status: AC
  Administered 2019-07-04: 1000 mg via ORAL
  Filled 2019-07-04: qty 2

## 2019-07-04 MED ORDER — SCOPOLAMINE 1 MG/3DAYS TD PT72
1.0000 | MEDICATED_PATCH | TRANSDERMAL | Status: DC
  Administered 2019-07-04: 1.5 mg via TRANSDERMAL
  Filled 2019-07-04: qty 1

## 2019-07-04 MED ORDER — MIDAZOLAM HCL 2 MG/2ML IJ SOLN
INTRAMUSCULAR | Status: DC | PRN
  Administered 2019-07-04: 2 mg via INTRAVENOUS

## 2019-07-04 MED ORDER — MIDAZOLAM HCL 2 MG/2ML IJ SOLN
INTRAMUSCULAR | Status: AC
  Filled 2019-07-04: qty 2

## 2019-07-04 MED ORDER — PROPOFOL 10 MG/ML IV BOLUS
INTRAVENOUS | Status: AC
  Filled 2019-07-04: qty 20

## 2019-07-04 MED ORDER — HYDROMORPHONE HCL 1 MG/ML IJ SOLN
INTRAMUSCULAR | Status: AC
  Filled 2019-07-04: qty 1

## 2019-07-04 MED ORDER — LIDOCAINE HCL 2 % IJ SOLN
INTRAMUSCULAR | Status: AC
  Filled 2019-07-04: qty 20

## 2019-07-04 MED ORDER — METHYLENE BLUE 0.5 % INJ SOLN
INTRAVENOUS | Status: DC | PRN
  Administered 2019-07-04: 2 mL

## 2019-07-04 MED ORDER — LIDOCAINE 2% (20 MG/ML) 5 ML SYRINGE
INTRAMUSCULAR | Status: AC
  Filled 2019-07-04: qty 5

## 2019-07-04 MED ORDER — SUGAMMADEX SODIUM 200 MG/2ML IV SOLN
INTRAVENOUS | Status: DC | PRN
  Administered 2019-07-04: 200 mg via INTRAVENOUS

## 2019-07-04 MED ORDER — OXYCODONE HCL 5 MG/5ML PO SOLN
5.0000 mg | Freq: Once | ORAL | Status: DC | PRN
  Filled 2019-07-04: qty 5

## 2019-07-04 MED ORDER — CEFAZOLIN SODIUM-DEXTROSE 2-4 GM/100ML-% IV SOLN
2.0000 g | INTRAVENOUS | Status: AC
  Administered 2019-07-04: 2 g via INTRAVENOUS
  Filled 2019-07-04: qty 100

## 2019-07-04 MED ORDER — DEXAMETHASONE SODIUM PHOSPHATE 10 MG/ML IJ SOLN
INTRAMUSCULAR | Status: AC
  Filled 2019-07-04: qty 1

## 2019-07-04 MED ORDER — SCOPOLAMINE 1 MG/3DAYS TD PT72
MEDICATED_PATCH | TRANSDERMAL | Status: AC
  Filled 2019-07-04: qty 1

## 2019-07-04 MED ORDER — ONDANSETRON HCL 4 MG PO TABS: 4.0000 mg | ORAL_TABLET | Freq: Every day | ORAL | 1 refills | Status: AC | PRN

## 2019-07-04 MED ORDER — VASOPRESSIN 20 UNIT/ML IV SOLN
INTRAVENOUS | Status: DC | PRN
  Administered 2019-07-04: 60 mL via INTRAMUSCULAR

## 2019-07-04 MED ORDER — ONDANSETRON HCL 4 MG/2ML IJ SOLN
INTRAMUSCULAR | Status: DC | PRN
  Administered 2019-07-04 (×2): 4 mg via INTRAVENOUS

## 2019-07-04 MED ORDER — HYDROMORPHONE HCL 1 MG/ML IJ SOLN
0.2500 mg | INTRAMUSCULAR | Status: DC | PRN
  Administered 2019-07-04 (×2): 0.5 mg via INTRAVENOUS
  Filled 2019-07-04: qty 0.5

## 2019-07-04 MED ORDER — KETOROLAC TROMETHAMINE 30 MG/ML IJ SOLN
30.0000 mg | Freq: Once | INTRAMUSCULAR | Status: DC | PRN
  Filled 2019-07-04: qty 1

## 2019-07-04 MED ORDER — FENTANYL CITRATE (PF) 100 MCG/2ML IJ SOLN
INTRAMUSCULAR | Status: DC | PRN
  Administered 2019-07-04 (×4): 50 ug via INTRAVENOUS

## 2019-07-04 MED ORDER — CEFAZOLIN SODIUM-DEXTROSE 2-4 GM/100ML-% IV SOLN
INTRAVENOUS | Status: AC
  Filled 2019-07-04: qty 100

## 2019-07-04 MED ORDER — ONDANSETRON HCL 4 MG/2ML IJ SOLN
4.0000 mg | Freq: Once | INTRAMUSCULAR | Status: DC | PRN
  Filled 2019-07-04: qty 2

## 2019-07-04 MED ORDER — KETOROLAC TROMETHAMINE 30 MG/ML IJ SOLN
INTRAMUSCULAR | Status: DC | PRN
  Administered 2019-07-04: 30 mg via INTRAVENOUS

## 2019-07-04 MED ORDER — DEXAMETHASONE SODIUM PHOSPHATE 10 MG/ML IJ SOLN
INTRAMUSCULAR | Status: DC | PRN
  Administered 2019-07-04: 10 mg via INTRAVENOUS

## 2019-07-04 MED ORDER — LIDOCAINE 2% (20 MG/ML) 5 ML SYRINGE
INTRAMUSCULAR | Status: DC | PRN
  Administered 2019-07-04: 1.5 mg/kg/h via INTRAVENOUS

## 2019-07-04 MED ORDER — LACTATED RINGERS IV SOLN
INTRAVENOUS | Status: DC
  Filled 2019-07-04: qty 1000

## 2019-07-04 MED ORDER — OXYCODONE HCL 5 MG PO TABS
5.0000 mg | ORAL_TABLET | Freq: Once | ORAL | Status: DC | PRN
  Filled 2019-07-04: qty 1

## 2019-07-04 MED ORDER — BUPIVACAINE-EPINEPHRINE 0.5% -1:200000 IJ SOLN
INTRAMUSCULAR | Status: DC | PRN
  Administered 2019-07-04: 50 mL

## 2019-07-04 MED ORDER — KETAMINE HCL 10 MG/ML IJ SOLN
INTRAMUSCULAR | Status: DC | PRN
  Administered 2019-07-04: 30 mg via INTRAVENOUS

## 2019-07-04 MED ORDER — ROCURONIUM BROMIDE 10 MG/ML (PF) SYRINGE
PREFILLED_SYRINGE | INTRAVENOUS | Status: DC | PRN
  Administered 2019-07-04: 60 mg via INTRAVENOUS

## 2019-07-04 MED ORDER — KETOROLAC TROMETHAMINE 30 MG/ML IJ SOLN
INTRAMUSCULAR | Status: AC
  Filled 2019-07-04: qty 1

## 2019-07-04 MED ORDER — ACETAMINOPHEN 500 MG PO TABS
ORAL_TABLET | ORAL | Status: AC
  Filled 2019-07-04: qty 1

## 2019-07-04 MED ORDER — KETAMINE HCL 10 MG/ML IJ SOLN
INTRAMUSCULAR | Status: AC
  Filled 2019-07-04: qty 1

## 2019-07-04 MED ORDER — FENTANYL CITRATE (PF) 250 MCG/5ML IJ SOLN
INTRAMUSCULAR | Status: AC
  Filled 2019-07-04: qty 5

## 2019-07-04 MED ORDER — LIDOCAINE 2% (20 MG/ML) 5 ML SYRINGE
INTRAMUSCULAR | Status: DC | PRN
  Administered 2019-07-04: 100 mg via INTRAVENOUS

## 2019-07-04 SURGICAL SUPPLY — 67 items
BAG RETRIEVAL 10 (BASKET)
BAG RETRIEVAL 10MM (BASKET)
BLADE EXTENDED COATED 6.5IN (ELECTRODE) IMPLANT
BLADE HEX COATED 2.75 (ELECTRODE) ×3 IMPLANT
BLADE SURG 10 STRL SS (BLADE) ×3 IMPLANT
CABLE HIGH FREQUENCY MONO STRZ (ELECTRODE) ×3 IMPLANT
CLEANER CAUTERY TIP 5X5 PAD (MISCELLANEOUS) ×1 IMPLANT
COVER MAYO STAND STRL (DRAPES) ×3 IMPLANT
COVER WAND RF STERILE (DRAPES) ×3 IMPLANT
DERMABOND ADVANCED (GAUZE/BANDAGES/DRESSINGS) ×2
DERMABOND ADVANCED .7 DNX12 (GAUZE/BANDAGES/DRESSINGS) ×1 IMPLANT
DRSG OPSITE POSTOP 3X4 (GAUZE/BANDAGES/DRESSINGS) ×3 IMPLANT
DRSG TEGADERM 4X4.75 (GAUZE/BANDAGES/DRESSINGS) IMPLANT
DRSG TELFA 3X8 NADH (GAUZE/BANDAGES/DRESSINGS) ×3 IMPLANT
DURAPREP 26ML APPLICATOR (WOUND CARE) ×3 IMPLANT
ELECT NEEDLE TIP 2.8 STRL (NEEDLE) IMPLANT
ELECT REM PT RETURN 9FT ADLT (ELECTROSURGICAL) ×3
ELECTRODE REM PT RTRN 9FT ADLT (ELECTROSURGICAL) ×1 IMPLANT
GAUZE 4X4 16PLY RFD (DISPOSABLE) ×3 IMPLANT
GLOVE BIO SURGEON STRL SZ8 (GLOVE) ×12 IMPLANT
GOWN STRL REUS W/TWL XL LVL3 (GOWN DISPOSABLE) ×12 IMPLANT
HOLDER FOLEY CATH W/STRAP (MISCELLANEOUS) ×3 IMPLANT
IV NS IRRIG 3000ML ARTHROMATIC (IV SOLUTION) ×6 IMPLANT
KIT TURNOVER CYSTO (KITS) ×3 IMPLANT
MANIPULATOR UTERINE 4.5 ZUMI (MISCELLANEOUS) ×3 IMPLANT
NDL SAFETY ECLIPSE 18X1.5 (NEEDLE) IMPLANT
NEEDLE HYPO 18GX1.5 SHARP (NEEDLE)
NEEDLE HYPO 22GX1.5 SAFETY (NEEDLE) ×6 IMPLANT
NEEDLE INSUFFLATION 120MM (ENDOMECHANICALS) ×3 IMPLANT
NS IRRIG 500ML POUR BTL (IV SOLUTION) ×3 IMPLANT
PACK LAPAROSCOPY BASIN (CUSTOM PROCEDURE TRAY) ×3 IMPLANT
PACK TRENDGUARD 450 HYBRID PRO (MISCELLANEOUS) IMPLANT
PAD CLEANER CAUTERY TIP 5X5 (MISCELLANEOUS) ×2
PAD OB MATERNITY 4.3X12.25 (PERSONAL CARE ITEMS) ×3 IMPLANT
PENCIL BUTTON HOLSTER BLD 10FT (ELECTRODE) ×3 IMPLANT
POUCH LAPAROSCOPIC INSTRUMENT (MISCELLANEOUS) ×3 IMPLANT
SEPRAFILM MEMBRANE 5X6 (MISCELLANEOUS) IMPLANT
SET IRRIG TUBING LAPAROSCOPIC (IRRIGATION / IRRIGATOR) ×3 IMPLANT
SET TRI-LUMEN FLTR TB AIRSEAL (TUBING) IMPLANT
SET TUBE SMOKE EVAC HIGH FLOW (TUBING) ×3 IMPLANT
SHEARS HARMONIC ACE PLUS 36CM (ENDOMECHANICALS) ×3 IMPLANT
SPONGE LAP 18X18 RF (DISPOSABLE) IMPLANT
SPONGE LAP 4X18 RFD (DISPOSABLE) ×3 IMPLANT
STOPCOCK 4 WAY LG BORE MALE ST (IV SETS) IMPLANT
SUT MNCRL AB 4-0 PS2 18 (SUTURE) ×6 IMPLANT
SUT VIC AB 0 CT1 36 (SUTURE) IMPLANT
SUT VIC AB 2-0 CT1 (SUTURE) ×12 IMPLANT
SUT VIC AB 2-0 CT2 27 (SUTURE) ×12 IMPLANT
SUT VIC AB 2-0 UR6 27 (SUTURE) IMPLANT
SUT VIC AB 4-0 SH 27 (SUTURE) ×8
SUT VIC AB 4-0 SH 27XANBCTRL (SUTURE) ×4 IMPLANT
SYR 30ML LL (SYRINGE) ×3 IMPLANT
SYR 50ML LL SCALE MARK (SYRINGE) IMPLANT
SYR 5ML LL (SYRINGE) ×3 IMPLANT
SYR CONTROL 10ML LL (SYRINGE) ×6 IMPLANT
SYS BAG RETRIEVAL 10MM (BASKET)
SYS LAPSCP GELPORT 120MM (MISCELLANEOUS) ×3
SYSTEM BAG RETRIEVAL 10MM (BASKET) IMPLANT
SYSTEM LAPSCP GELPORT 120MM (MISCELLANEOUS) ×1 IMPLANT
TOWEL OR 17X26 10 PK STRL BLUE (TOWEL DISPOSABLE) ×3 IMPLANT
TRAY FOLEY W/BAG SLVR 14FR (SET/KITS/TRAYS/PACK) ×3 IMPLANT
TRENDGUARD 450 HYBRID PRO PACK (MISCELLANEOUS)
TROCAR OPTI TIP 5M 100M (ENDOMECHANICALS) ×6 IMPLANT
TROCAR PORT AIRSEAL 5X120 (TROCAR) IMPLANT
TROCAR XCEL DIL TIP R 11M (ENDOMECHANICALS) IMPLANT
WARMER LAPAROSCOPE (MISCELLANEOUS) ×3 IMPLANT
WATER STERILE IRR 500ML POUR (IV SOLUTION) IMPLANT

## 2019-07-04 NOTE — Transfer of Care (Signed)
    Last Vitals:  Vitals Value Taken Time  BP 117/75 07/04/19 1504  Temp 36.5 C 07/04/19 1508  Pulse 73 07/04/19 1511  Resp 16 07/04/19 1511  SpO2 100 % 07/04/19 1511  Vitals shown include unvalidated device data.  Last Pain:  Vitals:   07/04/19 1508  TempSrc: Oral  PainSc:       Patients Stated Pain Goal: 5 (07/04/19 [id number])  Immediate Anesthesia Transfer of Care Note  Patient: Shivali Youdom Ngatcheu  Procedure(s) Performed: Procedure(s) (LRB): LAPAROSCOPIC [place] ASSISTED MYOMECTOMY ABLATION OF ENDOMETRIOSIS  TRANSMYOMETRIAL HYSTEROSCOPY LYSIS OF ADHESIONS AND CHROMOTUBATION (N/A)  Patient Location: PACU  Anesthesia Type: General  Level of Consciousness: awake, alert  and oriented  Airway & Oxygen Therapy: Patient Spontanous Breathing and Patient connected to nasal cannula oxygen  Post-op Assessment: Report given to PACU RN and Post -op Vital signs reviewed and stable  Post vital signs: Reviewed and stable  Complications: No apparent anesthesia complications

## 2019-07-04 NOTE — Anesthesia Procedure Notes (Signed)
Procedure Name: Intubation Date/Time: 07/04/2019 12:30 PM Performed by: Mechele Claude, CRNA Pre-anesthesia Checklist: Patient identified, Emergency Drugs available, Suction available and Patient being monitored Patient Re-evaluated:Patient Re-evaluated prior to induction Oxygen Delivery Method: Circle system utilized Preoxygenation: Pre-oxygenation with 100% oxygen Induction Type: IV induction Ventilation: Mask ventilation without difficulty Laryngoscope Size: Mac and 3 Grade View: Grade I Tube type: Oral Number of attempts: 1 Airway Equipment and Method: Stylet and Oral airway Placement Confirmation: ETT inserted through vocal cords under direct vision,  positive ETCO2 and breath sounds checked- equal and bilateral Secured at: 21 cm Tube secured with: Tape Dental Injury: Teeth and Oropharynx as per pre-operative assessment

## 2019-07-04 NOTE — Discharge Instructions (Signed)
Post Anesthesia Home Care Instructions  Activity: Get plenty of rest for the remainder of the day. A responsible adult should stay with you for 24 hours following the procedure.  For the next 24 hours, DO NOT: -Drive a car -Operate machinery -Drink alcoholic beverages -Take any medication unless instructed by your physician -Make any legal decisions or sign important papers.  Meals: Start with liquid foods such as gelatin or soup. Progress to regular foods as tolerated. Avoid greasy, spicy, heavy foods. If nausea and/or vomiting occur, drink only clear liquids until the nausea and/or vomiting subsides. Call your physician if vomiting continues.  Special Instructions/Symptoms: Your throat may feel dry or sore from the anesthesia or the breathing tube placed in your throat during surgery. If this causes discomfort, gargle with warm salt water. The discomfort should disappear within 24 hours.  If you had a scopolamine patch placed behind your ear for the management of post- operative nausea and/or vomiting:  1. The medication in the patch is effective for 72 hours, after which it should be removed.  Wrap patch in a tissue and discard in the trash. Wash hands thoroughly with soap and water. 2. You may remove the patch earlier than 72 hours if you experience unpleasant side effects which may include dry mouth, dizziness or visual disturbances. 3. Avoid touching the patch. Wash your hands with soap and water after contact with the patch.   DISCHARGE INSTRUCTIONS: Laparoscopy  The following instructions have been prepared to help you care for yourself upon your return home today.  Wound care: . Do not get the incision wet for the first 24 hours. The incision should be kept clean and dry. . The Band-Aids or dressings may be removed the day after surgery. . Should the incision become sore, red, and swollen after the first week, check with your doctor.  Personal hygiene: . Shower the day  after your procedure.  Activity and limitations: . Do NOT drive or operate any equipment today. . Do NOT lift anything more than 15 pounds for 2-3 weeks after surgery. . Do NOT rest in bed all day. . Walking is encouraged. Walk each day, starting slowly with 5-minute walks 3 or 4 times a day. Slowly increase the length of your walks. . Walk up and down stairs slowly. . Do NOT do strenuous activities, such as golfing, playing tennis, bowling, running, biking, weight lifting, gardening, mowing, or vacuuming for 2-4 weeks. Ask your doctor when it is okay to start.  Diet: Eat a light meal as desired this evening. You may resume your usual diet tomorrow.  Return to work: This is dependent on the type of work you do. For the most part you can return to a desk job within a week of surgery. If you are more active at work, please discuss this with your doctor.  What to expect after your surgery: You may have a slight burning sensation when you urinate on the first day. You may have a very small amount of blood in the urine. Expect to have a small amount of vaginal discharge/light bleeding for 1-2 weeks. It is not unusual to have abdominal soreness and bruising for up to 2 weeks. You may be tired and need more rest for about 1 week. You may experience shoulder pain for 24-72 hours. Lying flat in bed may relieve it.  Call your doctor for any of the following: . Develop a fever of 100.4 or greater . Inability to urinate 6 hours after discharge   from hospital . Severe pain not relieved by pain medications . Persistent of heavy bleeding at incision site . Redness or swelling around incision site after a week . Increasing nausea or vomiting  Patient Signature________________________________________ Nurse Signature_________________________________________ 

## 2019-07-04 NOTE — H&P (Addendum)
[name] is a 34 y.o. female , originally referred to me by Dr. Harolyn Rutherford, for myomectomy.  She was diagnosed with fibroids because of abnormal uterine bleeding and was placed on oral contraceptives.  She developed breakthrough bleeding and had to stop the pills.  She has been having monthly periods but with heavy flow and prolonged duration.  Patient would like to preserve her childbearing potential. TRANSVAGINAL ULTRASOUND: 01/2019 Anteverted uterus. Endometrial lining is echogenic with 2 myomas appearing to distort the lining. 1. Left lateral SM 1.4 x 1.0 x 1.1cm 2. Fundal SM 1.4 x 1.0 x 1.6cm 3. Left lateral IM 2.3 x 1.7 x 2.0cm 4. Posterior SS 2.4 x 2.3 x 1.9cm 5. Anterior IM 1.2 x 0.8 x 1.5cm  Right ovary with AFC of 4 with a 45mm CL. Left ovary with AFC of 5.  However HSG showed only uterine cavity contour changes compatible with type II submucosal myomas (no true filling defects consistent with type 0 submucosal myomas) at the right fundal and lower uterine segment, which is why a laparoscopic transmyometrial approach is being taken to remove these myomas.  Pertinent Gynecological History: Menses: flow is excessive with use of 3 pads or tampons on heaviest days Bleeding: dysfunctional uterine bleeding Contraception: none DES exposure: denies Blood transfusions: none Sexually transmitted diseases: no past history Last mammogram: normal Last pap: normal  OB History: C/S   Menstrual History: Menarche age: 18 No LMP recorded.    Past Medical History:  Diagnosis Date  . Anemia   . Fibroid   . Fibroids                     Past Surgical History:  Procedure Laterality Date  . CESAREAN SECTION               History reviewed. No pertinent family history. No hereditary disease.  No cancer of breast, ovary, uterus. No cutaneous leiomyomatosis or renal cell carcinoma.  Social History   Socioeconomic History  . Marital status: Married    Spouse name: Not on  file  . Number of children: Not on file  . Years of education: Not on file  . Highest education level: Not on file  Occupational History  . Not on file  Tobacco Use  . Smoking status: Never Smoker  . Smokeless tobacco: Never Used  Substance and Sexual Activity  . Alcohol use: Yes    Comment: social  . Drug use: No  . Sexual activity: Yes    Birth control/protection: None  Other Topics Concern  . Not on file  Social History Narrative   ** Merged History Encounter **       Social Determinants of Health   Financial Resource Strain:   . Difficulty of Paying Living Expenses:   Food Insecurity:   . Worried About Charity fundraiser in the Last Year:   . Arboriculturist in the Last Year:   Transportation Needs:   . Film/video editor (Medical):   Marland Kitchen Lack of Transportation (Non-Medical):   Physical Activity:   . Days of Exercise per Week:   . Minutes of Exercise per Session:   Stress:   . Feeling of Stress :   Social Connections:   . Frequency of Communication with Friends and Family:   . Frequency of Social Gatherings with Friends and Family:   . Attends Religious Services:   . Active Member of Clubs or Organizations:   . Attends Club or  Organization Meetings:   Marland Kitchen Marital Status:   Intimate Partner Violence:   . Fear of Current or Ex-Partner:   . Emotionally Abused:   Marland Kitchen Physically Abused:   . Sexually Abused:     No Known Allergies  No current facility-administered medications on file prior to encounter.   Current Outpatient Medications on File Prior to Encounter  Medication Sig Dispense Refill  . acetaminophen (TYLENOL) 500 MG tablet Take 500 mg by mouth every 6 (six) hours as needed for moderate pain.     . ferrous sulfate 325 (65 FE) MG EC tablet Take 325 mg by mouth daily.        Review of Systems  Constitutional: Negative.   HENT: Negative.   Eyes: Negative.   Respiratory: Negative.   Cardiovascular: Negative.   Gastrointestinal: Negative.    Genitourinary: Negative.   Musculoskeletal: Negative.   Skin: Negative.   Neurological: Negative.   Endo/Heme/Allergies: Negative.   Psychiatric/Behavioral: Negative.      Physical Exam  BP 115/74   Pulse 71   Temp 97.9 F (36.6 C) (Oral)   Resp 16   Ht 5\' 6"  (1.676 m)   Wt 80.4 kg   LMP 07/04/2019 (Exact Date)   SpO2 100%   BMI 28.60 kg/m  Constitutional: She is oriented to person, place, and time. She appears well-developed and well-nourished.  HENT:  Head: Normocephalic and atraumatic.  Nose: Nose normal.  Mouth/Throat: Oropharynx is clear and moist. No oropharyngeal exudate.  Eyes: Conjunctivae normal and EOM are normal. Pupils are equal, round, and reactive to light. No scleral icterus.  Neck: Normal range of motion. Neck supple. No tracheal deviation present. No thyromegaly present.  Cardiovascular: Normal rate.   Respiratory: Effort normal and breath sounds normal.  GI: Soft. Bowel sounds are normal. She exhibits no distension and no mass. There is no tenderness.  Lymphadenopathy:    She has no cervical adenopathy.  Neurological: She is alert and oriented to person, place, and time. She has normal reflexes.  Skin: Skin is warm.  Psychiatric: She has a normal mood and affect. Her behavior is normal. Judgment and thought content normal.       Assessment/Plan:  Intramural and subserosal uterine myomas, causing menorrhagia and pressure sensation. Preoperative for  assisted myomectomy Benefits and risks of robotic myomectomy were discussed with the patient and her family member again.  Bowel prep instructions were given.  All of patient's questions were answered.  She verbalized understanding.  She knows that she will need a cesarean delivery for future pregnancies, and that it is recommended she does not conceive for 2-3 months for uterus to heal.

## 2019-07-04 NOTE — Op Note (Addendum)
Operative Note  Preoperative diagnosis: Uterine fibroids, menorrhagia, infertility  Postoperative diagnosis: Uterine fibroids, subserosal, intramural, and submucosal, menorrhagia, endometriosis of pelvic peritoneum, stage I, pelvic adhesions, infertility  Procedure: Laparoscopy, lysis of adhesions, GelPort assisted myomectomy (x 19), transmyometrial hysteroscopy, vaporization of pelvic endometriosis, chromotubation  Anesthesia: Gen. endotracheal  Complications: None  Estimated blood loss: 50 mL  Specimens: Uterine myomas to pathology  Findings: On examination under anesthesia, external genitalia, Bartholin's, Skene's, and urethra were normal. The vagina was normal. The cervix was nulliparous and appeared grossly normal. The uterus was top of normal size, firm and mobile with irregularities caused by myomas. It sounded to 7.5 cm.  On laparoscopy, upper abdomen, liver surface and diaphragm surfaces were normal. Gallbladder was normal. The appendix was not visualized.   There were dense myometrial adhesions between the anterior fundal aspect of the uterus and the midline anterior abdominal wall fascia which were lysed, these adhesions appeared to have formed during the post C-section recovery.   The uterus contained nineteen 2.5-0.3 cm myomas in intramural, subserosal, and type II submucosal locations, scattered around the entire uterus.  Normal severity was a 2 cm right transmural/type II submucosal myoma which was removed.  During its removal, endometrial cavity was entered we were able to perform a hysteroscopy through this site of entry and confirmed that the rest of the endometrial cavity was normal.   There were also small less than 1 cm posterior subserosal myomas which were not removed. The left tube and ovary appeared normal. The right tube and ovary appeared normal.  The fimbria were rated at 5 out of 5.  Both tubes were patent to chromotubation with methylene blue. The pelvic  peritoneum looked mostly normal, except for 2 spots of red lesions of endometriosis in the posterior cul-de-sac which were vaporized with needle tip electrode.  Description of the procedure: The patient was placed in dorsal supine position and general endotracheal anesthesia was given. 2 g of cefazolin were given intravenously for prophylaxis. Patient was placed in lithotomy position. She was prepped and draped in sterile manner.  The bladder was straight catheterized . A ZUMI catheter was placed into the uterine cavity. This was connected to a syringe containing diluted methylene blue solution which was used to define the endometrium during the myomectomy. The uterus sounded to 7.5 cm The surgeon was regloved and a surgical field was created on the abdomen.  After preemptive anesthesia of all surgical sites with 0.5% bupivacaine, a 5 mm intraumbilical skin incision was made and a Verress needle was inserted. Its correct location was confirmed. A pneumoperitoneum was created with carbon dioxide.  5 mm laparoscope with a 30 lens was inserted and video laparoscopy was started . A left lower quadrant 5 mm and a right lower quadrant  5 mm incisions were made and ancillary trochars were placed under direct visualization. Above findings were noted. Harmonic Ace was used to dissected the anterior uterine-abdominal wall dense adhesions. Needle electrode with 31 W of cutting current was used to ablate the posterior cul-de-sac peritoneum superficial lesions. A dilute solution of vasopressin (0.4 units per mL) was injected into the myometrium overlying the fundal myoma, until the myometrium blanched. A needle electrode with 73 W of cutting current  was used to make a transverse incision on the myometrium overlying the fundal 2 cm myoma. The myoma was grasped with tenaculum and dissection was started.  We then made a 4 cm transverse suprapubic incision to insert a GelPort. After dissection of the anatomic  layers, the  peritoneal cavity was entered. A GelPort was placed and the rest of the case was performed either using this port as a laparoscopic port or as a minilaparotomy port.  3 other 1.5 cm incisions were made on anterior arm right lateral and midline posterior uterine wall to remove the palpated myomas.  Through these ports several other small myomas were palpated and they were removed by blunt and sharp dissection.  Overall 19 myomas were removed and the size range of 0.3 to 2.5 cm. The myoma defects were closed in 3 layers: The first layer was a deep myometrial suture of 2-0 Vicryl continuous interlocking suture, the second layer was a superficial myometrial layer of 2-0 Vicryl continuous suture. A 4-0 Vicryl continuous suture was placed on the serosa and the most superficial myometrium.  The suprapubic fascial incision was closed with 2-0 Vicryl continuous suture. Subcutaneous tissue was irrigated and aspirated good hemostasis was achieved. The abdomen and the pelvis was carefully inspected under laparoscopic visualization and the pelvis was copiously irrigated and aspirated. A slurry of 2 sheets of Seprafilm in 60 mL of normal saline was injected as an adhesion barrier into the pelvis. The gas was allowed to escape. The instrument and the lap pad count were correct. The trochars were removed. The skin incisions were approximated with 4-0 Monocryl in subcuticular sutures, including the 3 cm skin incision that belonged to the [place] site.  The patient tolerated the procedure well and was transferred to recovery room in satisfactory condition.  SPECIAL NOTE: Because of the extent of the myometrial incision during the uterine myomectomy, it is recommended that this patient deliver by a cesarean section with her future pregnancies.  Governor Specking, MD

## 2019-07-04 NOTE — Anesthesia Preprocedure Evaluation (Addendum)
Anesthesia Evaluation  Patient identified by MRN, date of birth, ID band Patient awake    Reviewed: Allergy & Precautions, NPO status , Patient's Chart, lab work & pertinent test results  Airway Mallampati: I  TM Distance: >3 FB Neck ROM: Full    Dental no notable dental hx. (+) Teeth Intact, Dental Advisory Given   Pulmonary neg pulmonary ROS,    Pulmonary exam normal breath sounds clear to auscultation       Cardiovascular negative cardio ROS Normal cardiovascular exam Rhythm:Regular Rate:Normal     Neuro/Psych negative neurological ROS  negative psych ROS   GI/Hepatic negative GI ROS, Neg liver ROS,   Endo/Other  negative endocrine ROS  Renal/GU negative Renal ROS     Musculoskeletal negative musculoskeletal ROS (+)   Abdominal   Peds  Hematology  (+) anemia , hgb 11.4 plt 154   Anesthesia Other Findings   Reproductive/Obstetrics negative OB ROS                            Anesthesia Physical Anesthesia Plan  ASA: II  Anesthesia Plan: General   Post-op Pain Management:    Induction: Intravenous  PONV Risk Score and Plan: 4 or greater and Treatment may vary due to age or medical condition, Ondansetron, Dexamethasone and Midazolam  Airway Management Planned: Oral ETT  Additional Equipment: None  Intra-op Plan:   Post-operative Plan: Extubation in OR  Informed Consent: I have reviewed the patients History and Physical, chart, labs and discussed the procedure including the risks, benefits and alternatives for the proposed anesthesia with the patient or authorized representative who has indicated his/her understanding and acceptance.     Dental advisory given  Plan Discussed with:   Anesthesia Plan Comments:        Anesthesia Quick Evaluation

## 2019-07-04 NOTE — Anesthesia Postprocedure Evaluation (Signed)
Anesthesia Post Note  Patient: [name]  Procedure(s) Performed: LAPAROSCOPIC GELPORT ASSISTED MYOMECTOMY ABLATION OF ENDOMETRIOSIS  TRANSMYOMETRIAL HYSTEROSCOPY LYSIS OF ADHESIONS AND CHROMOTUBATION (N/A Abdomen)     Patient location during evaluation: PACU Anesthesia Type: General Level of consciousness: awake and alert Pain management: pain level controlled Vital Signs Assessment: post-procedure vital signs reviewed and stable Respiratory status: spontaneous breathing, nonlabored ventilation, respiratory function stable and patient connected to nasal cannula oxygen Cardiovascular status: blood pressure returned to baseline and stable Postop Assessment: no apparent nausea or vomiting Anesthetic complications: no    Last Vitals:  Vitals:   07/04/19 1530 07/04/19 1535  BP: 118/76   Pulse: (!) 57 (!) 58  Resp: 15 15  Temp:    SpO2: 100% 100%    Last Pain:  Vitals:   07/04/19 1508  TempSrc: Oral  PainSc:                  Barnet [place]

## 2019-07-06 LAB — SURGICAL PATHOLOGY

## 2020-06-12 ENCOUNTER — Other Ambulatory Visit: Payer: Self-pay

## 2020-06-12 ENCOUNTER — Ambulatory Visit (INDEPENDENT_AMBULATORY_CARE_PROVIDER_SITE_OTHER): Payer: BC Managed Care – PPO | Admitting: Family Medicine

## 2020-06-12 ENCOUNTER — Encounter: Payer: Self-pay | Admitting: Family Medicine

## 2020-06-12 VITALS — BP 108/74 | HR 72 | Wt 181.6 lb

## 2020-06-12 DIAGNOSIS — O09 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, unspecified trimester: Secondary | ICD-10-CM | POA: Insufficient documentation

## 2020-06-12 DIAGNOSIS — Z3200 Encounter for pregnancy test, result unknown: Secondary | ICD-10-CM

## 2020-06-12 HISTORY — DX: 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, unspecified trimester: O09.00

## 2020-06-12 LAB — POCT URINE PREGNANCY: Preg Test, Ur: POSITIVE — AB

## 2020-06-12 NOTE — Patient Instructions (Signed)
It was a pleasure seeing you today.  We are going to connect your initial OB labs and have scheduled you for an initial OB visit in our clinic.  We are getting the results from your ultrasound and can determine how to proceed once we get those results.  If you have any vaginal bleeding, a gush of fluid, contractions please be evaluated at the maternal assessment unit in the bottom of the [facility].  If you have any questions or concerns please feel free to call the clinic.  I hope you have a wonderful afternoon!   [email or web address]  First Trimester of Pregnancy  The first trimester of pregnancy starts on the first day of your last menstrual period until the end of week 12. This is also called months 1 through 3 of pregnancy. Body changes during your first trimester Your body goes through many changes during pregnancy. The changes usually return to normal after your baby is born. Physical changes  You may gain or lose weight.  Your breasts may grow larger and hurt. The area around your nipples may get darker.  Dark spots or blotches may develop on your face.  You may have changes in your hair. Health changes  You may feel like you might vomit (nauseous), and you may vomit.  You may have heartburn.  You may have headaches.  You may have trouble pooping (constipation).  Your gums may bleed. Other changes  You may get tired easily.  You may pee (urinate) more often.  Your menstrual periods will stop.  You may not feel hungry.  You may want to eat certain kinds of food.  You may have changes in your emotions from day to day.  You may have more dreams. Follow these instructions at home: Medicines  Take over-the-counter and prescription medicines only as told by your doctor. Some medicines are not safe during pregnancy.  Take a prenatal vitamin that contains at least 600 micrograms (mcg) of folic acid. Eating and drinking  Eat healthy  meals that include: ? Fresh fruits and vegetables. ? Whole grains. ? Good sources of protein, such as meat, eggs, or tofu. ? Low-fat dairy products.  Avoid raw meat and unpasteurized juice, milk, and cheese.  If you feel like you may vomit, or you vomit: ? Eat 4 or 5 small meals a day instead of 3 large meals. ? Try eating a few soda crackers. ? Drink liquids between meals instead of during meals.  You may need to take these actions to prevent or treat trouble pooping: ? Drink enough fluids to keep your pee (urine) pale yellow. ? Eat foods that are high in fiber. These include beans, whole grains, and fresh fruits and vegetables. ? Limit foods that are high in fat and sugar. These include fried or sweet foods. Activity  Exercise only as told by your doctor. Most people can do their usual exercise routine during pregnancy.  Stop exercising if you have cramps or pain in your lower belly (abdomen) or low back.  Do not exercise if it is too hot or too humid, or if you are in a place of great height (high altitude).  Avoid heavy lifting.  If you choose to, you may have sex unless your doctor tells you not to. Relieving pain and discomfort  Wear a good support bra if your breasts are sore.  Rest with your legs raised (elevated) if you have leg cramps or low back pain.  If you have bulging veins (varicose  veins) in your legs: ? Wear support hose as told by your doctor. ? Raise your feet for 15 minutes, 3-4 times a day. ? Limit salt in your food. Safety  Wear your seat belt at all times when you are in a car.  Talk with your doctor if someone is hurting you or yelling at you.  Talk with your doctor if you are feeling sad or have thoughts of hurting yourself. Lifestyle  Do not use hot tubs, steam rooms, or saunas.  Do not douche. Do not use tampons or scented sanitary pads.  Do not use herbal medicines, illegal drugs, or medicines that are not approved by your doctor. Do not  drink alcohol.  Do not smoke or use any products that contain nicotine or tobacco. If you need help quitting, ask your doctor.  Avoid cat litter boxes and soil that is used by cats. These carry germs that can cause harm to the baby and can cause a loss of your baby by miscarriage or stillbirth. General instructions  Keep all follow-up visits. This is important.  Ask for help if you need counseling or if you need help with nutrition. Your doctor can give you advice or tell you where to go for help.  Visit your dentist. At home, brush your teeth with a soft toothbrush. Floss gently.  Write down your questions. Take them to your prenatal visits. Where to find more information  American Pregnancy Association: americanpregnancy.org  [employer]: www.acog.org  Office on Women's Health: KeywordPortfolios.com.br Contact a doctor if:  You are dizzy.  You have a fever.  You have mild cramps or pressure in your lower belly.  You have a nagging pain in your belly area.  You continue to feel like you may vomit, you vomit, or you have watery poop (diarrhea) for 24 hours or longer.  You have a bad-smelling fluid coming from your vagina.  You have pain when you pee.  You are exposed to a disease that spreads from person to person, such as chickenpox, measles, Zika virus, HIV, or hepatitis. Get help right away if:  You have spotting or bleeding from your vagina.  You have very bad belly cramping or pain.  You have shortness of breath or chest pain.  You have any kind of injury, such as from a fall or a car crash.  You have new or increased pain, swelling, or redness in an arm or leg. Summary  The first trimester of pregnancy starts on the first day of your last menstrual period until the end of week 12 (months 1 through 3).  Eat 4 or 5 small meals a day instead of 3 large meals.  Do not smoke or use any products that contain nicotine or  tobacco. If you need help quitting, ask your doctor.  Keep all follow-up visits. This information is not intended to replace advice given to you by your health care provider. Make sure you discuss any questions you have with your health care provider. Document Revised: 08/29/2019 Document Reviewed: 07/05/2019 Elsevier Patient Education  2021 [employer].

## 2020-06-12 NOTE — Progress Notes (Signed)
    SUBJECTIVE:   CHIEF COMPLAINT / HPI:   Confirm Pregnancy  Patient reports that she is here for pregnancy management today.  She has been seeing a fertility specialist who completed an ultrasound on 3/8 showing a 83-week old fetus.  We do not have access to this ultrasound so we will have them sent over.  Her last menstrual cycle was 04/29/2020 and was regular.  She has not been on any birth control.  She had one prior pregnancy in 0973 which was complicated by a short cervix for which she took "some medication" for.  Her pregnancy ended in a C-section due to nonreassuring fetal heart tones.  The C-section occurred in [place].  Patient denies any other complications during the pregnancy.  Of note the patient did have a large uterine fibroid removed approximately 1 year ago and she was told by the provider who did the surgery that she could never have a vaginal delivery and she would be required to have a C-section she got pregnant again.  Patient denies any vaginal bleeding, loss of fluid, contractions.   OBJECTIVE:   BP 108/74   Pulse 72   Wt 181 lb 9.6 oz (82.4 kg)   LMP 04/29/2020 (Exact Date)   SpO2 99%   BMI 29.31 kg/m   General: Well-appearing 35 year old female, no acute distress Cardiac: Regular rate and rhythm, no murmur appreciated Respiratory: Normal breathing, lungs clear to auscultation bilaterally Abdomen: Soft, nontender, positive bowel sounds   ASSESSMENT/PLAN:   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 Patient presents for maintenance of her pregnancy.  She has been seeing a fertility specialist and reports that she had an ultrasound 2 days ago which showed a 6-week fetus.  Pregnancy test positive today.  Record release form signed for fertility specialist.  Of note patient has history of the high risk pregnancy with a short cervix.  This patient will most likely need to be managed by high risk OB but we will get the rest of her initial prenatal labs as  well as ultrasound results and determine the need for high risk OB.  The patient wishes to stay with our clinic if possible. -Initial OB labs ordered today -Patient scheduled for initial OB visit -Records release form signed and we will get the results from her ultrasound. -Strict MAU precautions given to the patient.     Gifford Shave, MD [place]

## 2020-06-12 NOTE — Assessment & Plan Note (Addendum)
Patient presents for maintenance of her pregnancy.  She has been seeing a fertility specialist and reports that she had an ultrasound 2 days ago which showed a 6-week fetus.  Pregnancy test positive today.  Record release form signed for fertility specialist.  Of note patient has history of the high risk pregnancy with a short cervix.  This patient will most likely need to be managed by high risk OB but we will get the rest of her initial prenatal labs as well as ultrasound results and determine the need for high risk OB.  The patient wishes to stay with our clinic if possible. -Initial OB labs ordered today -Patient scheduled for initial OB visit -Records release form signed and we will get the results from her ultrasound. -Strict MAU precautions given to the patient.

## 2020-06-14 LAB — CULTURE, OB URINE

## 2020-06-14 LAB — URINE CULTURE, OB REFLEX

## 2020-06-16 LAB — OBSTETRIC PANEL, INCLUDING HIV
Antibody Screen: NEGATIVE
Basophils Absolute: 0 10*3/uL (ref 0.0–0.2)
Basos: 0 %
EOS (ABSOLUTE): 0.2 10*3/uL (ref 0.0–0.4)
Eos: 3 %
HIV Screen 4th Generation wRfx: NONREACTIVE
Hematocrit: 28.4 % — ABNORMAL LOW (ref 34.0–46.6)
Hemoglobin: 8.8 g/dL — ABNORMAL LOW (ref 11.1–15.9)
Hepatitis B Surface Ag: NEGATIVE
Immature Grans (Abs): 0 10*3/uL (ref 0.0–0.1)
Immature Granulocytes: 0 %
Lymphocytes Absolute: 1.3 10*3/uL (ref 0.7–3.1)
Lymphs: 23 %
MCH: 22.9 pg — ABNORMAL LOW (ref 26.6–33.0)
MCHC: 31 g/dL — ABNORMAL LOW (ref 31.5–35.7)
MCV: 74 fL — ABNORMAL LOW (ref 79–97)
Monocytes Absolute: 0.4 10*3/uL (ref 0.1–0.9)
Monocytes: 7 %
Neutrophils Absolute: 3.6 10*3/uL (ref 1.4–7.0)
Neutrophils: 67 %
Platelets: 238 10*3/uL (ref 150–450)
RBC: 3.84 x10E6/uL (ref 3.77–5.28)
RDW: 14.6 % (ref 11.7–15.4)
RPR Ser Ql: NONREACTIVE
Rh Factor: POSITIVE
Rubella Antibodies, IGG: 9.46 index (ref >0.99)
WBC: 5.4 10*3/uL (ref 3.4–10.8)

## 2020-06-16 LAB — HGB FRACTIONATION CASCADE
Hgb A2: 2.1 % (ref 1.8–3.2)
Hgb A: 97.9 % (ref 96.4–98.8)
Hgb F: 0 % (ref 0.0–2.0)
Hgb S: 0 %

## 2020-06-16 LAB — HCV AB W REFLEX TO QUANT PCR: HCV Ab: <0.1 s/co ratio (ref 0.0–0.9)

## 2020-06-16 LAB — HCV INTERPRETATION

## 2020-06-18 ENCOUNTER — Telehealth: Payer: Self-pay

## 2020-06-18 NOTE — Telephone Encounter (Signed)
Patient calls nurse Cherron regarding lab results from visit on 3/10. Patient is also requesting to speak with provider regarding receiving a letter for work regarding intermittent dizziness and fatigue.   Patient denies abdominal pain, bleeding, or SHOB. Advised patient to drink plenty of fluids, change positions slowly. Provided with MAU precautions.   Scheduled in ATC tomorrow morning for follow up. *Precepted with Dr. Owens Shark who agreed with current plan.   Talbot Grumbling, RN

## 2020-06-19 ENCOUNTER — Other Ambulatory Visit: Payer: Self-pay

## 2020-06-19 ENCOUNTER — Ambulatory Visit (INDEPENDENT_AMBULATORY_CARE_PROVIDER_SITE_OTHER): Payer: BC Managed Care – PPO | Admitting: Family Medicine

## 2020-06-19 VITALS — BP 106/62 | HR 77 | Wt 176.6 lb

## 2020-06-19 DIAGNOSIS — O99011 Anemia complicating pregnancy, first trimester: Secondary | ICD-10-CM

## 2020-06-19 DIAGNOSIS — O09 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, unspecified trimester: Secondary | ICD-10-CM | POA: Diagnosis not present

## 2020-06-19 DIAGNOSIS — O26811 Pregnancy related exhaustion and fatigue, first trimester: Secondary | ICD-10-CM | POA: Diagnosis not present

## 2020-06-19 LAB — POCT HEMOGLOBIN: Hemoglobin: 9.3 g/dL — AB (ref 11–14.6)

## 2020-06-19 LAB — GLUCOSE, POCT (MANUAL RESULT ENTRY): POC Glucose: 64 mg/dl — AB (ref 70–99)

## 2020-06-19 MED ORDER — DOXYLAMINE-PYRIDOXINE 10-10 MG PO TBEC: 2.0000 | DELAYED_RELEASE_TABLET | Freq: Every day | ORAL | 0 refills | Status: DC

## 2020-06-19 MED ORDER — FERROUS SULFATE 325 (65 FE) MG PO TBEC: 325.0000 mg | DELAYED_RELEASE_TABLET | Freq: Every day | ORAL | 3 refills | Status: DC

## 2020-06-19 NOTE — Progress Notes (Addendum)
    SUBJECTIVE:   CHIEF COMPLAINT / HPI:   Weakness and dizziness Patient reports that over the last week she has felt nauseous and had decreased appetite.  She has not eaten many meals but has been drinking plenty of water.  She reports she drinks a liter and a half of water each day.  Denies any vaginal bleeding, gush of fluid, contractions.  She reports she just feels so tired and she feels like she sleeps all the time.  She has found nothing that makes it better.  She reports that she is able to eat certain foods, especially spicy foods.  Denies any signs of reflux.  OBJECTIVE:   BP 106/62   Pulse 77   Wt 176 lb 9.6 oz (80.1 kg)   LMP 04/29/2020 (Exact Date)   BMI 28.50 kg/m   General: Laying on exam table, appears sleepy.  Responds appropriately to questions.  Reports that she has had poor p.o. intake. HEENT: Moist mucous membranes, reports she drinks a liter of water a day Cardiac: Regular rate and rhythm, no murmurs appreciated Respiratory: Normal work of breathing, lungs clear to auscultation bilaterally Abdomen: Soft, nontender, positive bowel sounds MSK: No edema noted  ASSESSMENT/PLAN:   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 Spoke with on-call OB/GYN yesterday 3/16 regarding this patient's care and she recommended we just refer her to high risk OB.  Patient is lethargic and has had poor p.o. intake due to nausea and decreased appetite.  Of note we were checking patient's ferritin level today due to her anemia and she became more fatigued and weak.  We provided water and crackers and checked a CBG which came out at 64.  After the water and the crackers the patient began to feel better.  We asked if she would like to go to the MAU and she reported not at this time.  We gave her strict ED precautions. -Prescription sent for Diclegis to patient's pharmacy -Referral placed for high risk OB -Recommended patient take 325 iron daily -Strict ED/MAU precautions given  and patient is agreeable to this -Patient's initial prenatal visit scheduled until she is scheduled with high risk OB     Gifford Shave, MD [place]

## 2020-06-19 NOTE — Patient Instructions (Addendum)
It was a pleasure seeing you today.  I am sorry you are having all of these issues with nausea and weakness, I think it is mostly related to your poor appetite and not consuming enough calories.  I am prescribing a medication called Diclegis.Start with 2 tablets daily at night.  If the symptoms persist after 2 days increase to 1 tablet in the morning and 2 tablets at night.  You may further increase to 1 tablet in the morning, 1 tablet at lunch, and 2 tablets at night.  Max dose of 4 tablets/day.  For your anemia I am going to check your iron levels today and I am going to prescribe iron 325 mg daily.  I have placed a referral to high risk OB and they should be contacting you to schedule your initial OB appointment.  If you have worsening fatigue, dizziness, pass out, vaginal bleeding, cramping, a gush of fluid please be evaluated at the maternal assessment unit in the [facility] of the [facility].  We have scheduled you for a follow-up appointment on Monday 3/21 at 1:50 PM.  Please try to work on eating small meals and drinking plenty of fluids.  You have lost 5 pounds since her previous visit so we will recheck your weight at the next visit and determine the next steps from there.  If you have any questions or concerns please feel free to call the clinic.  I hope you have a wonderful afternoon!   Morning Sickness  Morning sickness is when you feel like you may vomit (feel nauseous) during pregnancy. Sometimes, you may vomit. Morning sickness most often happens in the morning, but it can also happen at any time of the day. Some women may have morning sickness that makes them vomit all the time. This is a more serious problem that needs treatment. What are the causes? The cause of this condition is not known. What increases the risk?  You had vomiting or a feeling like you may vomit before your pregnancy.  You had morning sickness in another pregnancy.  You are pregnant with more than  one baby, such as twins. What are the signs or symptoms?  Feeling like you may vomit.  Vomiting. How is this treated? Treatment is usually not needed for this condition. You may only need to change what you eat. In some cases, your doctor may give you some things to take for your condition. These include:  Vitamin B6 supplements.  Medicines to treat the feeling that you may vomit.  Ginger. Follow these instructions at home: Medicines  Take over-the-counter and prescription medicines only as told by your doctor. Do not take any medicines until you talk with your doctor about them first.  Take multivitamins before you get pregnant. These can stop or lessen the symptoms of morning sickness. Eating and drinking  Eat dry toast or crackers before getting out of bed.  Eat 5 or 6 small meals a day.  Eat dry and bland foods like rice and baked potatoes.  Do not eat greasy, fatty, or spicy foods.  Have someone cook for you if the smell of food causes you to vomit or to feel like you may vomit.  If you feel like you may vomit after taking prenatal vitamins, take them at night or with a snack.  Eat protein foods when you need a snack. Nuts, yogurt, and cheese are good choices.  Drink fluids throughout the day.  Try ginger ale made with real  ginger, ginger tea made from fresh grated ginger, or ginger candies. General instructions  Do not smoke or use any products that contain nicotine or tobacco. If you need help quitting, ask your doctor.  Use an air purifier to keep the air in your house free of smells.  Get lots of fresh air.  Try to avoid smells that make you feel sick.  Try wearing an acupressure wristband. This is a wristband that is used to treat seasickness.  Try a treatment called acupuncture. In this treatment, a doctor puts needles into certain areas of your body to make you feel better. Contact a doctor if:  You need medicine to feel better.  You feel dizzy or  light-headed.  You are losing weight. Get help right away if:  The feeling that you may vomit will not go away, or you cannot stop vomiting.  You faint.  You have very bad pain in your belly. Summary  Morning sickness is when you feel like you may vomit (feel nauseous) during pregnancy.  You may feel sick in the morning, but you can feel this way at any time of the day.  Making some changes to what you eat may help your symptoms go away. This information is not intended to replace advice given to you by your health care provider. Make sure you discuss any questions you have with your health care provider. Document Revised: 11/05/2019 Document Reviewed: 10/15/2019 Elsevier Patient Education  2021 [employer].

## 2020-06-20 LAB — FERRITIN: Ferritin: 26 ng/mL (ref 15–150)

## 2020-06-20 NOTE — Assessment & Plan Note (Addendum)
Spoke with on-call OB/GYN yesterday 3/16 regarding this patient's care and she recommended we just refer her to high risk OB.  Patient is lethargic and has had poor p.o. intake due to nausea and decreased appetite.  Of note we were checking patient's ferritin level today due to her anemia and she became more fatigued and weak.  We provided water and crackers and checked a CBG which came out at 64.  After the water and the crackers the patient began to feel better.  We asked if she would like to go to the MAU and she reported not at this time.  We gave her strict ED precautions. -Prescription sent for Diclegis to patient's pharmacy -Referral placed for high risk OB -Recommended patient take 325 iron daily -Strict ED/MAU precautions given and patient is agreeable to this -Patient's initial prenatal visit scheduled until she is scheduled with high risk OB

## 2020-06-23 ENCOUNTER — Other Ambulatory Visit: Payer: Self-pay

## 2020-06-23 ENCOUNTER — Ambulatory Visit (INDEPENDENT_AMBULATORY_CARE_PROVIDER_SITE_OTHER): Payer: BC Managed Care – PPO | Admitting: Family Medicine

## 2020-06-23 DIAGNOSIS — O09 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, unspecified trimester: Secondary | ICD-10-CM

## 2020-06-23 NOTE — Progress Notes (Signed)
    SUBJECTIVE:   CHIEF COMPLAINT / HPI:   Follow-up on nausea and vomiting Patient reports that the nausea and vomiting has greatly improved since starting Diclegis.  She takes 2 at night and 1 in the morning.  Has been able to keep food down and feels like she has gained weight.  Denies any vaginal bleeding, loss of fluid, contractions.  She has questions regarding her next prenatal visit and we called and scheduled initial prenatal visit with high risk OB it for me now.  We also checked to make sure the request of records from the fertility clinic had been sent and they were sent on 3/10.  We have yet to receive any response from them so have no ultrasound dating or idea on cervical length.  Request of records recent today and we will scan them into the chart as soon as we receive them.  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  She reports her p.o. intake is also increased.  Weight has increased by 3 pounds since previous visit.  She currently has plenty of Diclegis to last her until she establishes care with high risk OB.  We contacted [facility] and scheduled her for her initial OB visit.  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  We will upload those when we receive them.  Strict MAU precautions discussed with the patient and she  will follow up with OB clinic for future pregnancy care.     Gifford Shave, MD [place]

## 2020-06-23 NOTE — Telephone Encounter (Signed)
Late entry, called the patient the day that this was sent and discussed her symptoms.  Also saw patient after this and sent prescription for Diclegis.  Patient is now establishing with high risk OB.

## 2020-06-23 NOTE — Patient Instructions (Signed)
It was wonderful seeing you today.  I am glad you are feeling better and the Diclegis is doing his job.  We have called in scheduling appointment with the obstetrician at [place] for women at for me now.  The address is below.  If you have any vaginal bleeding, gush of fluid, feel contractions please seek medical attention immediately.  I hope you have a wonderful afternoon!  [facility] for [employer] [facility] at [place],  [place]  [id number]

## 2020-06-23 NOTE — Assessment & Plan Note (Signed)
Patient's lethargy and nausea and vomiting have greatly improved since our previous visit.  She reports her p.o. intake is also increased.  Weight has increased by 3 pounds since previous visit.  She currently has plenty of Diclegis to last her until she establishes care with high risk OB.  We contacted [facility] and scheduled her for her initial OB visit.  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  We will upload those when we receive them.  Strict MAU precautions discussed with the patient and she will follow up with OB clinic for future pregnancy care.

## 2020-06-24 ENCOUNTER — Other Ambulatory Visit: Payer: Self-pay

## 2020-06-24 MED ORDER — DOXYLAMINE-PYRIDOXINE 10-10 MG PO TBEC: 2.0000 | DELAYED_RELEASE_TABLET | Freq: Every day | ORAL | 0 refills | Status: DC

## 2020-07-04 ENCOUNTER — Ambulatory Visit (INDEPENDENT_AMBULATORY_CARE_PROVIDER_SITE_OTHER): Payer: BC Managed Care – PPO

## 2020-07-04 ENCOUNTER — Other Ambulatory Visit: Payer: Self-pay

## 2020-07-04 VITALS — BP 122/71 | HR 80 | Ht 66.0 in | Wt 177.2 lb

## 2020-07-04 DIAGNOSIS — O0990 Supervision of high risk pregnancy, unspecified, unspecified trimester: Secondary | ICD-10-CM | POA: Insufficient documentation

## 2020-07-04 DIAGNOSIS — O3680X0 Pregnancy with inconclusive fetal viability, not applicable or unspecified: Secondary | ICD-10-CM

## 2020-07-04 DIAGNOSIS — Z3A10 10 weeks gestation of pregnancy: Secondary | ICD-10-CM | POA: Diagnosis not present

## 2020-07-04 DIAGNOSIS — Z789 Other specified health status: Secondary | ICD-10-CM

## 2020-07-04 DIAGNOSIS — Z3481 Encounter for supervision of other normal pregnancy, first trimester: Secondary | ICD-10-CM | POA: Diagnosis not present

## 2020-07-04 DIAGNOSIS — Z3491 Encounter for supervision of normal pregnancy, unspecified, first trimester: Secondary | ICD-10-CM

## 2020-07-04 NOTE — Progress Notes (Signed)
Patient was assessed and managed by nursing staff during this encounter. I have reviewed the chart and agree with the documentation and plan. I have also made any necessary editorial changes.  Verita Schneiders, MD 07/04/2020 12:17 PM

## 2020-07-04 NOTE — Progress Notes (Signed)
PRENATAL INTAKE SUMMARY  Ms. Lady Wisham presents today New OB Nurse Interview.  OB History    Gravida  2   Para  1   Term  1   Preterm      AB      Living  1     SAB      IAB      Ectopic      Multiple      Live Births  1          I have reviewed the patient's medical, obstetrical, social, and family histories, medications, and available lab results.  SUBJECTIVE She states that she notice some slight bright red spotting yesterday on her panty liner and when she wiped and again this am when wiping. She denies any cramping.  OBJECTIVE Initial Physical Exam (New OB)  GENERAL APPEARANCE: alert, well appearing   ASSESSMENT Normal pregnancy  PLAN Prenatal care to be completed at [facility] labs to be completed at [facility] Provider visit  Baby Scripts Ordered Patient will get a BP cuff U/S performed today reveals single live IUP at [redacted]w[redacted]d by [place]. FHR 158 PHQ2 score: 0 GAD 7 score: 0

## 2020-07-09 ENCOUNTER — Ambulatory Visit (INDEPENDENT_AMBULATORY_CARE_PROVIDER_SITE_OTHER): Payer: BC Managed Care – PPO | Admitting: Obstetrics and Gynecology

## 2020-07-09 ENCOUNTER — Encounter: Payer: Self-pay | Admitting: Obstetrics and Gynecology

## 2020-07-09 ENCOUNTER — Encounter: Payer: BC Managed Care – PPO | Admitting: Obstetrics and Gynecology

## 2020-07-09 ENCOUNTER — Other Ambulatory Visit (HOSPITAL_COMMUNITY)
Admission: RE | Admit: 2020-07-09 | Discharge: 2020-07-09 | Disposition: A | Discharge status: Home / Self Care | Payer: BC Managed Care – PPO | Source: Ambulatory Visit | Attending: Obstetrics and Gynecology | Admitting: Obstetrics and Gynecology

## 2020-07-09 ENCOUNTER — Other Ambulatory Visit: Payer: Self-pay

## 2020-07-09 VITALS — BP 114/68 | HR 79 | Wt 173.0 lb

## 2020-07-09 DIAGNOSIS — B9689 Other specified bacterial agents as the cause of diseases classified elsewhere: Secondary | ICD-10-CM | POA: Diagnosis not present

## 2020-07-09 DIAGNOSIS — O09291 Supervision of pregnancy with other poor reproductive or obstetric history, first trimester: Secondary | ICD-10-CM | POA: Diagnosis present

## 2020-07-09 DIAGNOSIS — O23591 Infection of other part of genital tract in pregnancy, first trimester: Secondary | ICD-10-CM | POA: Diagnosis not present

## 2020-07-09 DIAGNOSIS — O09 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, unspecified trimester: Secondary | ICD-10-CM | POA: Insufficient documentation

## 2020-07-09 DIAGNOSIS — D219 Benign neoplasm of connective and other soft tissue, unspecified: Secondary | ICD-10-CM

## 2020-07-09 DIAGNOSIS — Z3A11 11 weeks gestation of pregnancy: Secondary | ICD-10-CM | POA: Diagnosis not present

## 2020-07-09 DIAGNOSIS — O0901 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, first trimester: Secondary | ICD-10-CM | POA: Diagnosis not present

## 2020-07-09 DIAGNOSIS — O09299 Supervision of pregnancy with other poor reproductive or obstetric history, unspecified trimester: Secondary | ICD-10-CM

## 2020-07-09 DIAGNOSIS — Z141 Cystic fibrosis carrier: Secondary | ICD-10-CM

## 2020-07-09 DIAGNOSIS — O219 Vomiting of pregnancy, unspecified: Secondary | ICD-10-CM | POA: Insufficient documentation

## 2020-07-09 DIAGNOSIS — O09529 Supervision of elderly multigravida, unspecified trimester: Secondary | ICD-10-CM

## 2020-07-09 DIAGNOSIS — O09521 Supervision of elderly multigravida, first trimester: Secondary | ICD-10-CM

## 2020-07-09 HISTORY — DX: Supervision of elderly multigravida, unspecified trimester: O09.529

## 2020-07-09 HISTORY — DX: Supervision of pregnancy with other poor reproductive or obstetric history, unspecified trimester: O09.299

## 2020-07-09 MED ORDER — ASPIRIN EC 81 MG PO TBEC: 81.0000 mg | DELAYED_RELEASE_TABLET | Freq: Every day | ORAL | 2 refills | Status: DC

## 2020-07-09 NOTE — Progress Notes (Signed)
Pt has severe fatigue, weight loss due to Nausea.  Pt doesn't have appetite. Pt has had some light spotting with wiping- scant amount.

## 2020-07-09 NOTE — Progress Notes (Signed)
Subjective:  [name] is a 35 y.o. G2P1001 at 21w1dbeing seen today for ongoing prenatal care.  [place] H/O c section with last pregnancy for NRFHT's. H/O infertility. Laparoscopic myomectomy for uterine fibroids ( 3/21), H/O shorten cervix with last pregnancy ( treated with vaginal progesterone).  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  Patient reports nausea.  Contractions: Not present. Vag. Bleeding: None.   . Denies leaking of fluid.   The following portions of the patient's history were reviewed and updated as appropriate: allergies, current medications, past family history, past medical history, past social history, past surgical history and problem list. Problem list updated.  Objective:   Vitals:   07/09/20 1511  BP: 114/68  Pulse: 79  Weight: 173 lb (78.5 kg)    Fetal Status: Fetal Heart Rate (bpm): 160         General:  Alert, oriented and cooperative. Patient is in no acute distress.  Skin: Skin is warm and dry. No rash noted.   Cardiovascular: Normal heart rate noted  Respiratory: Normal respiratory effort, no problems with respiration noted  Abdomen: Soft, gravid, appropriate for gestational age. Pain/Pressure: Absent     Pelvic:  Cervical exam performed        Extremities: Normal range of motion.     Mental Status: Normal mood and affect. Normal behavior. Normal judgment and thought content.   Urinalysis:      Assessment and Plan:  Pregnancy: G2P1001 at 169w1d1. 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 Fibroids S/P Lap Myomectomy 3/21 by REI  4. History of prior pregnancy with short cervix, currently pregnant CL ordered  5. Multigravida of advanced maternal age in first trimester Genetic testing  6. Nausea and vomiting in pregnancy Improving with current treatment Diet reviewed with pt  Preterm labor symptoms and general obstetric precautions including but not limited to vaginal bleeding, contractions, leaking of fluid and fetal movement were reviewed in detail with the patient. Please refer to After Visit Summary for other counseling recommendations.  Return in about 4 weeks (around 08/06/2020) for OB visit, face to face, MD only.   ErChancy MilroyMD

## 2020-07-09 NOTE — Patient Instructions (Signed)
Obstetrics: Normal and Problem Pregnancies (7th ed., pp. 102-121). [place], [place]: Elsevier."> Textbook of Family Medicine (9th ed., pp. [phone]). [place], [place]: Elsevier [employer].">  First Trimester of Pregnancy  The first trimester of pregnancy starts on the first day of your last menstrual period until the end of week 12. This is months 1 through 3 of pregnancy. A week after a sperm fertilizes an egg, the egg will implant into the wall of the uterus and begin to develop into a baby. By the end of 12 weeks, all the baby's organs will be formed and the baby will be 2-3 inches in size. Body changes during your first trimester Your body goes through many changes during pregnancy. The changes vary and generally return to normal after your baby is born. Physical changes  You may gain or lose weight.  Your breasts may begin to grow larger and become tender. The tissue that surrounds your nipples (areola) may become darker.  Dark spots or blotches (chloasma or mask of pregnancy) may develop on your face.  You may have changes in your hair. These can include thickening or thinning of your hair or changes in texture. Health changes  You may feel nauseous, and you may vomit.  You may have heartburn.  You may develop headaches.  You may develop constipation.  Your gums may bleed and may be sensitive to brushing and flossing. Other changes  You may tire easily.  You may urinate more often.  Your menstrual periods will stop.  You may have a loss of appetite.  You may develop cravings for certain kinds of food.  You may have changes in your emotions from day to day.  You may have more vivid and strange dreams. Follow these instructions at home: Medicines  Follow your health care provider's instructions regarding medicine use. Specific medicines may be either safe or unsafe to take during pregnancy. Do not take any medicines unless told to by your health care provider.  Take a  prenatal vitamin that contains at least 600 micrograms (mcg) of folic acid. Eating and drinking  Eat a healthy diet that includes fresh fruits and vegetables, whole grains, good sources of protein such as meat, eggs, or tofu, and low-fat dairy products.  Avoid raw meat and unpasteurized juice, milk, and cheese. These carry germs that can harm you and your baby.  If you feel nauseous or you vomit: ? Eat 4 or 5 small meals a day instead of 3 large meals. ? Try eating a few soda crackers. ? Drink liquids between meals instead of during meals.  You may need to take these actions to prevent or treat constipation: ? Drink enough fluid to keep your urine pale yellow. ? Eat foods that are high in fiber, such as beans, whole grains, and fresh fruits and vegetables. ? Limit foods that are high in fat and processed sugars, such as fried or sweet foods. Activity  Exercise only as directed by your health care provider. Most people can continue their usual exercise routine during pregnancy. Try to exercise for 30 minutes at least 5 days a week.  Stop exercising if you develop pain or cramping in the lower abdomen or lower back.  Avoid exercising if it is very hot or humid or if you are at high altitude.  Avoid heavy lifting.  If you choose to, you may have sex unless your health care provider tells you not to. Relieving pain and discomfort  Wear a good support bra to relieve breast  tenderness.  Rest with your legs elevated if you have leg cramps or low back pain.  If you develop bulging veins (varicose veins) in your legs: ? Wear support hose as told by your health care provider. ? Elevate your feet for 15 minutes, 3-4 times a day. ? Limit salt in your diet. Safety  Wear your seat belt at all times when driving or riding in a car.  Talk with your health care provider if someone is verbally or physically abusive to you.  Talk with your health care provider if you are feeling sad or have  thoughts of hurting yourself. Lifestyle  Do not use hot tubs, steam rooms, or saunas.  Do not douche. Do not use tampons or scented sanitary pads.  Do not use herbal remedies, alcohol, illegal drugs, or medicines that are not approved by your health care provider. Chemicals in these products can harm your baby.  Do not use any products that contain nicotine or tobacco, such as cigarettes, e-cigarettes, and chewing tobacco. If you need help quitting, ask your health care provider.  Avoid cat litter boxes and soil used by cats. These carry germs that can cause birth defects in the baby and possibly loss of the unborn baby (fetus) by miscarriage or stillbirth. General instructions  During routine prenatal visits in the first trimester, your health care provider will do a physical exam, perform necessary tests, and ask you how things are going. Keep all follow-up visits. This is important.  Ask for help if you have counseling or nutritional needs during pregnancy. Your health care provider can offer advice or refer you to specialists for help with various needs.  Schedule a dentist appointment. At home, brush your teeth with a soft toothbrush. Floss gently.  Write down your questions. Take them to your prenatal visits. Where to find more information  American Pregnancy Association: americanpregnancy.[place] and Gynecologists: PoolDevices.com.pt  Office on [employer]: KeywordPortfolios.com.br Contact a health care provider if you have:  Dizziness.  A fever.  Mild pelvic cramps, pelvic pressure, or nagging pain in the abdominal area.  Nausea, vomiting, or diarrhea that lasts for 24 hours or longer.  A bad-smelling vaginal discharge.  Pain when you urinate.  Known exposure to a contagious illness, such as chickenpox, measles, Zika virus, HIV, or hepatitis. Get help right away if you have:  Spotting or bleeding from your  vagina.  Severe abdominal cramping or pain.  Shortness of breath or chest pain.  Any kind of trauma, such as from a fall or a car crash.  New or increased pain, swelling, or redness in an arm or leg. Summary  The first trimester of pregnancy starts on the first day of your last menstrual period until the end of week 12 (months 1 through 3).  Eating 4 or 5 small meals a day rather than 3 large meals may help to relieve nausea and vomiting.  Do not use any products that contain nicotine or tobacco, such as cigarettes, e-cigarettes, and chewing tobacco. If you need help quitting, ask your health care provider.  Keep all follow-up visits. This is important. This information is not intended to replace advice given to you by your health care provider. Make sure you discuss any questions you have with your health care provider. Document Revised: 08/29/2019 Document Reviewed: 07/05/2019 Elsevier Patient Education  2021 [employer].

## 2020-07-10 LAB — CERVICOVAGINAL ANCILLARY ONLY
Bacterial Vaginitis (gardnerella): POSITIVE — AB
Candida Glabrata: NEGATIVE
Candida Vaginitis: NEGATIVE
Chlamydia: NEGATIVE
Comment: NEGATIVE
Comment: NEGATIVE
Comment: NEGATIVE
Comment: NEGATIVE
Comment: NEGATIVE
Comment: NORMAL
Neisseria Gonorrhea: NEGATIVE
Trichomonas: NEGATIVE

## 2020-07-10 LAB — PROTEIN / CREATININE RATIO, URINE
Creatinine, Urine: 204.2 mg/dL
Protein, Ur: 10.5 mg/dL
Protein/Creat Ratio: 51 mg/g creat (ref 0–200)

## 2020-07-10 LAB — COMPREHENSIVE METABOLIC PANEL
ALT: 26 IU/L (ref 0–32)
AST: 27 IU/L (ref 0–40)
Albumin/Globulin Ratio: 1.6 (ref 1.2–2.2)
Albumin: 4.2 g/dL (ref 3.8–4.8)
Alkaline Phosphatase: 44 IU/L (ref 44–121)
BUN/Creatinine Ratio: 11 (ref 9–23)
BUN: 7 mg/dL (ref 6–20)
Bilirubin Total: 0.4 mg/dL (ref 0.0–1.2)
CO2: 17 mmol/L — ABNORMAL LOW (ref 20–29)
Calcium: 9.3 mg/dL (ref 8.7–10.2)
Chloride: 103 mmol/L (ref 96–106)
Creatinine, Ser: 0.62 mg/dL (ref 0.57–1.00)
Globulin, Total: 2.7 g/dL (ref 1.5–4.5)
Glucose: 60 mg/dL — ABNORMAL LOW (ref 65–99)
Potassium: 4.2 mmol/L (ref 3.5–5.2)
Sodium: 134 mmol/L (ref 134–144)
Total Protein: 6.9 g/dL (ref 6.0–8.5)
eGFR: 118 mL/min/{1.73_m2} (ref >59)

## 2020-07-10 LAB — HEMOGLOBIN A1C
Est. average glucose Bld gHb Est-mCnc: 108 mg/dL
Hgb A1c MFr Bld: 5.4 % (ref 4.8–5.6)

## 2020-07-10 LAB — TSH: TSH: 0.03 u[IU]/mL — ABNORMAL LOW (ref 0.450–4.500)

## 2020-07-10 MED ORDER — METRONIDAZOLE 500 MG PO TABS: 500.0000 mg | ORAL_TABLET | Freq: Two times a day (BID) | ORAL | 0 refills | Status: DC

## 2020-07-10 NOTE — Addendum Note (Signed)
Addended by: Phillip Heal, Pinki Rottman A on: 07/10/2020 03:29 PM   Modules accepted: Orders

## 2020-07-11 ENCOUNTER — Encounter: Payer: BC Managed Care – PPO | Admitting: Family Medicine

## 2020-07-11 LAB — CYTOLOGY - PAP
Comment: NEGATIVE
Diagnosis: NEGATIVE
High risk HPV: NEGATIVE

## 2020-07-14 ENCOUNTER — Other Ambulatory Visit: Payer: Self-pay | Admitting: Obstetrics and Gynecology

## 2020-07-14 DIAGNOSIS — O09 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, unspecified trimester: Secondary | ICD-10-CM

## 2020-07-15 ENCOUNTER — Other Ambulatory Visit: Payer: Self-pay | Admitting: Obstetrics and Gynecology

## 2020-07-15 ENCOUNTER — Telehealth: Payer: Self-pay

## 2020-07-15 DIAGNOSIS — E0590 Thyrotoxicosis, unspecified without thyrotoxic crisis or storm: Secondary | ICD-10-CM

## 2020-07-15 LAB — T3: T3, Total: 200 ng/dL — ABNORMAL HIGH (ref 71–180)

## 2020-07-15 LAB — SPECIMEN STATUS REPORT

## 2020-07-15 LAB — T4: T4, Total: 15.1 ug/dL — ABNORMAL HIGH (ref 4.5–12.0)

## 2020-07-15 NOTE — Telephone Encounter (Signed)
Referral place for patient to go to endocrinology

## 2020-07-16 ENCOUNTER — Encounter: Payer: Self-pay | Admitting: Obstetrics and Gynecology

## 2020-07-21 ENCOUNTER — Ambulatory Visit (INDEPENDENT_AMBULATORY_CARE_PROVIDER_SITE_OTHER): Payer: BC Managed Care – PPO | Admitting: Internal Medicine

## 2020-07-21 ENCOUNTER — Encounter: Payer: Self-pay | Admitting: Internal Medicine

## 2020-07-21 ENCOUNTER — Other Ambulatory Visit: Payer: Self-pay

## 2020-07-21 VITALS — BP 122/72 | HR 97 | Ht 66.0 in | Wt 166.4 lb

## 2020-07-21 DIAGNOSIS — E0590 Thyrotoxicosis, unspecified without thyrotoxic crisis or storm: Secondary | ICD-10-CM | POA: Insufficient documentation

## 2020-07-21 DIAGNOSIS — O99280 Endocrine, nutritional and metabolic diseases complicating pregnancy, unspecified trimester: Secondary | ICD-10-CM | POA: Diagnosis not present

## 2020-07-21 DIAGNOSIS — D509 Iron deficiency anemia, unspecified: Secondary | ICD-10-CM | POA: Diagnosis not present

## 2020-07-21 LAB — TSH: TSH: 0.01 u[IU]/mL — ABNORMAL LOW (ref 0.35–4.50)

## 2020-07-21 NOTE — Progress Notes (Signed)
Name: [name]  MRN/ DOB: [id number], [birth year]    Age/ Sex: 35 y.o., female    PCP: Ezequiel Essex, MD   Reason for Endocrinology Evaluation:      Date of Initial Endocrinology Evaluation: 07/21/2020     HPI: [name] is a 35 y.o. 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 The patient presented for initial endocrinology clinic visit on 07/21/2020 for consultative assistance with her Hyperthyroidism.   She was noted to have hyperthyroidism during routine labs on 07/09/2020 with a  TSH of 0.03 uIU/mL and elevated T3 and T4 at 200 ng/dl and 15.1 ug/dL respectively.   She is currently at 12.6 weeks of gestation , this is 2nd pregnancy   She has been having decreased appetite due to nausea , had a few vomiting episodes due to Flagyl  And was noted with weight loss Denies diarrhea  Denies palpitations She is SOB when takes a hot showers, sweats at night  Denies local neck symptoms  Denies tremors    Denies FH of thyroid disease   She is an LPN    HISTORY:  Past Medical History:  Past Medical History:  Diagnosis Date  . Anemia   . Fibroid   . Fibroids    Past Surgical History:  Past Surgical History:  Procedure Laterality Date  . CESAREAN SECTION    . LAPAROSCOPIC GELPORT ASSISTED MYOMECTOMY N/A 07/04/2019   Procedure: LAPAROSCOPIC GELPORT ASSISTED MYOMECTOMY ABLATION OF ENDOMETRIOSIS  TRANSMYOMETRIAL HYSTEROSCOPY LYSIS OF ADHESIONS AND CHROMOTUBATION;  Surgeon: Governor Specking, MD;  Location: [facility];  Service: Gynecology;  Laterality: N/A;  [name] confirmed on 06/18/19 CS      Social History:  reports that she has never smoked. She has never used smokeless tobacco. She reports current alcohol use. She reports that she does not use drugs.  Family History: family history is not on file.   HOME MEDICATIONS: Allergies as of 07/21/2020   No Known Allergies     Medication List       Accurate as of July 21, 2020  1:31 PM. If you have any questions, ask your nurse or doctor.        acetaminophen 500 MG tablet Commonly known as: TYLENOL Take 500 mg by mouth every 6 (six) hours as needed for moderate pain.   aspirin EC 81 MG tablet Take 1 tablet (81 mg total) by mouth daily. Take after 12 weeks for prevention of preeclampsia later in pregnancy   Doxylamine-Pyridoxine 10-10 MG Tbec Commonly known as: Diclegis Take 2-4 tablets by mouth daily. Start with 2 tablets daily at night.  If the symptoms persist after 2 days increase to 1 tablet in the morning and 2 tablets at night.  You may further increase to 1 tablet in the morning, 1 tablet at lunch, and 2 tablets at night.  Max dose of 4 tablets/day.   ferrous sulfate 325 (65 FE) MG EC tablet Take 1 tablet (325 mg total) by mouth daily.   metroNIDAZOLE 500 MG tablet Commonly known as: FLAGYL Take 1 tablet (500 mg total) by mouth 2 (two) times daily.   prenatal vitamin w/FE, FA 27-1 MG Tabs tablet Take 1 tablet by mouth daily at 12 noon.         REVIEW OF SYSTEMS: A comprehensive ROS was conducted with the patient and is negative except as per HPI    OBJECTIVE:  VS: BP 122/72   Pulse 97  Ht _0  (1.676 m)   Wt 166 lb 6 oz (75.5 kg)   LMP 04/29/2020 (Exact Date)   SpO2 98%   BMI 26.85 kg/m    Wt Readings from Last 3 Encounters:  07/21/20 166 lb 6 oz (75.5 kg)  07/09/20 173 lb (78.5 kg)  07/04/20 177 lb 3.2 oz (80.4 kg)     EXAM: General: Pt appears well and is in NAD  Eyes: External eye exam normal without stare, lid lag or exophthalmos.  EOM intact.    Neck: General: Supple without adenopathy. Thyroid: Thyroid size normal.  No goiter or nodules appreciated. No thyroid bruit.  Lungs: Clear with good BS bilat with no rales, rhonchi, or wheezes  Heart: Auscultation: RRR.  Abdomen: Normoactive bowel sounds, soft, nontender, without masses or organomegaly palpable  Extremities:  BL LE: No pretibial edema normal ROM and  strength.  Skin: Hair: Texture and amount normal with gender appropriate distribution Skin Inspection: No rashes Skin Palpation: Skin temperature, texture, and thickness normal to palpation  Mental Status: Judgment, insight: Intact Orientation: Oriented to time, place, and person Memory: Intact for recent and remote events Mood and affect: No depression, anxiety, or agitation     DATA REVIEWED:   Results for Kathryn, Robles (MRN [id number]) as of 07/22/2020 11:21  Ref. Range 07/09/2020 16:09 07/21/2020 13:59  TSH Latest Ref Range: 0.35 - 4.50 uIU/mL 0.030 (L) 0.01 (L)  Triiodothyronine (T3) Latest Ref Range: 76 - 181 ng/dL 200 (H) 188 (H)  Thyroxine (T4) Latest Ref Range: 5.1 - 11.9 mcg/dL 15.1 (H) 17.9 (H)   Results for [name], [name] (MRN [id number]) as of 07/22/2020 11:21  Ref. Range 07/09/2020 16:09  Sodium Latest Ref Range: 134 - 144 mmol/L 134  Potassium Latest Ref Range: 3.5 - 5.2 mmol/L 4.2  Chloride Latest Ref Range: 96 - 106 mmol/L 103  CO2 Latest Ref Range: 20 - 29 mmol/L 17 (L)  Glucose Latest Ref Range: 65 - 99 mg/dL 60 (L)  BUN Latest Ref Range: 6 - 20 mg/dL 7  Creatinine Latest Ref Range: 0.57 - 1.00 mg/dL 0.62  Calcium Latest Ref Range: 8.7 - 10.2 mg/dL 9.3  BUN/Creatinine Ratio Latest Ref Range: 9 - 23  11  EGFR Latest Ref Range: >59 mL/min/1.73 118  Alkaline Phosphatase Latest Ref Range: 44 - 121 IU/L 44  Albumin Latest Ref Range: 3.8 - 4.8 g/dL 4.2  Albumin/Globulin Ratio Latest Ref Range: 1.2 - 2.2  1.6  AST Latest Ref Range: 0 - 40 IU/L 27  ALT Latest Ref Range: 0 - 32 IU/L 26  Total Protein Latest Ref Range: 6.0 - 8.5 g/dL 6.9  Total Bilirubin Latest Ref Range: 0.0 - 1.2 mg/dL 0.4  Results for [name], [name] (MRN [id number]) as of 07/22/2020 11:21  Ref. Range 06/12/2020 10:30  WBC Latest Ref Range: 3.4 - 10.8 x10E3/uL 5.4  RBC Latest Ref Range: 3.77 - 5.28 x10E6/uL 3.84  Hemoglobin Latest Ref Range: 11.1 - 15.9 g/dL 8.8 (L)  HCT Latest Ref  Range: 34.0 - 46.6 % 28.4 (L)  MCV Latest Ref Range: 79 - 97 fL 74 (L)  MCH Latest Ref Range: 26.6 - 33.0 pg 22.9 (L)  MCHC Latest Ref Range: 31.5 - 35.7 g/dL 31.0 (L)  RDW Latest Ref Range: 11.7 - 15.4 % 14.6  Platelets Latest Ref Range: 150 - 450 x10E3/uL 238   ASSESSMENT/PLAN/RECOMMENDATIONS:   1. Hyperthyroidism:  - We discussed D/D of pregnancy related hyperthyroidism vs graves' disease vs toxic thyroid nodule(s) -  She is clinically euthyroid  - No local neck symptoms  -The goal of treatment is to maintain persistent but mild hyperthyroidism in the mother in an attempt to prevent fetal hypothyroidism since the fetal thyroid is more sensitive to the action of thionamide therapy. Overtreatment of maternal hyperthyroidism can cause fetal goiter and primary hypothyroidism.  - Repeat TFT's show worsening TSh and T4 levels, I have recommended PTU since she is still in the first trimester - We discussed with pt the benefits of thionamide therapy  in the Tx of hyperthyroidism, as well as the possible side effects/complications of anti-thyroid drug Tx (specifically detailing the rare, but serious side effect of agranulocytosis). She  was informed of need for regular thyroid function monitoring while on thionamides to ensure appropriate dosage without over-treatment. As well, we discussed the possible but rare  side effects  including the small chance of liver irritation/juandice and the  chance of sudden onset agranulocytosis.  We discussed importance of going to ED promptly (and stopping thionamide therapy) if she were to develop significant fever.   Medications : PTU 50 mg daily     2. Microcystic Anemia :   - This is a contributing factor to fatigue, she has not been taking iron supplements regularly, we discussed the importance of proper iron intake, I have advised her to use fiber supplements to prevent constipation      Labs in 4 weeks  F/U in 2 months    Addendum: discussed lab  with the pt on 07/22/2020 at 11:20 MA   Signed electronically by: Mack Guise, MD  [facility] Endocrinology  [facility] [address] [place],  [address] Phone: [phone] FAX: [phone]   CC: Ezequiel Essex, [place] [address] Phone: [phone] Fax: [phone]   Return to Endocrinology clinic as below: Future Appointments  Date Time Provider [place]  07/25/2020 11:15 AM [facility] NURSE [facility]  07/25/2020 11:30 AM [facility] US3 [facility]  08/06/2020  9:45 AM Constant, Peggy, MD [place] None  09/03/2020  2:00 PM [facility] NURSE [facility]  09/03/2020  2:15 PM [facility] US2 [facility]

## 2020-07-22 DIAGNOSIS — D509 Iron deficiency anemia, unspecified: Secondary | ICD-10-CM | POA: Insufficient documentation

## 2020-07-22 MED ORDER — PROPYLTHIOURACIL 50 MG PO TABS: 50.0000 mg | ORAL_TABLET | Freq: Every day | ORAL | 1 refills | Status: DC

## 2020-07-23 ENCOUNTER — Other Ambulatory Visit: Payer: Self-pay | Admitting: Internal Medicine

## 2020-07-23 MED ORDER — PROPYLTHIOURACIL 50 MG PO TABS: 50.0000 mg | ORAL_TABLET | Freq: Every day | ORAL | 0 refills | Status: DC

## 2020-07-23 NOTE — Telephone Encounter (Signed)
Pharmacy request 90 days supply

## 2020-07-24 LAB — T4: T4, Total: 17.9 ug/dL — ABNORMAL HIGH (ref 5.1–11.9)

## 2020-07-24 LAB — TRAB (TSH RECEPTOR BINDING ANTIBODY): TRAB: <1 IU/L (ref <=2.00)

## 2020-07-24 LAB — T3: T3, Total: 188 ng/dL — ABNORMAL HIGH (ref 76–181)

## 2020-07-25 ENCOUNTER — Ambulatory Visit: Payer: BC Managed Care – PPO | Attending: Obstetrics and Gynecology

## 2020-07-25 ENCOUNTER — Ambulatory Visit: Payer: BC Managed Care – PPO | Admitting: *Deleted

## 2020-07-25 ENCOUNTER — Other Ambulatory Visit: Payer: Self-pay | Admitting: *Deleted

## 2020-07-25 ENCOUNTER — Other Ambulatory Visit: Payer: Self-pay

## 2020-07-25 ENCOUNTER — Encounter: Payer: Self-pay | Admitting: *Deleted

## 2020-07-25 DIAGNOSIS — Z3686 Encounter for antenatal screening for cervical length: Secondary | ICD-10-CM

## 2020-07-25 DIAGNOSIS — O09 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, unspecified trimester: Secondary | ICD-10-CM | POA: Diagnosis not present

## 2020-07-25 DIAGNOSIS — O0990 Supervision of high risk pregnancy, unspecified, unspecified trimester: Secondary | ICD-10-CM

## 2020-08-06 ENCOUNTER — Encounter: Payer: Self-pay | Admitting: Obstetrics and Gynecology

## 2020-08-06 ENCOUNTER — Other Ambulatory Visit: Payer: Self-pay

## 2020-08-06 ENCOUNTER — Ambulatory Visit (INDEPENDENT_AMBULATORY_CARE_PROVIDER_SITE_OTHER): Payer: BC Managed Care – PPO | Admitting: Obstetrics and Gynecology

## 2020-08-06 VITALS — BP 121/67 | HR 98 | Wt 170.0 lb

## 2020-08-06 DIAGNOSIS — O0990 Supervision of high risk pregnancy, unspecified, unspecified trimester: Secondary | ICD-10-CM

## 2020-08-06 DIAGNOSIS — O34219 Maternal care for unspecified type scar from previous cesarean delivery: Secondary | ICD-10-CM | POA: Insufficient documentation

## 2020-08-06 DIAGNOSIS — O09522 Supervision of elderly multigravida, second trimester: Secondary | ICD-10-CM

## 2020-08-06 DIAGNOSIS — E0590 Thyrotoxicosis, unspecified without thyrotoxic crisis or storm: Secondary | ICD-10-CM

## 2020-08-06 DIAGNOSIS — O99280 Endocrine, nutritional and metabolic diseases complicating pregnancy, unspecified trimester: Secondary | ICD-10-CM

## 2020-08-06 NOTE — Progress Notes (Signed)
   PRENATAL VISIT NOTE  Subjective:  [name] is a 35 y.o. G2P1001 at [redacted]w[redacted]d being seen today for ongoing prenatal care.  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  Patient reports no complaints.  Contractions: Not present. Vag. Bleeding: None.   . Denies leaking of fluid.   The following portions of the patient's history were reviewed and updated as appropriate: allergies, current medications, past family history, past medical history, past social history, past surgical history and problem list.   Objective:   Vitals:   08/06/20 0942  BP: 121/67  Pulse: 98  Weight: 170 lb (77.1 kg)    Fetal Status: Fetal Heart Rate (bpm): 151         General:  Alert, oriented and cooperative. Patient is in no acute distress.  Skin: Skin is warm and dry. No rash noted.   Cardiovascular: Normal heart rate noted  Respiratory: Normal respiratory effort, no problems with respiration noted  Abdomen: Soft, gravid, appropriate for gestational age.  Pain/Pressure: Absent     Pelvic: Cervical exam deferred        Extremities: Normal range of motion.  Edema: None  Mental Status: Normal mood and affect. Normal behavior. Normal judgment and thought content.   Assessment and Plan:  Pregnancy: G2P1001 at [redacted]w[redacted]d 1. Supervision of high risk pregnancy, antepartum Patient is doing well without complaints Anatomy ultrasound 6/1 AFP today  2. Multigravida of advanced maternal age in second trimester Continue  ASA  3. Hyperthyroidism during pregnancy Recently started on PTU Follow up appointment with endocrinologist in June  4. Previous cesarean delivery affecting pregnancy, antepartum Will need a repeat c-section per REI- will schedule at 37 weeks  Preterm labor symptoms and general obstetric precautions including but not limited to vaginal bleeding, contractions, leaking of fluid and fetal movement were reviewed in detail with the patient. Please refer to After Visit Summary for other counseling recommendations.   Return in about 4 weeks (around 09/03/2020) for in person, ROB, High risk.  Future Appointments  Date Time Provider [place]  08/18/2020 10:30 AM [facility] None  09/03/2020  2:00 PM [facility] NURSE [facility]  09/03/2020  2:15 PM [facility] US2 [facility]  09/17/2020 10:50 AM Shamleffer, Melanie Crazier, MD [facility] None    Mora Bellman, MD

## 2020-08-06 NOTE — Progress Notes (Signed)
Pt reports that she may be feeling flutter movements but is unsure, denies pain.

## 2020-08-08 LAB — AFP, SERUM, OPEN SPINA BIFIDA
AFP MoM: 2.31
AFP Value: 72.4 ng/mL
Gest. Age on Collection Date: 15.1 weeks
Maternal Age At EDD: 37 yr
OSBR Risk 1 IN: 835
Test Results:: NEGATIVE
Weight: 170 [lb_av]

## 2020-08-18 ENCOUNTER — Other Ambulatory Visit (INDEPENDENT_AMBULATORY_CARE_PROVIDER_SITE_OTHER): Payer: BC Managed Care – PPO

## 2020-08-18 ENCOUNTER — Other Ambulatory Visit: Payer: Self-pay | Admitting: Internal Medicine

## 2020-08-18 ENCOUNTER — Other Ambulatory Visit: Payer: Self-pay

## 2020-08-18 DIAGNOSIS — E0590 Thyrotoxicosis, unspecified without thyrotoxic crisis or storm: Secondary | ICD-10-CM | POA: Diagnosis not present

## 2020-08-18 DIAGNOSIS — O99280 Endocrine, nutritional and metabolic diseases complicating pregnancy, unspecified trimester: Secondary | ICD-10-CM | POA: Diagnosis not present

## 2020-08-18 LAB — TSH: TSH: 0.44 u[IU]/mL (ref 0.35–4.50)

## 2020-08-19 LAB — T3: T3, Total: 155 ng/dL (ref 76–181)

## 2020-08-19 LAB — T4: T4, Total: 10.9 ug/dL (ref 5.1–11.9)

## 2020-08-20 ENCOUNTER — Other Ambulatory Visit: Payer: Self-pay

## 2020-08-20 DIAGNOSIS — O98819 Other maternal infectious and parasitic diseases complicating pregnancy, unspecified trimester: Secondary | ICD-10-CM

## 2020-08-20 DIAGNOSIS — B373 Candidiasis of vulva and vagina: Secondary | ICD-10-CM

## 2020-08-20 MED ORDER — TERCONAZOLE 0.8 % VA CREA: 1.0000 | TOPICAL_CREAM | Freq: Every day | VAGINAL | 0 refills | Status: DC

## 2020-08-20 NOTE — Progress Notes (Signed)
Pt reports vulva itching and thick vaginal discharge Denies vaginal odor  Terconazole QHS x 3 days sent to [facility]  Pt to contact the office if sx's worsen or do not improve after treatement

## 2020-09-03 ENCOUNTER — Encounter: Payer: Self-pay | Admitting: Family Medicine

## 2020-09-03 ENCOUNTER — Encounter: Payer: Self-pay | Admitting: *Deleted

## 2020-09-03 ENCOUNTER — Ambulatory Visit: Payer: BC Managed Care – PPO | Attending: Obstetrics and Gynecology

## 2020-09-03 ENCOUNTER — Ambulatory Visit: Payer: BC Managed Care – PPO | Admitting: *Deleted

## 2020-09-03 ENCOUNTER — Other Ambulatory Visit: Payer: Self-pay

## 2020-09-03 ENCOUNTER — Ambulatory Visit (INDEPENDENT_AMBULATORY_CARE_PROVIDER_SITE_OTHER): Payer: BC Managed Care – PPO | Admitting: Family Medicine

## 2020-09-03 VITALS — BP 111/69 | HR 98 | Wt 174.9 lb

## 2020-09-03 DIAGNOSIS — O0990 Supervision of high risk pregnancy, unspecified, unspecified trimester: Secondary | ICD-10-CM

## 2020-09-03 DIAGNOSIS — O09 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, unspecified trimester: Secondary | ICD-10-CM | POA: Insufficient documentation

## 2020-09-03 DIAGNOSIS — O09522 Supervision of elderly multigravida, second trimester: Secondary | ICD-10-CM

## 2020-09-03 DIAGNOSIS — O34219 Maternal care for unspecified type scar from previous cesarean delivery: Secondary | ICD-10-CM

## 2020-09-03 DIAGNOSIS — O09299 Supervision of pregnancy with other poor reproductive or obstetric history, unspecified trimester: Secondary | ICD-10-CM

## 2020-09-03 DIAGNOSIS — D509 Iron deficiency anemia, unspecified: Secondary | ICD-10-CM

## 2020-09-03 DIAGNOSIS — O99280 Endocrine, nutritional and metabolic diseases complicating pregnancy, unspecified trimester: Secondary | ICD-10-CM

## 2020-09-03 DIAGNOSIS — Z3686 Encounter for antenatal screening for cervical length: Secondary | ICD-10-CM | POA: Diagnosis not present

## 2020-09-03 DIAGNOSIS — E0590 Thyrotoxicosis, unspecified without thyrotoxic crisis or storm: Secondary | ICD-10-CM

## 2020-09-03 NOTE — Progress Notes (Signed)
Concerned about weight gain.

## 2020-09-03 NOTE — Progress Notes (Signed)
   Subjective:  [name] is a 35 y.o. G2P1001 at [redacted]w[redacted]d being seen today for ongoing prenatal care.  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  Patient reports no complaints.  Contractions: Not present. Vag. Bleeding: None.  Movement: Present. Denies leaking of fluid.   The following portions of the patient's history were reviewed and updated as appropriate: allergies, current medications, past family history, past medical history, past social history, past surgical history and problem list. Problem list updated.  Objective:   Vitals:   09/03/20 1057  BP: 111/69  Pulse: 98  Weight: 174 lb 14.4 oz (79.3 kg)    Fetal Status: Fetal Heart Rate (bpm): 150   Movement: Present     General:  Alert, oriented and cooperative. Patient is in no acute distress.  Skin: Skin is warm and dry. No rash noted.   Cardiovascular: Normal heart rate noted  Respiratory: Normal respiratory effort, no problems with respiration noted  Abdomen: Soft, gravid, appropriate for gestational age. Pain/Pressure: Absent     Pelvic: Vag. Bleeding: None     Cervical exam deferred        Extremities: Normal range of motion.  Edema: None  Mental Status: Normal mood and affect. Normal behavior. Normal judgment and thought content.   Urinalysis:      Assessment and Plan:  Pregnancy: G2P1001 at [redacted]w[redacted]d  1. Previous cesarean delivery affecting pregnancy, antepartum Needs repeat at 37 wks  2. Supervision of high risk  pregnancy, antepartum BP and FHR normal Normal AFP last visit Anatomy scheduled for today  3. Microcytic anemia Last hgb 8.8, reports taking iron supplement Will recheck CBC at 28 weeks to see if she needs IV iron  4. History of prior pregnancy with short cervix, currently pregnant Not taking anything currently  5. Multigravida of advanced maternal age in second trimester   6. Hyperthyroidism during pregnancy Following w Endo Has appt on 09/17/2020 Taking PTU  Preterm labor symptoms and general obstetric precautions including but not limited to vaginal bleeding, contractions, leaking of fluid and fetal movement were reviewed in detail with the patient. Please refer to After Visit Summary for other counseling recommendations.  Return in 4 weeks (on 10/01/2020) for [facility], ob visit, needs MD.   Clarnce Flock, MD

## 2020-09-03 NOTE — Patient Instructions (Signed)
Contraception Choices Contraception, also called birth control, refers to methods or devices that prevent pregnancy. Hormonal methods Contraceptive implant A contraceptive implant is a thin, plastic tube that contains a hormone that prevents pregnancy. It is different from an intrauterine device (IUD). It is inserted into the upper part of the arm by a health care provider. Implants can be effective for up to 3 years. Progestin-only injections Progestin-only injections are injections of progestin, a synthetic form of the hormone progesterone. They are given every 3 months by a health care provider. Birth control pills Birth control pills are pills that contain hormones that prevent pregnancy. They must be taken once a day, preferably at the same time each day. A prescription is needed to use this method of contraception. Birth control patch The birth control patch contains hormones that prevent pregnancy. It is placed on the skin and must be changed once a week for three weeks and removed on the fourth week. A prescription is needed to use this method of contraception. Vaginal ring A vaginal ring contains hormones that prevent pregnancy. It is placed in the vagina for three weeks and removed on the fourth week. After that, the process is repeated with a new ring. A prescription is needed to use this method of contraception. Emergency contraceptive Emergency contraceptives prevent pregnancy after unprotected sex. They come in pill form and can be taken up to 5 days after sex. They work best the sooner they are taken after having sex. Most emergency contraceptives are available without a prescription. This method should not be used as your only form of birth control.   Barrier methods Female condom A female condom is a thin sheath that is worn over the penis during sex. Condoms keep sperm from going inside a woman's body. They can be used with a sperm-killing substance (spermicide) to increase their  effectiveness. They should be thrown away after one use. Female condom A female condom is a soft, loose-fitting sheath that is put into the vagina before sex. The condom keeps sperm from going inside a woman's body. They should be thrown away after one use. Diaphragm A diaphragm is a soft, dome-shaped barrier. It is inserted into the vagina before sex, along with a spermicide. The diaphragm blocks sperm from entering the uterus, and the spermicide kills sperm. A diaphragm should be left in the vagina for 6-8 hours after sex and removed within 24 hours. A diaphragm is prescribed and fitted by a health care provider. A diaphragm should be replaced every 1-2 years, after giving birth, after gaining more than 15 lb (6.8 kg), and after pelvic surgery. Cervical cap A cervical cap is a round, soft latex or plastic cup that fits over the cervix. It is inserted into the vagina before sex, along with spermicide. It blocks sperm from entering the uterus. The cap should be left in place for 6-8 hours after sex and removed within 48 hours. A cervical cap must be prescribed and fitted by a health care provider. It should be replaced every 2 years. Sponge A sponge is a soft, circular piece of polyurethane foam with spermicide in it. The sponge helps block sperm from entering the uterus, and the spermicide kills sperm. To use it, you make it wet and then insert it into the vagina. It should be inserted before sex, left in for at least 6 hours after sex, and removed and thrown away within 30 hours. Spermicides Spermicides are chemicals that kill or block sperm from entering the  cervix and uterus. They can come as a cream, jelly, suppository, foam, or tablet. A spermicide should be inserted into the vagina with an applicator at least 35-36 minutes before sex to allow time for it to work. The process must be repeated every time you have sex. Spermicides do not require a prescription.   Intrauterine  contraception Intrauterine device (IUD) An IUD is a T-shaped device that is put in a woman's uterus. There are two types:  Hormone IUD.This type contains progestin, a synthetic form of the hormone progesterone. This type can stay in place for 3-5 years.  Copper IUD.This type is wrapped in copper wire. It can stay in place for 10 years. Permanent methods of contraception Female tubal ligation In this method, a woman's fallopian tubes are sealed, tied, or blocked during surgery to prevent eggs from traveling to the uterus. Hysteroscopic sterilization In this method, a small, flexible insert is placed into each fallopian tube. The inserts cause scar tissue to form in the fallopian tubes and block them, so sperm cannot reach an egg. The procedure takes about 3 months to be effective. Another form of birth control must be used during those 3 months. Female sterilization This is a procedure to tie off the tubes that carry sperm (vasectomy). After the procedure, the man can still ejaculate fluid (semen). Another form of birth control must be used for 3 months after the procedure. Natural planning methods Natural family planning In this method, a couple does not have sex on days when the woman could become pregnant. Calendar method In this method, the woman keeps track of the length of each menstrual cycle, identifies the days when pregnancy can happen, and does not have sex on those days. Ovulation method In this method, a couple avoids sex during ovulation. Symptothermal method This method involves not having sex during ovulation. The woman typically checks for ovulation by watching changes in her temperature and in the consistency of cervical mucus. Post-ovulation method In this method, a couple waits to have sex until after ovulation. Where to find more information  Centers for Disease Control and Prevention: http://www.wolf.info/ Summary  Contraception, also called birth control, refers to methods or  devices that prevent pregnancy.  Hormonal methods of contraception include implants, injections, pills, patches, vaginal rings, and emergency contraceptives.  Barrier methods of contraception can include female condoms, female condoms, diaphragms, cervical caps, sponges, and spermicides.  There are two types of IUDs (intrauterine devices). An IUD can be put in a woman's uterus to prevent pregnancy for 3-5 years.  Permanent sterilization can be done through a procedure for males and females. Natural family planning methods involve nothaving sex on days when the woman could become pregnant. This information is not intended to replace advice given to you by your health care provider. Make sure you discuss any questions you have with your health care provider. Document Revised: 08/27/2019 Document Reviewed: 08/27/2019 Elsevier Patient Education  [place].   Breastfeeding  Choosing to breastfeed is one of the best decisions you can make for yourself and your baby. A change in hormones during pregnancy causes your breasts to make breast milk in your milk-producing glands. Hormones prevent breast milk from being released before your baby is born. They also prompt milk flow after birth. Once breastfeeding has begun, thoughts of your baby, as well as his or her sucking or crying, can stimulate the release of milk from your milk-producing glands. Benefits of breastfeeding Research shows that breastfeeding offers many health benefits  for infants and mothers. It also offers a cost-free and convenient way to feed your baby. For your baby  Your first milk (colostrum) helps your baby's digestive system to function better.  Special cells in your milk (antibodies) help your baby to fight off infections.  Breastfed babies are less likely to develop asthma, allergies, obesity, or type 2 diabetes. They are also at lower risk for sudden infant death syndrome (SIDS).  Nutrients in breast milk are better  able to meet your baby's needs compared to infant formula.  Breast milk improves your baby's brain development. For you  Breastfeeding helps to create a very special bond between you and your baby.  Breastfeeding is convenient. Breast milk costs nothing and is always available at the correct temperature.  Breastfeeding helps to burn calories. It helps you to lose the weight that you gained during pregnancy.  Breastfeeding makes your uterus return faster to its size before pregnancy. It also slows bleeding (lochia) after you give birth.  Breastfeeding helps to lower your risk of developing type 2 diabetes, osteoporosis, rheumatoid arthritis, cardiovascular disease, and breast, ovarian, uterine, and endometrial cancer later in life. Breastfeeding basics Starting breastfeeding  Find a comfortable place to sit or lie down, with your neck and back well-supported.  Place a pillow or a rolled-up blanket under your baby to bring him or her to the level of your breast (if you are seated). Nursing pillows are specially designed to help support your arms and your baby while you breastfeed.  Make sure that your baby's tummy (abdomen) is facing your abdomen.  Gently massage your breast. With your fingertips, massage from the outer edges of your breast inward toward the nipple. This encourages milk flow. If your milk flows slowly, you may need to continue this action during the feeding.  Support your breast with 4 fingers underneath and your thumb above your nipple (make the letter "C" with your hand). Make sure your fingers are well away from your nipple and your baby's mouth.  Stroke your baby's lips gently with your finger or nipple.  When your baby's mouth is open wide enough, quickly bring your baby to your breast, placing your entire nipple and as much of the areola as possible into your baby's mouth. The areola is the colored area around your nipple. ? More areola should be visible above your  baby's upper lip than below the lower lip. ? Your baby's lips should be opened and extended outward (flanged) to ensure an adequate, comfortable latch. ? Your baby's tongue should be between his or her lower gum and your breast.  Make sure that your baby's mouth is correctly positioned around your nipple (latched). Your baby's lips should create a seal on your breast and be turned out (everted).  It is common for your baby to suck about 2-3 minutes in order to start the flow of breast milk. Latching Teaching your baby how to latch onto your breast properly is very important. An improper latch can cause nipple pain, decreased milk supply, and poor weight gain in your baby. Also, if your baby is not latched onto your nipple properly, he or she may swallow some air during feeding. This can make your baby fussy. Burping your baby when you switch breasts during the feeding can help to get rid of the air. However, teaching your baby to latch on properly is still the best way to prevent fussiness from swallowing air while breastfeeding. Signs that your baby has successfully latched onto  your nipple  Silent tugging or silent sucking, without causing you pain. Infant's lips should be extended outward (flanged).  Swallowing heard between every 3-4 sucks once your milk has started to flow (after your let-down milk reflex occurs).  Muscle movement above and in front of his or her ears while sucking. Signs that your baby has not successfully latched onto your nipple  Sucking sounds or smacking sounds from your baby while breastfeeding.  Nipple pain. If you think your baby has not latched on correctly, slip your finger into the corner of your baby's mouth to break the suction and place it between your baby's gums. Attempt to start breastfeeding again. Signs of successful breastfeeding Signs from your baby  Your baby will gradually decrease the number of sucks or will completely stop sucking.  Your baby  will fall asleep.  Your baby's body will relax.  Your baby will retain a small amount of milk in his or her mouth.  Your baby will let go of your breast by himself or herself. Signs from you  Breasts that have increased in firmness, weight, and size 1-3 hours after feeding.  Breasts that are softer immediately after breastfeeding.  Increased milk volume, as well as a change in milk consistency and color by the fifth day of breastfeeding.  Nipples that are not sore, cracked, or bleeding. Signs that your baby is getting enough milk  Wetting at least 1-2 diapers during the first 24 hours after birth.  Wetting at least 5-6 diapers every 24 hours for the first week after birth. The urine should be clear or pale yellow by the age of 5 days.  Wetting 6-8 diapers every 24 hours as your baby continues to grow and develop.  At least 3 stools in a 24-hour period by the age of 5 days. The stool should be soft and yellow.  At least 3 stools in a 24-hour period by the age of 7 days. The stool should be seedy and yellow.  No loss of weight greater than 10% of birth weight during the first 3 days of life.  Average weight gain of 4-7 oz (113-198 g) per week after the age of 4 days.  Consistent daily weight gain by the age of 5 days, without weight loss after the age of 2 weeks. After a feeding, your baby may spit up a small amount of milk. This is normal. Breastfeeding frequency and duration Frequent feeding will help you make more milk and can prevent sore nipples and extremely full breasts (breast engorgement). Breastfeed when you feel the need to reduce the fullness of your breasts or when your baby shows signs of hunger. This is called "breastfeeding on demand." Signs that your baby is hungry include:  Increased alertness, activity, or restlessness.  Movement of the head from side to side.  Opening of the mouth when the corner of the mouth or cheek is stroked (rooting).  Increased  sucking sounds, smacking lips, cooing, sighing, or squeaking.  Hand-to-mouth movements and sucking on fingers or hands.  Fussing or crying. Avoid introducing a pacifier to your baby in the first 4-6 weeks after your baby is born. After this time, you may choose to use a pacifier. Research has shown that pacifier use during the first year of a baby's life decreases the risk of sudden infant death syndrome (SIDS). Allow your baby to feed on each breast as long as he or she wants. When your baby unlatches or falls asleep while feeding from the  first breast, offer the second breast. Because newborns are often sleepy in the first few weeks of life, you may need to awaken your baby to get him or her to feed. Breastfeeding times will vary from baby to baby. However, the following rules can serve as a guide to help you make sure that your baby is properly fed:  Newborns (babies 26 weeks of age or younger) may breastfeed every 1-3 hours.  Newborns should not go without breastfeeding for longer than 3 hours during the day or 5 hours during the night.  You should breastfeed your baby a minimum of 8 times in a 24-hour period. Breast milk pumping Pumping and storing breast milk allows you to make sure that your baby is exclusively fed your breast milk, even at times when you are unable to breastfeed. This is especially important if you go back to work while you are still breastfeeding, or if you are not able to be present during feedings. Your lactation consultant can help you find a method of pumping that works best for you and give you guidelines about how long it is safe to store breast milk.      Caring for your breasts while you breastfeed Nipples can become dry, cracked, and sore while breastfeeding. The following recommendations can help keep your breasts moisturized and healthy:  Avoid using soap on your nipples.  Wear a supportive bra designed especially for nursing. Avoid wearing underwire-style  bras or extremely tight bras (sports bras).  Air-dry your nipples for 3-4 minutes after each feeding.  Use only cotton bra pads to absorb leaked breast milk. Leaking of breast milk between feedings is normal.  Use lanolin on your nipples after breastfeeding. Lanolin helps to maintain your skin's normal moisture barrier. Pure lanolin is not harmful (not toxic) to your baby. You may also hand express a few drops of breast milk and gently massage that milk into your nipples and allow the milk to air-dry. In the first few weeks after giving birth, some women experience breast engorgement. Engorgement can make your breasts feel heavy, warm, and tender to the touch. Engorgement peaks within 3-5 days after you give birth. The following recommendations can help to ease engorgement:  Completely empty your breasts while breastfeeding or pumping. You may want to start by applying warm, moist heat (in the shower or with warm, water-soaked hand towels) just before feeding or pumping. This increases circulation and helps the milk flow. If your baby does not completely empty your breasts while breastfeeding, pump any extra milk after he or she is finished.  Apply ice packs to your breasts immediately after breastfeeding or pumping, unless this is too uncomfortable for you. To do this: ? Put ice in a plastic bag. ? Place a towel between your skin and the bag. ? Leave the ice on for 20 minutes, 2-3 times a day.  Make sure that your baby is latched on and positioned properly while breastfeeding. If engorgement persists after 48 hours of following these recommendations, contact your health care provider or a Science writer. Overall health care recommendations while breastfeeding  Eat 3 healthy meals and 3 snacks every day. Well-nourished mothers who are breastfeeding need an additional 450-500 calories a day. You can meet this requirement by increasing the amount of a balanced diet that you eat.  Drink  enough water to keep your urine pale yellow or clear.  Rest often, relax, and continue to take your prenatal vitamins to prevent fatigue, stress, and low  vitamin and mineral levels in your body (nutrient deficiencies).  Do not use any products that contain nicotine or tobacco, such as cigarettes and e-cigarettes. Your baby may be harmed by chemicals from cigarettes that pass into breast milk and exposure to secondhand smoke. If you need help quitting, ask your health care provider.  Avoid alcohol.  Do not use illegal drugs or marijuana.  Talk with your health care provider before taking any medicines. These include over-the-counter and prescription medicines as well as vitamins and herbal supplements. Some medicines that may be harmful to your baby can pass through breast milk.  It is possible to become pregnant while breastfeeding. If birth control is desired, ask your health care provider about options that will be safe while breastfeeding your baby. Where to find more information: [employer]: www.llli.org Contact a health care provider if:  You feel like you want to stop breastfeeding or have become frustrated with breastfeeding.  Your nipples are cracked or bleeding.  Your breasts are red, tender, or warm.  You have: ? Painful breasts or nipples. ? A swollen area on either breast. ? A fever or chills. ? Nausea or vomiting. ? Drainage other than breast milk from your nipples.  Your breasts do not become full before feedings by the fifth day after you give birth.  You feel sad and depressed.  Your baby is: ? Too sleepy to eat well. ? Having trouble sleeping. ? More than 71 week old and wetting fewer than 6 diapers in a 24-hour period. ? Not gaining weight by 55 days of age.  Your baby has fewer than 3 stools in a 24-hour period.  Your baby's skin or the white parts of his or her eyes become yellow. Get help right away if:  Your baby is overly tired  (lethargic) and does not want to wake up and feed.  Your baby develops an unexplained fever. Summary  Breastfeeding offers many health benefits for infant and mothers.  Try to breastfeed your infant when he or she shows early signs of hunger.  Gently tickle or stroke your baby's lips with your finger or nipple to allow the baby to open his or her mouth. Bring the baby to your breast. Make sure that much of the areola is in your baby's mouth. Offer one side and burp the baby before you offer the other side.  Talk with your health care provider or lactation consultant if you have questions or you face problems as you breastfeed. This information is not intended to replace advice given to you by your health care provider. Make sure you discuss any questions you have with your health care provider. Document Revised: 06/16/2017 Document Reviewed: 04/23/2016 Elsevier Patient Education  2021 [employer].

## 2020-09-04 ENCOUNTER — Other Ambulatory Visit: Payer: Self-pay | Admitting: *Deleted

## 2020-09-04 DIAGNOSIS — O09522 Supervision of elderly multigravida, second trimester: Secondary | ICD-10-CM

## 2020-09-16 NOTE — Progress Notes (Signed)
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 She has followed with [facility] since 07/21/2020 for consultative assistance with management of her Hyperthyroidism .   HISTORICAL SUMMARY:   She was noted to have hyperthyroidism during routine labs on 07/09/2020 with a  TSH of 0.03 uIU/mL and elevated T3 and T4 at 200 ng/dl and 15.1 ug/dL respectively, she was pregnant at the time  She was started on PTU at ~ 12 weeks of gestation due to a total T4 of 17.9 mcg/dl and suppressed TSH. Discontinued by 21 weeks of gestation due to normalization of TFT's  No FH of thyroid disease  She is an LPN    SUBJECTIVE:   Today (09/17/2020):  [name] is here for Hyperthyroidism  She is currently 21.1 weeks of gestation  She denies diarrhea or constipation  Denies fever or abdominal pain  Denies burning or itching in the eyes    PTU 50 mg half a tablet daily   HISTORY:  Past Medical History:  Past Medical History:  Diagnosis Date   Anemia    Fibroid    Fibroids    Past Surgical History:  Past Surgical History:  Procedure Laterality Date   CESAREAN SECTION     LAPAROSCOPIC GELPORT ASSISTED MYOMECTOMY N/A 07/04/2019   Procedure: LAPAROSCOPIC GELPORT ASSISTED MYOMECTOMY ABLATION OF ENDOMETRIOSIS  TRANSMYOMETRIAL HYSTEROSCOPY LYSIS OF ADHESIONS AND CHROMOTUBATION;  Surgeon: Governor Specking, MD;  Location: [facility];  Service: Gynecology;  Laterality: N/A;  [name] confirmed on 06/18/19 CS   Social History:  reports that she has never smoked. She has never used smokeless tobacco. She reports current alcohol use. She reports that she does not use  drugs. Family History: No family history on file.   HOME MEDICATIONS: Allergies as of 09/17/2020   No Known Allergies      Medication List        Accurate as of September 17, 2020  7:39 AM. If you have any questions, ask your nurse or doctor.          acetaminophen 500 MG tablet Commonly known as: TYLENOL Take 500 mg by mouth every 6 (six) hours as needed for moderate pain.   aspirin EC 81 MG tablet Take 1 tablet (81 mg total) by mouth daily. Take after 12 weeks for prevention of preeclampsia later in pregnancy   Doxylamine-Pyridoxine 10-10 MG Tbec Commonly known as: Diclegis Take 2-4 tablets by mouth daily. Start with 2 tablets daily at night.  If the symptoms persist after 2 days increase to 1 tablet in the morning and 2 tablets at night.  You may further increase to 1 tablet in the morning, 1 tablet at lunch, and 2 tablets at night.  Max dose of 4 tablets/day.   ferrous sulfate 325 (65 FE) MG EC tablet Take 1 tablet (325 mg total) by mouth daily.   metroNIDAZOLE 500 MG tablet Commonly known as: FLAGYL Take 1 tablet (500 mg total) by mouth 2 (two) times daily.   prenatal vitamin w/FE, FA 27-1 MG Tabs tablet Take 1 tablet by mouth daily at 12 noon.   propylthiouracil 50 MG tablet Commonly known as: PTU Take  1 tablet (50 mg total) by mouth daily. What changed: how much to take   terconazole 0.8 % vaginal cream Commonly known as: TERAZOL 3 Place 1 applicator vaginally at bedtime. Apply nightly for three nights.          OBJECTIVE:   PHYSICAL EXAM: VS: BP 116/60   Pulse 98   Ht 5\' 6"  (1.676 m)   Wt 176 lb (79.8 kg)   LMP 04/29/2020 (Exact Date)   SpO2 99%   BMI 28.41 kg/m    EXAM: General: Pt appears well and is in NAD  Hydration: Well-hydrated with moist mucous membranes and good skin turgor  Eyes: External eye exam normal without stare, lid lag or exophthalmos.  EOM intact.  PERRL.  Ears, Nose, Throat: Hearing: Grossly intact bilaterally Dental:  Good dentition  Throat: Clear without mass, erythema or exudate  Neck: General: Supple without adenopathy. Thyroid: Thyroid size normal.  No goiter or nodules appreciated. No thyroid bruit.  Lungs: Clear with good BS bilat with no rales, rhonchi, or wheezes  Heart: Auscultation: RRR.  Abdomen: Normoactive bowel sounds, soft, nontender, without masses or organomegaly palpable  Extremities: Gait and station: Normal gait  Digits and nails: No clubbing, cyanosis, petechiae, or nodes Head and neck: Normal alignment and mobility BL UE: Normal ROM and strength. BL LE: No pretibial edema normal ROM and strength.  Skin: Hair: Texture and amount normal with gender appropriate distribution Skin Inspection: No rashes, acanthosis nigricans/skin tags. No lipohypertrophy Skin Palpation: Skin temperature, texture, and thickness normal to palpation  Neuro: Cranial nerves: II - XII grossly intact  Cerebellar: Normal coordination and movement; no tremor Motor: Normal strength throughout DTRs: 2+ and symmetric in UE without delay in relaxation phase  Mental Status: Judgment, insight: Intact Orientation: Oriented to time, place, and person Memory: Intact for recent and remote events Mood and affect: No depression, anxiety, or agitation     DATA REVIEWED:  Results for [name], [name] (MRN [id number]) as of 09/18/2020 07:26  Ref. Range 09/17/2020 11:10 09/17/2020 11:11  TSH Latest Ref Range: 0.35 - 4.50 uIU/mL 1.00   Thyroxine (T4) Latest Ref Range: 5.1 - 11.9 mcg/dL  10.4    ASSESSMENT / PLAN / RECOMMENDATIONS:   Hyperthyroidism during pregnancy :  - Pt with fatigue  - She was started on PTU during first trimester, repeat TFT's normal and will stop   Medications   Stop PTU    F/U in 2 months Labs in 4 weeks    Signed electronically by: Mack Guise, MD  [facility]  [address] [place], [place] [address] Phone:  [phone] FAX: [phone]      CC: Ezequiel Essex, MD [place] [address] Phone: [phone]  Fax: [phone]   Return to Endocrinology clinic as below: Future Appointments  Date Time Provider [place]  09/17/2020 10:50 AM Bralyn Folkert, Melanie Crazier, MD [facility] None  10/02/2020  7:45 AM [facility] NURSE [facility]  10/02/2020  8:00 AM [facility] US1 [facility]  10/02/2020 10:30 AM Sloan Leiter, MD [facility] None

## 2020-09-17 ENCOUNTER — Ambulatory Visit (INDEPENDENT_AMBULATORY_CARE_PROVIDER_SITE_OTHER): Payer: BC Managed Care – PPO | Admitting: Internal Medicine

## 2020-09-17 ENCOUNTER — Other Ambulatory Visit: Payer: Self-pay

## 2020-09-17 ENCOUNTER — Encounter: Payer: Self-pay | Admitting: Internal Medicine

## 2020-09-17 VITALS — BP 116/60 | HR 98 | Ht 66.0 in | Wt 176.0 lb

## 2020-09-17 DIAGNOSIS — O99280 Endocrine, nutritional and metabolic diseases complicating pregnancy, unspecified trimester: Secondary | ICD-10-CM | POA: Diagnosis not present

## 2020-09-17 DIAGNOSIS — E0590 Thyrotoxicosis, unspecified without thyrotoxic crisis or storm: Secondary | ICD-10-CM

## 2020-09-17 LAB — TSH: TSH: 1 u[IU]/mL (ref 0.35–4.50)

## 2020-09-18 LAB — T4: T4, Total: 10.4 ug/dL (ref 5.1–11.9)

## 2020-10-02 ENCOUNTER — Other Ambulatory Visit: Payer: Self-pay | Admitting: Family Medicine

## 2020-10-02 ENCOUNTER — Ambulatory Visit: Payer: BC Managed Care – PPO | Attending: Obstetrics

## 2020-10-02 ENCOUNTER — Encounter: Payer: Self-pay | Admitting: *Deleted

## 2020-10-02 ENCOUNTER — Encounter: Payer: Self-pay | Admitting: Obstetrics and Gynecology

## 2020-10-02 ENCOUNTER — Other Ambulatory Visit: Payer: Self-pay | Admitting: *Deleted

## 2020-10-02 ENCOUNTER — Ambulatory Visit: Payer: BC Managed Care – PPO | Admitting: *Deleted

## 2020-10-02 ENCOUNTER — Ambulatory Visit (INDEPENDENT_AMBULATORY_CARE_PROVIDER_SITE_OTHER): Payer: BC Managed Care – PPO | Admitting: Obstetrics and Gynecology

## 2020-10-02 ENCOUNTER — Other Ambulatory Visit: Payer: Self-pay

## 2020-10-02 VITALS — BP 113/60 | HR 73

## 2020-10-02 VITALS — BP 106/67 | HR 69 | Wt 181.0 lb

## 2020-10-02 DIAGNOSIS — E0590 Thyrotoxicosis, unspecified without thyrotoxic crisis or storm: Secondary | ICD-10-CM

## 2020-10-02 DIAGNOSIS — O99282 Endocrine, nutritional and metabolic diseases complicating pregnancy, second trimester: Secondary | ICD-10-CM | POA: Diagnosis not present

## 2020-10-02 DIAGNOSIS — O34219 Maternal care for unspecified type scar from previous cesarean delivery: Secondary | ICD-10-CM

## 2020-10-02 DIAGNOSIS — Z3A23 23 weeks gestation of pregnancy: Secondary | ICD-10-CM

## 2020-10-02 DIAGNOSIS — O09522 Supervision of elderly multigravida, second trimester: Secondary | ICD-10-CM

## 2020-10-02 DIAGNOSIS — Z363 Encounter for antenatal screening for malformations: Secondary | ICD-10-CM | POA: Diagnosis not present

## 2020-10-02 DIAGNOSIS — Z148 Genetic carrier of other disease: Secondary | ICD-10-CM

## 2020-10-02 DIAGNOSIS — O0990 Supervision of high risk pregnancy, unspecified, unspecified trimester: Secondary | ICD-10-CM | POA: Insufficient documentation

## 2020-10-02 DIAGNOSIS — O09 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, unspecified trimester: Secondary | ICD-10-CM

## 2020-10-02 DIAGNOSIS — O99280 Endocrine, nutritional and metabolic diseases complicating pregnancy, unspecified trimester: Secondary | ICD-10-CM

## 2020-10-02 DIAGNOSIS — O09292 Supervision of pregnancy with other poor reproductive or obstetric history, second trimester: Secondary | ICD-10-CM

## 2020-10-02 DIAGNOSIS — O09299 Supervision of pregnancy with other poor reproductive or obstetric history, unspecified trimester: Secondary | ICD-10-CM

## 2020-10-02 NOTE — Progress Notes (Signed)
   PRENATAL VISIT NOTE  Subjective:  [name] is a 35 y.o. G2P1001 at [redacted]w[redacted]d being seen today for ongoing prenatal care.  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  Patient reports no complaints.  Contractions: Not present. Vag. Bleeding: None.  Movement: Present. Denies leaking of fluid.   The following portions of the patient's history were reviewed and updated as appropriate: allergies, current medications, past family history, past medical history, past social history, past surgical history and problem list.   Objective:   Vitals:   10/02/20 0957  BP: 106/67  Pulse: 69  Weight: 181 lb (82.1 kg)    Fetal Status: Fetal Heart Rate (bpm): 140   Movement: Present     General:  Alert, oriented and cooperative. Patient is in no acute distress.  Skin: Skin is warm and dry. No rash noted.   Cardiovascular: Normal heart rate noted  Respiratory: Normal respiratory effort, no problems with respiration noted  Abdomen: Soft, gravid, appropriate for gestational age.  Pain/Pressure: Absent     Pelvic: Cervical exam deferred        Extremities: Normal range of motion.  Edema: None  Mental Status: Normal mood and affect. Normal behavior. Normal judgment and thought content.   Assessment and Plan:  Pregnancy: G2P1001 at [redacted]w[redacted]d  1. Hyperthyroidism during pregnancy - Endo stopped PTU several weeks ago bc TFTs normalized - has repeat lab work to see if remains normal  2. Supervision of pregnancy  with history of infertility - reviewed weight gain today, she had lost weight and has gained back  3. Supervision of high risk pregnancy, antepartum  4. Previous cesarean delivery affecting pregnancy, antepartum - Will need repeat due to h/o myomectomy  5. Multigravida of advanced maternal age in second trimester  6. History of prior pregnancy with short cervix, currently pregnant  7. [redacted] weeks gestation of pregnancy  Preterm labor symptoms and general obstetric precautions including but not limited to vaginal bleeding, contractions, leaking of fluid and fetal movement were reviewed in detail with the patient. Please refer to After Visit Summary for other counseling recommendations.   Return in about 4 weeks (around 10/30/2020) for high OB, in person, 2 hr GTT, 3rd trim labs.  Future Appointments  Date Time Provider [place]  10/17/2020  9:00 AM [facility] LAB [facility] None  10/30/2020  8:30 AM [facility] LAB [facility] None  10/30/2020  8:45 AM Wood[place] Mode, MD [place] None  11/21/2020  1:40 PM Shamleffer, Melanie Crazier, MD [facility] None  11/27/2020  7:45 AM [facility] NURSE [facility]  11/27/2020  8:00 AM [facility] US1 [facility] [place]    Sloan Leiter, MD

## 2020-10-02 NOTE — Progress Notes (Signed)
Updated OB tab dating section with new [place] dating. Same EDD, consistent with [place] at 10.3w.   Ezequiel Essex, MD

## 2020-10-15 ENCOUNTER — Other Ambulatory Visit: Payer: Self-pay

## 2020-10-15 ENCOUNTER — Other Ambulatory Visit (INDEPENDENT_AMBULATORY_CARE_PROVIDER_SITE_OTHER): Payer: BC Managed Care – PPO

## 2020-10-15 DIAGNOSIS — E0590 Thyrotoxicosis, unspecified without thyrotoxic crisis or storm: Secondary | ICD-10-CM

## 2020-10-15 DIAGNOSIS — O99280 Endocrine, nutritional and metabolic diseases complicating pregnancy, unspecified trimester: Secondary | ICD-10-CM | POA: Diagnosis not present

## 2020-10-15 LAB — TSH: TSH: 1.07 u[IU]/mL (ref 0.35–5.50)

## 2020-10-16 LAB — T4: T4, Total: 10.8 ug/dL (ref 5.1–11.9)

## 2020-10-17 ENCOUNTER — Other Ambulatory Visit: Payer: BC Managed Care – PPO

## 2020-10-27 ENCOUNTER — Other Ambulatory Visit: Payer: Self-pay

## 2020-10-27 ENCOUNTER — Telehealth: Payer: Self-pay

## 2020-10-27 DIAGNOSIS — N898 Other specified noninflammatory disorders of vagina: Secondary | ICD-10-CM

## 2020-10-27 NOTE — Telephone Encounter (Signed)
Return call to pt regarding Rx request for yeast  Pt states she has tried terazol 3 before and had sx's a few days after  Pt has upcoming appt pt advised she can have a vaginal swab to confirm if yeast or BV and then go from there w/ treatment Rx pt agreeable

## 2020-10-30 ENCOUNTER — Ambulatory Visit (INDEPENDENT_AMBULATORY_CARE_PROVIDER_SITE_OTHER): Payer: BC Managed Care – PPO | Admitting: Obstetrics & Gynecology

## 2020-10-30 ENCOUNTER — Other Ambulatory Visit: Payer: Self-pay

## 2020-10-30 ENCOUNTER — Other Ambulatory Visit (HOSPITAL_COMMUNITY)
Admission: RE | Admit: 2020-10-30 | Discharge: 2020-10-30 | Disposition: A | Discharge status: Home / Self Care | Payer: BC Managed Care – PPO | Source: Ambulatory Visit | Attending: Obstetrics & Gynecology | Admitting: Obstetrics & Gynecology

## 2020-10-30 ENCOUNTER — Other Ambulatory Visit: Payer: BC Managed Care – PPO

## 2020-10-30 VITALS — BP 104/65 | HR 86 | Wt 186.0 lb

## 2020-10-30 DIAGNOSIS — N898 Other specified noninflammatory disorders of vagina: Secondary | ICD-10-CM

## 2020-10-30 DIAGNOSIS — Z23 Encounter for immunization: Secondary | ICD-10-CM | POA: Diagnosis not present

## 2020-10-30 DIAGNOSIS — O09 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, unspecified trimester: Secondary | ICD-10-CM

## 2020-10-30 DIAGNOSIS — Z3A27 27 weeks gestation of pregnancy: Secondary | ICD-10-CM

## 2020-10-30 DIAGNOSIS — O0990 Supervision of high risk pregnancy, unspecified, unspecified trimester: Secondary | ICD-10-CM | POA: Diagnosis not present

## 2020-10-30 DIAGNOSIS — O26899 Other specified pregnancy related conditions, unspecified trimester: Secondary | ICD-10-CM | POA: Insufficient documentation

## 2020-10-30 NOTE — Progress Notes (Signed)
+   Fetal movement. Pt c/o possible yeast infection. Will have patient collect self swab and send out. Pt would also like to discuss scheduling CSection today.

## 2020-10-30 NOTE — Patient Instructions (Signed)
Third Trimester of Pregnancy  The third trimester of pregnancy is from week 28 through week 59. This is also called months 7 through 9. This trimester is when your unborn baby (fetus) is growing very fast. At the end of the ninth month, the unborn baby is about20 inches long. It weighs about 6-10 pounds. Body changes during your third trimester Your body continues to go through many changes during this time. The changesvary and generally return to normal after the baby is born. Physical changes Your weight will continue to increase. You may gain 25-35 pounds (11-16 kg) by the end of the pregnancy. If you are underweight, you may gain 28-40 lb (about 13-18 kg). If you are overweight, you may gain 15-25 lb (about 7-11 kg). You may start to get stretch marks on your hips, belly (abdomen), and breasts. Your breasts will continue to grow and may hurt. A yellow fluid (colostrum) may leak from your breasts. This is the first milk you are making for your baby. You may have changes in your hair. Your belly button may stick out. You may have more swelling in your hands, face, or ankles. Health changes You may have heartburn. You may have trouble pooping (constipation). You may get hemorrhoids. These are swollen veins in the butt that can itch or get painful. You may have swollen veins (varicose veins) in your legs. You may have more body aches in the pelvis, back, or thighs. You may have more tingling or numbness in your hands, arms, and legs. The skin on your belly may also feel numb. You may feel short of breath as your womb (uterus) gets bigger. Other changes You may pee (urinate) more often. You may have more problems sleeping. You may notice the unborn baby "dropping," or moving lower in your belly. You may have more discharge coming from your vagina. Your joints may feel loose, and you may have pain around your pelvic bone. Follow these instructions at home: Medicines Take over-the-counter  and prescription medicines only as told by your doctor. Some medicines are not safe during pregnancy. Take a prenatal vitamin that contains at least 600 micrograms (mcg) of folic acid. Eating and drinking Eat healthy meals that include: Fresh fruits and vegetables. Whole grains. Good sources of protein, such as meat, eggs, or tofu. Low-fat dairy products. Avoid raw meat and unpasteurized juice, milk, and cheese. These carry germs that can harm you and your baby. Eat 4 or 5 small meals rather than 3 large meals a day. You may need to take these actions to prevent or treat trouble pooping: Drink enough fluids to keep your pee (urine) pale yellow. Eat foods that are high in fiber. These include beans, whole grains, and fresh fruits and vegetables. Limit foods that are high in fat and sugar. These include fried or sweet foods. Activity Exercise only as told by your doctor. Stop exercising if you start to have cramps in your womb. Avoid heavy lifting. Do not exercise if it is too hot or too humid, or if you are in a place of great height (high altitude). If you choose to, you may have sex unless your doctor tells you not to. Relieving pain and discomfort Take breaks often, and rest with your legs raised (elevated) if you have leg cramps or low back pain. Take warm water baths (sitz baths) to soothe pain or discomfort caused by hemorrhoids. Use hemorrhoid cream if your doctor approves. Wear a good support bra if your breasts are tender. If  you develop bulging, swollen veins in your legs: Wear support hose as told by your doctor. Raise your feet for 15 minutes, 3-4 times a day. Limit salt in your food. Safety Talk to your doctor before traveling far distances. Do not use hot tubs, steam rooms, or saunas. Wear your seat belt at all times when you are in a car. Talk with your doctor if someone is hurting you or yelling at you a lot. Preparing for your baby's arrival To prepare for the arrival  of your baby: Take prenatal classes. Visit the hospital and tour the maternity area. Buy a rear-facing car seat. Learn how to install it in your car. Prepare the baby's room. Take out all pillows and stuffed animals from the baby's crib. General instructions Avoid cat litter boxes and soil used by cats. These carry germs that can cause harm to the baby and can cause a loss of your baby by miscarriage or stillbirth. Do not douche or use tampons. Do not use scented sanitary pads. Do not smoke or use any products that contain nicotine or tobacco. If you need help quitting, ask your doctor. Do not drink alcohol. Do not use herbal medicines, illegal drugs, or medicines that were not approved by your doctor. Chemicals in these products can affect your baby. Keep all follow-up visits. This is important. Where to find more information American Pregnancy Association: americanpregnancy.org [employer]: www.acog.org Office on Women's Health: KeywordPortfolios.com.br Contact a doctor if: You have a fever. You have mild cramps or pressure in your lower belly. You have a nagging pain in your belly area. You vomit, or you have watery poop (diarrhea). You have bad-smelling fluid coming from your vagina. You have pain when you pee, or your pee smells bad. You have a headache that does not go away when you take medicine. You have changes in how you see, or you see spots in front of your eyes. Get help right away if: Your water breaks. You have regular contractions that are less than 5 minutes apart. You are spotting or bleeding from your vagina. You have very bad belly cramps or pain. You have trouble breathing. You have chest pain. You faint. You have not felt the baby move for the amount of time told by your doctor. You have new or increased pain, swelling, or redness in an arm or leg. Summary The third trimester is from week 28 through week 40 (months 7  through 9). This is the time when your unborn baby is growing very fast. During this time, your discomfort may increase as you gain weight and as your baby grows. Get ready for your baby to arrive by taking prenatal classes, buying a rear-facing car seat, and preparing the baby's room. Get help right away if you are bleeding from your vagina, you have chest pain and trouble breathing, or you have not felt the baby move for the amount of time told by your doctor. This information is not intended to replace advice given to you by your health care provider. Make sure you discuss any questions you have with your healthcare provider. Document Revised: 08/29/2019 Document Reviewed: 07/05/2019 Elsevier Patient Education  2022 [employer].

## 2020-10-30 NOTE — Progress Notes (Signed)
   PRENATAL VISIT NOTE  Subjective:  [name] is a 35 y.o. G2P1001 at 41w2dbeing seen today for ongoing prenatal care.  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  Patient reports vaginal irritation.  Contractions: Not present. Vag. Bleeding: None.  Movement: Present. Denies leaking of fluid.   The following portions of the patient's history were reviewed and updated as appropriate: allergies, current medications, past family history, past medical history, past social history, past surgical history and problem list.   Objective:   Vitals:   10/30/20 0845  BP: 104/65  Pulse: 86  Weight: 186 lb (84.4 kg)    Fetal Status:     Movement: Present     General:  Alert, oriented and cooperative. Patient is in no acute distress.  Skin: Skin is warm and dry. No rash noted.   Cardiovascular: Normal heart rate noted  Respiratory: Normal respiratory effort, no problems with respiration noted  Abdomen: Soft, gravid, appropriate for gestational age.  Pain/Pressure: Absent     Pelvic: Cervical exam deferred        Extremities: Normal range of motion.  Edema: Trace  Mental Status: Normal mood and affect. Normal behavior. Normal judgment and thought content.   Assessment and Plan:  Pregnancy: G2P1001 at 268w2d. 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 S/p myomectomy, repeat CS at 37 weeks requested scheduling  2. Supervision of high risk pregnancy, antepartum Routine screening -  Glucose Tolerance, 2 Hours w/1 Hour - CBC - RPR - HIV Antibody (routine testing w rflx) - Tdap vaccine greater than or equal to 7yo IM  3. [redacted] weeks gestation of pregnancy routine - Glucose Tolerance, 2 Hours w/1 Hour - CBC - RPR - HIV Antibody (routine testing w rflx)  4. Vaginal discharge during pregnancy, antepartum Self swab for dx obtained - Cervicovaginal ancillary only( [place] Preterm labor symptoms and general obstetric precautions including but not limited to vaginal bleeding, contractions, leaking of fluid and fetal movement were reviewed in detail with the patient. Please refer to After Visit Summary for other counseling recommendations.   Return in about 3 weeks (around 11/20/2020).  Future Appointments  Date Time Provider [place]8/19/2022  1:40 PM Shamleffer, IbMelanie CrazierMD [facility] None  11/27/2020  7:45 AM [facility]/25/[facility]  8:00 AM [facility] US1 [facility]    JaEmeterio ReeveMD

## 2020-10-31 LAB — CBC
Hematocrit: 28.9 % — ABNORMAL LOW (ref 34.0–46.6)
Hemoglobin: 9.3 g/dL — ABNORMAL LOW (ref 11.1–15.9)
MCH: 27.8 pg (ref 26.6–33.0)
MCHC: 32.2 g/dL (ref 31.5–35.7)
MCV: 86 fL (ref 79–97)
Platelets: 180 10*3/uL (ref 150–450)
RBC: 3.35 x10E6/uL — ABNORMAL LOW (ref 3.77–5.28)
RDW: 14.6 % (ref 11.7–15.4)
WBC: 9.2 10*3/uL (ref 3.4–10.8)

## 2020-10-31 LAB — CERVICOVAGINAL ANCILLARY ONLY
Bacterial Vaginitis (gardnerella): POSITIVE — AB
Candida Glabrata: NEGATIVE
Candida Vaginitis: POSITIVE — AB
Chlamydia: NEGATIVE
Comment: NEGATIVE
Comment: NEGATIVE
Comment: NEGATIVE
Comment: NEGATIVE
Comment: NEGATIVE
Comment: NORMAL
Neisseria Gonorrhea: NEGATIVE
Trichomonas: NEGATIVE

## 2020-10-31 LAB — GLUCOSE TOLERANCE, 2 HOURS W/ 1HR
Glucose, 1 hour: 128 mg/dL (ref 65–179)
Glucose, 2 hour: 82 mg/dL (ref 65–152)
Glucose, Fasting: 83 mg/dL (ref 65–91)

## 2020-10-31 LAB — RPR: RPR Ser Ql: NONREACTIVE

## 2020-10-31 LAB — HIV ANTIBODY (ROUTINE TESTING W REFLEX): HIV Screen 4th Generation wRfx: NONREACTIVE

## 2020-11-03 ENCOUNTER — Other Ambulatory Visit: Payer: Self-pay

## 2020-11-03 ENCOUNTER — Other Ambulatory Visit: Payer: Self-pay | Admitting: Obstetrics and Gynecology

## 2020-11-03 DIAGNOSIS — B3731 Acute candidiasis of vulva and vagina: Secondary | ICD-10-CM

## 2020-11-03 DIAGNOSIS — B373 Candidiasis of vulva and vagina: Secondary | ICD-10-CM

## 2020-11-03 MED ORDER — TERCONAZOLE 0.4 % VA CREA: 1.0000 | TOPICAL_CREAM | Freq: Every day | VAGINAL | 0 refills | Status: DC

## 2020-11-03 MED ORDER — METRONIDAZOLE 500 MG PO TABS: 500.0000 mg | ORAL_TABLET | Freq: Two times a day (BID) | ORAL | 0 refills | Status: DC

## 2020-11-03 NOTE — Telephone Encounter (Signed)
Patient has BV and yeast. Will treat per protocols.

## 2020-11-06 ENCOUNTER — Other Ambulatory Visit: Payer: Self-pay | Admitting: Obstetrics & Gynecology

## 2020-11-17 ENCOUNTER — Ambulatory Visit (INDEPENDENT_AMBULATORY_CARE_PROVIDER_SITE_OTHER): Payer: BC Managed Care – PPO | Admitting: Internal Medicine

## 2020-11-17 ENCOUNTER — Encounter: Payer: Self-pay | Admitting: Internal Medicine

## 2020-11-17 ENCOUNTER — Other Ambulatory Visit: Payer: Self-pay

## 2020-11-17 VITALS — BP 110/60 | HR 96 | Ht 66.0 in | Wt 190.0 lb

## 2020-11-17 DIAGNOSIS — O99280 Endocrine, nutritional and metabolic diseases complicating pregnancy, unspecified trimester: Secondary | ICD-10-CM

## 2020-11-17 DIAGNOSIS — E0590 Thyrotoxicosis, unspecified without thyrotoxic crisis or storm: Secondary | ICD-10-CM

## 2020-11-17 LAB — TSH: TSH: 0.96 u[IU]/mL (ref 0.35–5.50)

## 2020-11-17 NOTE — Progress Notes (Signed)
Name: [name]  MRN/ DOB: [id number], December 06, 1985    Age/ Sex: 35 y.o., female     PCP: Kathryn Essex, MD   Reason for Endocrinology Evaluation: Hyperthyroidism     Initial Endocrinology Clinic Visit: 07/21/2020    PATIENT IDENTIFIER: 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 She has followed with [facility] since 07/21/2020 for consultative assistance with management of her Hyperthyroidism .   HISTORICAL SUMMARY:   She was noted to have hyperthyroidism during routine labs on 07/09/2020 with a  TSH of 0.03 uIU/mL and elevated T3 and T4 at 200 ng/dl and 15.1 ug/dL respectively, she was pregnant at the time  She was started on PTU at ~ 12 weeks of gestation due to a total T4 of 17.9 mcg/dl and suppressed TSH. 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 is here for Hyperthyroidism  She is currently 29.6 weeks of gestation  She has been noted with weight gain  She denies diarrhea or constipation  Has been having constipation that she attributes to iron supplements.  Denies palpitations  Denies tremors  Denies local neck symptoms   EDD 10/25th     HISTORY:  Past Medical History:  Past Medical History:  Diagnosis Date   Anemia    Fibroid    Fibroids    Past Surgical History:  Past Surgical History:  Procedure Laterality Date   CESAREAN SECTION     LAPAROSCOPIC GELPORT ASSISTED MYOMECTOMY N/A 07/04/2019   Procedure: LAPAROSCOPIC GELPORT ASSISTED MYOMECTOMY ABLATION OF ENDOMETRIOSIS  TRANSMYOMETRIAL HYSTEROSCOPY LYSIS OF ADHESIONS AND CHROMOTUBATION;  Surgeon: Kathryn Specking, MD;  [facility]  Service: Gynecology;  Laterality: N/A;  [name] RNFA confirmed on 06/18/19 CS   Social History:  reports that she has never smoked. She has never used  smokeless tobacco. She reports current alcohol use. She reports that she does not use drugs. Family History: No family history on file.   HOME MEDICATIONS: Allergies as of 11/17/2020   No Known Allergies      Medication List        Accurate as of November 17, 2020  8:28 AM. If you have any questions, ask your nurse or doctor.          STOP taking these medications    metroNIDAZOLE 500 MG tablet Commonly known as: FLAGYL Stopped by: Kathryn Sciara, MD       TAKE these medications    acetaminophen 500 MG tablet Commonly known as: TYLENOL Take 500 mg by mouth every 6 (six) hours as needed for moderate pain.   aspirin EC 81 MG tablet Take 1 tablet (81 mg total) by mouth daily. Take after 12 weeks for prevention of preeclampsia later in pregnancy   ferrous sulfate 325 (65 FE) MG EC tablet Take 1 tablet (325 mg total) by mouth daily.   prenatal vitamin w/FE, FA 27-1 MG Tabs tablet Take 1 tablet by mouth daily at 12 noon.   terconazole 0.4 % vaginal cream Commonly known as: TERAZOL 7 Place 1 applicator vaginally at bedtime.          OBJECTIVE:   PHYSICAL EXAM: VS: BP 110/60   Pulse 96   Ht '5\' 6"'$  (1.676 m)   Wt 190 lb (86.2 kg)   LMP 04/29/2020 (  Exact Date)   SpO2 97%   BMI 30.67 kg/m    EXAM: General: Pt appears well and is in NAD  Neck: General: Supple without adenopathy. Thyroid: Thyroid size normal.  No goiter or nodules appreciated. No thyroid bruit.  Lungs: Clear with good BS bilat with no rales, rhonchi, or wheezes  Heart: Auscultation: RRR.  Abdomen: Normoactive bowel sounds, soft, nontender, without masses or organomegaly palpable  Extremities:  BL LE: No pretibial edema normal ROM and strength.  Mental Status: Judgment, insight: Intact Orientation: Oriented to time, place, and person Mood and affect: No depression, anxiety, or agitation     DATA REVIEWED:  Results for Kathryn, Robles (MRN [id number]) as of 11/17/2020 13:44   Ref. Range 11/17/2020 08:28  TSH Latest Ref Range: 0.35 - 5.50 uIU/mL 0.96     ASSESSMENT / PLAN / RECOMMENDATIONS:   Hyperthyroidism during pregnancy :  - She is clinically euthyroid  - Has been off PTU since 09/2020 with normal TFT's in 10/2020 - She is planning on C-Section around 37 weeks of gestation  - TSH normal, T4 and T 3 pending    F/U in 3 months Labs in 4 weeks    Signed electronically by: Kathryn Guise, MD  [facility]  [address] [place], [place] [address] Phone: [phone] FAX: [phone]      CC: Kathryn Essex, MD [place] [address] Phone: [phone]  Fax: [phone]   Return to Endocrinology clinic as below: Future Appointments  Date Time Provider [place]  11/20/2020  9:55 AM Shelly Bombard, MD [facility] None  11/27/2020  7:45 AM [facility] NURSE [facility]  11/27/2020  8:00 AM [facility] US1 [facility]

## 2020-11-19 ENCOUNTER — Telehealth: Payer: Self-pay | Admitting: *Deleted

## 2020-11-19 ENCOUNTER — Encounter: Payer: Self-pay | Admitting: *Deleted

## 2020-11-19 NOTE — Telephone Encounter (Signed)
Call to patient. Notified c-section scheduled for 01-07-21 at 0930 and arrival at 0730. Advised will receive letter in mail and pre-op call with additional instructions.  Encounter closed.

## 2020-11-20 ENCOUNTER — Other Ambulatory Visit: Payer: Self-pay

## 2020-11-20 ENCOUNTER — Encounter: Payer: Self-pay | Admitting: Obstetrics

## 2020-11-20 ENCOUNTER — Ambulatory Visit (INDEPENDENT_AMBULATORY_CARE_PROVIDER_SITE_OTHER): Payer: BC Managed Care – PPO | Admitting: Obstetrics

## 2020-11-20 VITALS — BP 113/64 | HR 94 | Wt 190.0 lb

## 2020-11-20 DIAGNOSIS — O34219 Maternal care for unspecified type scar from previous cesarean delivery: Secondary | ICD-10-CM

## 2020-11-20 DIAGNOSIS — O09529 Supervision of elderly multigravida, unspecified trimester: Secondary | ICD-10-CM

## 2020-11-20 DIAGNOSIS — O0990 Supervision of high risk pregnancy, unspecified, unspecified trimester: Secondary | ICD-10-CM

## 2020-11-20 LAB — T4: T4, Total: 10.9 ug/dL (ref 5.1–11.9)

## 2020-11-20 LAB — T3: T3, Total: 184 ng/dL — ABNORMAL HIGH (ref 76–181)

## 2020-11-20 LAB — THYROID STIMULATING IMMUNOGLOBULIN: TSI: <89 % baseline (ref <140)

## 2020-11-20 NOTE — Progress Notes (Signed)
Pt states she is having some swelling in LE.

## 2020-11-20 NOTE — Progress Notes (Signed)
Subjective:  [name] is a 35 y.o. G2P1001 at 34w2dbeing seen today for ongoing prenatal care.  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  Patient reports backache and swelling of feet .  Contractions: Not present. Vag. Bleeding: None.  Movement: Present. Denies leaking of fluid.   The following portions of the patient's history were reviewed and updated as appropriate: allergies, current medications, past family history, past medical history, past social history, past surgical history and problem list. Problem list updated.  Objective:   Vitals:   11/20/20 0953  BP: 113/64  Pulse: 94  Weight: 190 lb (86.2 kg)    Fetal Status:     Movement: Present     General:  Alert, oriented and cooperative. Patient is in no acute distress.  Skin: Skin is warm and dry. No rash noted.   Cardiovascular: Normal heart rate noted  Respiratory: Normal respiratory effort, no problems with respiration noted  Abdomen: Soft, gravid, appropriate for gestational age. Pain/Pressure: Absent     Pelvic:  Cervical exam deferred        Extremities: Normal range of motion.  Edema: Trace  Mental Status: Normal mood and affect. Normal behavior. Normal judgment and thought content.   Urinalysis:      Assessment and Plan:  Pregnancy: G2P1001 at 33w2d1. Supervision of high risk pregnancy, antepartum  2. Previous cesarean delivery affecting pregnancy, antepartum - repeat C/S scheduled at 37 weeks because of type of myomectomy by REI  3. Antepartum multigravida of advanced  maternal age   Preterm labor symptoms and general obstetric precautions including but not limited to vaginal bleeding, contractions, leaking of fluid and fetal movement were reviewed in detail with the patient. Please refer to After Visit Summary for other counseling recommendations.   Return in about 2 weeks (around 12/04/2020) [facility]  HaShelly BombardMD  11/20/20

## 2020-11-21 ENCOUNTER — Ambulatory Visit: Payer: BC Managed Care – PPO | Admitting: Internal Medicine

## 2020-11-21 ENCOUNTER — Other Ambulatory Visit: Payer: Self-pay | Admitting: Obstetrics and Gynecology

## 2020-11-27 ENCOUNTER — Encounter: Payer: Self-pay | Admitting: *Deleted

## 2020-11-27 ENCOUNTER — Other Ambulatory Visit: Payer: Self-pay | Admitting: Obstetrics

## 2020-11-27 ENCOUNTER — Ambulatory Visit: Payer: BC Managed Care – PPO | Admitting: *Deleted

## 2020-11-27 ENCOUNTER — Other Ambulatory Visit: Payer: Self-pay

## 2020-11-27 ENCOUNTER — Ambulatory Visit: Payer: BC Managed Care – PPO | Attending: Obstetrics and Gynecology

## 2020-11-27 VITALS — BP 102/62 | HR 88

## 2020-11-27 DIAGNOSIS — Z148 Genetic carrier of other disease: Secondary | ICD-10-CM

## 2020-11-27 DIAGNOSIS — O34219 Maternal care for unspecified type scar from previous cesarean delivery: Secondary | ICD-10-CM

## 2020-11-27 DIAGNOSIS — O09293 Supervision of pregnancy with other poor reproductive or obstetric history, third trimester: Secondary | ICD-10-CM

## 2020-11-27 DIAGNOSIS — O0990 Supervision of high risk pregnancy, unspecified, unspecified trimester: Secondary | ICD-10-CM | POA: Diagnosis present

## 2020-11-27 DIAGNOSIS — O99283 Endocrine, nutritional and metabolic diseases complicating pregnancy, third trimester: Secondary | ICD-10-CM

## 2020-11-27 DIAGNOSIS — E079 Disorder of thyroid, unspecified: Secondary | ICD-10-CM

## 2020-11-27 DIAGNOSIS — O09522 Supervision of elderly multigravida, second trimester: Secondary | ICD-10-CM | POA: Insufficient documentation

## 2020-11-27 DIAGNOSIS — Z363 Encounter for antenatal screening for malformations: Secondary | ICD-10-CM | POA: Diagnosis not present

## 2020-11-27 DIAGNOSIS — O99280 Endocrine, nutritional and metabolic diseases complicating pregnancy, unspecified trimester: Secondary | ICD-10-CM

## 2020-11-27 DIAGNOSIS — E0590 Thyrotoxicosis, unspecified without thyrotoxic crisis or storm: Secondary | ICD-10-CM | POA: Diagnosis not present

## 2020-11-27 DIAGNOSIS — O09523 Supervision of elderly multigravida, third trimester: Secondary | ICD-10-CM

## 2020-11-27 DIAGNOSIS — Z3A31 31 weeks gestation of pregnancy: Secondary | ICD-10-CM

## 2020-12-04 ENCOUNTER — Ambulatory Visit (INDEPENDENT_AMBULATORY_CARE_PROVIDER_SITE_OTHER): Payer: BC Managed Care – PPO | Admitting: Obstetrics and Gynecology

## 2020-12-04 ENCOUNTER — Other Ambulatory Visit: Payer: Self-pay

## 2020-12-04 ENCOUNTER — Encounter: Payer: Self-pay | Admitting: Obstetrics and Gynecology

## 2020-12-04 VITALS — BP 109/66 | HR 97 | Wt 189.6 lb

## 2020-12-04 DIAGNOSIS — O34219 Maternal care for unspecified type scar from previous cesarean delivery: Secondary | ICD-10-CM

## 2020-12-04 DIAGNOSIS — O09 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, unspecified trimester: Secondary | ICD-10-CM

## 2020-12-04 DIAGNOSIS — O09529 Supervision of elderly multigravida, unspecified trimester: Secondary | ICD-10-CM

## 2020-12-04 DIAGNOSIS — O99280 Endocrine, nutritional and metabolic diseases complicating pregnancy, unspecified trimester: Secondary | ICD-10-CM

## 2020-12-04 DIAGNOSIS — E0590 Thyrotoxicosis, unspecified without thyrotoxic crisis or storm: Secondary | ICD-10-CM

## 2020-12-04 NOTE — Progress Notes (Signed)
Pt reports fetal movement, denies pain.  

## 2020-12-04 NOTE — Progress Notes (Signed)
   PRENATAL VISIT NOTE  Subjective:  [name] is a 35 y.o. G2P1001 at 46w2dbeing seen today for ongoing prenatal care.  She is currently monitored for the following issues for this high-risk pregnancy and has Cystic fibrosis carrier; Fibroids; 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; Supervision of high risk pregnancy, antepartum; History of prior pregnancy with short cervix, currently pregnant; AMA (advanced maternal age) multigravida 362+ Hyperthyroidism during pregnancy; Microcytic anemia; and Previous cesarean delivery affecting pregnancy, antepartum on their problem list.  Patient reports no complaints.  Contractions: Not present. Vag. Bleeding: None.  Movement: Present. Denies leaking of fluid.   The following portions of the patient's history were reviewed and updated as appropriate: allergies, current medications, past family history, past medical history, past social history, past surgical history and problem list.   Objective:   Vitals:   12/04/20 1054  BP: 109/66  Pulse: 97  Weight: 189 lb 9.6 oz (86 kg)    Fetal Status: Fetal Heart Rate (bpm): 141 Fundal Height: 32 cm Movement: Present     General:  Alert, oriented and cooperative. Patient is in no acute distress.  Skin: Skin is warm and dry. No rash noted.   Cardiovascular: Normal heart rate noted  Respiratory: Normal respiratory effort, no problems with respiration noted  Abdomen: Soft, gravid, appropriate for gestational age.  Pain/Pressure: Absent     Pelvic: Cervical exam deferred        Extremities: Normal range of motion.  Edema: Trace  Mental Status: Normal mood and affect. Normal behavior. Normal judgment and thought content.   Assessment and Plan:  Pregnancy: G2P1001 at 328w2d. 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 Previous cesarean delivery affecting pregnancy, antepartum Scheduled for repeat at 37 weeks due to previous  myomectomy Patient does not want contraception  3. Antepartum multigravida of advanced maternal age   4.62Hyperthyroidism during pregnancy Stable without medication Follow up growth ultrasound  Preterm labor symptoms and general obstetric precautions including but not limited to vaginal bleeding, contractions, leaking of fluid and fetal movement were reviewed in detail with the patient. Please refer to After Visit Summary for other counseling recommendations.   Return in about 2 weeks (around 12/18/2020) for in person, ROB, High risk.  Future Appointments  Date Time Provider [place]9/03/2021  9:00 AM [facility] LAB [facility] None  12/25/2020  8:30 AM [facility]  8:45 AM [facility]/14/2022 10:30 AM Shamleffer, IbMelanie CrazierMD [facility] None    PeMora BellmanMD

## 2020-12-11 ENCOUNTER — Telehealth: Payer: Self-pay | Admitting: *Deleted

## 2020-12-11 NOTE — Telephone Encounter (Signed)
Pt called to office about FMLA forms. Pt made aware that forms have been completed and scanned to chart.  Pt made aware may pick up anytime. Pt advised there is a $15 charge for forms that will need to be paid if not already.

## 2020-12-15 ENCOUNTER — Other Ambulatory Visit (INDEPENDENT_AMBULATORY_CARE_PROVIDER_SITE_OTHER): Payer: BC Managed Care – PPO

## 2020-12-15 ENCOUNTER — Other Ambulatory Visit: Payer: Self-pay

## 2020-12-15 DIAGNOSIS — E0590 Thyrotoxicosis, unspecified without thyrotoxic crisis or storm: Secondary | ICD-10-CM | POA: Diagnosis not present

## 2020-12-15 DIAGNOSIS — O99280 Endocrine, nutritional and metabolic diseases complicating pregnancy, unspecified trimester: Secondary | ICD-10-CM | POA: Diagnosis not present

## 2020-12-15 LAB — T3: T3, Total: 202 ng/dL — ABNORMAL HIGH (ref 76–181)

## 2020-12-15 LAB — TSH: TSH: 0.94 u[IU]/mL (ref 0.35–5.50)

## 2020-12-15 LAB — T4: T4, Total: 11.1 ug/dL (ref 5.1–11.9)

## 2020-12-18 ENCOUNTER — Encounter: Payer: BC Managed Care – PPO | Admitting: Obstetrics & Gynecology

## 2020-12-23 NOTE — Patient Instructions (Signed)
[name]  12/23/2020   Your procedure is scheduled on:  01/07/2021  Arrive at 0830 at [employer] on [address] at [facility]  and [address]. You are invited to use the FREE valet parking or use the Visitor's parking deck.  Pick up the phone at the desk and dial [phone].  Call this number if you have problems the morning of surgery: [phone]  Remember:   Do not eat food:(After Midnight) Desps de medianoche.  Do not drink clear liquids: (After Midnight) Desps de medianoche.  Take these medicines the morning of surgery with A SIP OF WATER:  none   Do not wear jewelry, make-up or nail polish.  Do not wear lotions, powders, or perfumes. Do not wear deodorant.  Do not shave 48 hours prior to surgery.  Do not bring valuables to the hospital.  [facility] is not   responsible for any belongings or valuables brought to the hospital.  Contacts, dentures or bridgework may not be worn into surgery.  Leave suitcase in the car. After surgery it may be brought to your room.  For patients admitted to the hospital, checkout time is 11:00 AM the day of              discharge.      Please read over the following fact sheets that you were given:     Preparing for Surgery

## 2020-12-25 ENCOUNTER — Other Ambulatory Visit: Payer: Self-pay

## 2020-12-25 ENCOUNTER — Ambulatory Visit: Payer: BC Managed Care – PPO | Attending: Obstetrics

## 2020-12-25 ENCOUNTER — Ambulatory Visit: Payer: BC Managed Care – PPO | Admitting: *Deleted

## 2020-12-25 VITALS — BP 119/66 | HR 89

## 2020-12-25 DIAGNOSIS — O09293 Supervision of pregnancy with other poor reproductive or obstetric history, third trimester: Secondary | ICD-10-CM

## 2020-12-25 DIAGNOSIS — O34219 Maternal care for unspecified type scar from previous cesarean delivery: Secondary | ICD-10-CM

## 2020-12-25 DIAGNOSIS — O99283 Endocrine, nutritional and metabolic diseases complicating pregnancy, third trimester: Secondary | ICD-10-CM

## 2020-12-25 DIAGNOSIS — O0990 Supervision of high risk pregnancy, unspecified, unspecified trimester: Secondary | ICD-10-CM

## 2020-12-25 DIAGNOSIS — E079 Disorder of thyroid, unspecified: Secondary | ICD-10-CM | POA: Insufficient documentation

## 2020-12-25 DIAGNOSIS — O99280 Endocrine, nutritional and metabolic diseases complicating pregnancy, unspecified trimester: Secondary | ICD-10-CM | POA: Diagnosis not present

## 2020-12-25 DIAGNOSIS — Z148 Genetic carrier of other disease: Secondary | ICD-10-CM | POA: Insufficient documentation

## 2020-12-25 DIAGNOSIS — Z3A35 35 weeks gestation of pregnancy: Secondary | ICD-10-CM

## 2020-12-26 ENCOUNTER — Encounter (HOSPITAL_COMMUNITY): Payer: Self-pay

## 2021-01-05 ENCOUNTER — Encounter (HOSPITAL_COMMUNITY)
Admission: RE | Admit: 2021-01-05 | Discharge: 2021-01-05 | Disposition: A | Discharge status: Home / Self Care | Payer: BC Managed Care – PPO | Source: Ambulatory Visit | Attending: Obstetrics and Gynecology | Admitting: Obstetrics and Gynecology

## 2021-01-05 ENCOUNTER — Other Ambulatory Visit: Payer: Self-pay

## 2021-01-05 ENCOUNTER — Other Ambulatory Visit: Payer: Self-pay | Admitting: Obstetrics and Gynecology

## 2021-01-05 DIAGNOSIS — Z01812 Encounter for preprocedural laboratory examination: Secondary | ICD-10-CM | POA: Insufficient documentation

## 2021-01-05 LAB — CBC
HCT: 28.1 % — ABNORMAL LOW (ref 36.0–46.0)
Hemoglobin: 9 g/dL — ABNORMAL LOW (ref 12.0–15.0)
MCH: 26.7 pg (ref 26.0–34.0)
MCHC: 32 g/dL (ref 30.0–36.0)
MCV: 83.4 fL (ref 80.0–100.0)
Platelets: 166 10*3/uL (ref 150–400)
RBC: 3.37 MIL/uL — ABNORMAL LOW (ref 3.87–5.11)
RDW: 14.6 % (ref 11.5–15.5)
WBC: 8.3 10*3/uL (ref 4.0–10.5)
nRBC: 0 % (ref 0.0–0.2)

## 2021-01-06 LAB — RPR: RPR Ser Ql: NONREACTIVE

## 2021-01-06 LAB — SARS CORONAVIRUS 2 (TAT 6-24 HRS): SARS Coronavirus 2: NEGATIVE

## 2021-01-06 NOTE — H&P (Signed)
OBSTETRIC ADMISSION HISTORY AND PHYSICAL  [name] is a 35 y.o. female G2P1001 with IUP at [redacted]w[redacted]d by early [place] presenting for scheduled cesarean section. She reports +FMs, no LOF, no VB, no blurry vision, headaches, peripheral edema, or RUQ pain.  She plans on breast feeding. She is undecided about birth control postpartum.   She received her prenatal care at [facility].   Dating: By [place] --->  Estimated Date of Delivery: 01/27/21  Sono:   @[redacted]w[redacted]d , normal anatomy, cephalic presentation, anterior placental lie, 2705g, 56% EFW  Prenatal History/Complications:  AMA Hx of CS Hx of myomectomy Hx of infertility  Hx prior pregnancy with short cervix  Increased risk for SMA CF carrier Hyperthyroidism   Past Medical History: Past Medical History:  Diagnosis Date   Anemia    Fibroid    Fibroids     Past Surgical History: Past Surgical History:  Procedure Laterality Date   CESAREAN SECTION     LAPAROSCOPIC GELPORT ASSISTED MYOMECTOMY N/A 07/04/2019   Procedure: LAPAROSCOPIC GELPORT ASSISTED MYOMECTOMY ABLATION OF ENDOMETRIOSIS  TRANSMYOMETRIAL HYSTEROSCOPY LYSIS OF ADHESIONS AND CHROMOTUBATION;  Surgeon: Governor Specking, MD;  Location: [facility];  Service: Gynecology;  Laterality: N/A;  [name] confirmed on 06/18/19 CS    Obstetrical History: OB History     Gravida  2   Para  1   Term  1   Preterm      AB      Living  1      SAB      IAB      Ectopic      Multiple      Live Births  1           Social History Social History   Socioeconomic History   Marital status: Married    Spouse name: Not on file   Number of children: Not on file   Years of education: Not on file   Highest education level: Not on file  Occupational History   Not on file  Tobacco Use   Smoking status: Never   Smokeless tobacco: Never  Vaping Use   Vaping Use: Never used  Substance and Sexual Activity   Alcohol use: Yes    Comment: social prior  to pregnancy   Drug use: No   Sexual activity: Yes    Partners: Male    Birth control/protection: None  Other Topics Concern   Not on file  Social History Narrative   ** Merged History Encounter **       Social Determinants of Health   Financial Resource Strain: Not on file  Food Insecurity: Not on file  Transportation Needs: Not on file  Physical Activity: Not on file  Stress: Not on file  Social Connections: Not on file    Family History: History reviewed. No pertinent family history.  Allergies: No Known Allergies  Medications Prior to Admission  Medication Sig Dispense Refill Last Dose   acetaminophen (TYLENOL) 500 MG tablet Take 500 mg by mouth every 6 (six) hours as needed for moderate pain.      aspirin EC 81 MG tablet Take 1 tablet (81 mg total) by mouth daily. Take after 12 weeks for prevention of preeclampsia later in pregnancy 300 tablet 2 01/06/2021   ferrous sulfate 325 (65 FE) MG EC tablet Take 1 tablet (325 mg total) by mouth daily. 90 tablet 3 01/06/2021   terconazole (TERAZOL 7) 0.4 % vaginal cream Place 1 applicator vaginally at bedtime. (  Patient not taking: No sig reported) 45 g 0 Not Taking     Review of Systems  All systems reviewed and negative except as stated in HPI  Blood pressure 111/65, pulse 81, temperature 97.8 F (36.6 C), temperature source Oral, resp. rate 16, height 5\' 6"  (1.676 m), weight 86.2 kg, last menstrual period 04/29/2020, SpO2 100 %, unknown if currently breastfeeding.  General appearance: alert, cooperative, and no distress Lungs: normal work of breathing on room air  Heart: normal rate, warm and well perfused  Abdomen: soft, non-tender, gravid Extremities: no LE edema, SCDs in place  Prenatal labs: ABO, Rh: --/--/O POS (10/03 1139) Antibody: NEG (10/03 1139) Rubella: 9.46 (03/10 1030) RPR: NON REACTIVE (10/03 1132)  HBsAg: Negative (03/10 1030)  HIV: Non Reactive (07/28 1001)  GBS:  Unknown   2 hr Glucola normal   Genetic screening - CF carrier, increased carrier risk for SMA Anatomy US normal  Prenatal Transfer Tool  Maternal Diabetes: No Genetic Screening: CF carrier, increased carrier risk for SMA Maternal Ultrasounds/Referrals: Normal Fetal Ultrasounds or other Referrals:  Referred to Materal Fetal Medicine  Maternal Substance Abuse:  No Significant Maternal Medications:  None Significant Maternal Lab Results: None  Results for orders placed or performed during the hospital encounter of 01/07/21 (from the past 24 hour(s))  Prepare RBC (crossmatch)   Collection Time: 01/07/21  8:52 AM  Result Value Ref Range   Order Confirmation      ORDER PROCESSED BY BLOOD BANK Performed at [facility], [address], [place], [place] [address]     Patient Active Problem List   Diagnosis Date Noted   Previous cesarean delivery affecting pregnancy, antepartum 08/06/2020   Microcytic anemia 07/22/2020   Hyperthyroidism during pregnancy 07/21/2020   History of prior pregnancy with short cervix, currently pregnant 07/09/2020   AMA (advanced maternal age) multigravida 35+ 07/09/2020   Supervision of high risk pregnancy, antepartum 07/04/2020   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 06/12/2020   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   Fibroids 05/03/2013    Assessment/Plan:  [name] is a 35 y.o. G2P1001 at [redacted]w[redacted]d here for scheduled cesarean section.   The risks of surgery were discussed with the patient including but were not limited to: bleeding which may require transfusion or reoperation; infection which may require antibiotics; injury to bowel, bladder, ureters or other surrounding organs; injury to the fetus; need for additional procedures including hysterectomy in the event of a life-threatening hemorrhage; formation of adhesions; placental abnormalities with subsequent pregnancies; incisional problems; thromboembolic phenomenon and other postoperative/anesthesia  complications.  The patient concurred with the proposed plan, giving informed written consent for the procedure.   Patient has been NPO since 10 pm last evening and she will remain NPO for procedure. Anesthesia and OR aware. Preoperative prophylactic antibiotics and SCDs ordered on call to the OR.  To OR when ready.  #Pain: Per anesthesia  #ID:  GBS unknown; pre-operative antibiotics ordered  #MOF: Breast #MOC: Unsure #Circ:  Desires   Genia Del, MD  01/07/2021, 9:19 AM

## 2021-01-07 ENCOUNTER — Other Ambulatory Visit: Payer: Self-pay

## 2021-01-07 ENCOUNTER — Encounter (HOSPITAL_COMMUNITY)
Admission: RE | Disposition: A | Discharge status: Home / Self Care | Payer: Self-pay | Source: Home / Self Care | Attending: Obstetrics and Gynecology

## 2021-01-07 ENCOUNTER — Inpatient Hospital Stay (HOSPITAL_COMMUNITY): Payer: BC Managed Care – PPO | Admitting: Certified Registered"

## 2021-01-07 ENCOUNTER — Encounter (HOSPITAL_COMMUNITY): Payer: Self-pay | Admitting: Obstetrics and Gynecology

## 2021-01-07 ENCOUNTER — Inpatient Hospital Stay (HOSPITAL_COMMUNITY)
Admission: RE | Admit: 2021-01-07 | Discharge: 2021-01-10 | DRG: 787 | Disposition: A | Discharge status: Home / Self Care | Payer: BC Managed Care – PPO | Attending: Obstetrics and Gynecology | Admitting: Obstetrics and Gynecology

## 2021-01-07 DIAGNOSIS — O34211 Maternal care for low transverse scar from previous cesarean delivery: Principal | ICD-10-CM | POA: Diagnosis present

## 2021-01-07 DIAGNOSIS — O321XX0 Maternal care for breech presentation, not applicable or unspecified: Secondary | ICD-10-CM | POA: Diagnosis present

## 2021-01-07 DIAGNOSIS — D259 Leiomyoma of uterus, unspecified: Secondary | ICD-10-CM | POA: Diagnosis present

## 2021-01-07 DIAGNOSIS — Z141 Cystic fibrosis carrier: Secondary | ICD-10-CM

## 2021-01-07 DIAGNOSIS — O3413 Maternal care for benign tumor of corpus uteri, third trimester: Secondary | ICD-10-CM | POA: Diagnosis present

## 2021-01-07 DIAGNOSIS — D219 Benign neoplasm of connective and other soft tissue, unspecified: Secondary | ICD-10-CM | POA: Diagnosis present

## 2021-01-07 DIAGNOSIS — R140 Abdominal distension (gaseous): Secondary | ICD-10-CM | POA: Diagnosis not present

## 2021-01-07 DIAGNOSIS — O99280 Endocrine, nutritional and metabolic diseases complicating pregnancy, unspecified trimester: Secondary | ICD-10-CM | POA: Diagnosis present

## 2021-01-07 DIAGNOSIS — K5900 Constipation, unspecified: Secondary | ICD-10-CM | POA: Diagnosis not present

## 2021-01-07 DIAGNOSIS — O9081 Anemia of the puerperium: Secondary | ICD-10-CM | POA: Diagnosis not present

## 2021-01-07 DIAGNOSIS — E0590 Thyrotoxicosis, unspecified without thyrotoxic crisis or storm: Secondary | ICD-10-CM | POA: Diagnosis present

## 2021-01-07 DIAGNOSIS — Z3A37 37 weeks gestation of pregnancy: Secondary | ICD-10-CM | POA: Diagnosis not present

## 2021-01-07 DIAGNOSIS — D62 Acute posthemorrhagic anemia: Secondary | ICD-10-CM | POA: Diagnosis not present

## 2021-01-07 DIAGNOSIS — O99893 Other specified diseases and conditions complicating puerperium: Secondary | ICD-10-CM | POA: Diagnosis not present

## 2021-01-07 DIAGNOSIS — Z98891 History of uterine scar from previous surgery: Secondary | ICD-10-CM

## 2021-01-07 DIAGNOSIS — D509 Iron deficiency anemia, unspecified: Secondary | ICD-10-CM | POA: Diagnosis present

## 2021-01-07 DIAGNOSIS — O09529 Supervision of elderly multigravida, unspecified trimester: Secondary | ICD-10-CM

## 2021-01-07 DIAGNOSIS — Z9889 Other specified postprocedural states: Secondary | ICD-10-CM

## 2021-01-07 LAB — PREPARE RBC (CROSSMATCH)

## 2021-01-07 SURGERY — Surgical Case
Anesthesia: Spinal

## 2021-01-07 MED ORDER — SODIUM CHLORIDE 0.9 % IR SOLN
Status: DC | PRN
  Administered 2021-01-07: 1

## 2021-01-07 MED ORDER — ONDANSETRON HCL 4 MG/2ML IJ SOLN
INTRAMUSCULAR | Status: DC | PRN
  Administered 2021-01-07: 4 mg via INTRAVENOUS

## 2021-01-07 MED ORDER — IBUPROFEN 600 MG PO TABS
600.0000 mg | ORAL_TABLET | Freq: Four times a day (QID) | ORAL | Status: DC
  Administered 2021-01-10: 600 mg via ORAL
  Filled 2021-01-07 (×4): qty 1

## 2021-01-07 MED ORDER — OXYTOCIN-SODIUM CHLORIDE 30-0.9 UT/500ML-% IV SOLN
INTRAVENOUS | Status: DC | PRN
  Administered 2021-01-07: 200 mL via INTRAVENOUS

## 2021-01-07 MED ORDER — MORPHINE SULFATE (PF) 0.5 MG/ML IJ SOLN
INTRAMUSCULAR | Status: AC
  Filled 2021-01-07: qty 10

## 2021-01-07 MED ORDER — WITCH HAZEL-GLYCERIN EX PADS: 1.0000 "application " | MEDICATED_PAD | CUTANEOUS | Status: DC | PRN

## 2021-01-07 MED ORDER — LACTATED RINGERS IV SOLN: INTRAVENOUS | Status: DC

## 2021-01-07 MED ORDER — SCOPOLAMINE 1 MG/3DAYS TD PT72
MEDICATED_PATCH | TRANSDERMAL | Status: AC
  Filled 2021-01-07: qty 1

## 2021-01-07 MED ORDER — SODIUM CHLORIDE 0.9% FLUSH: 3.0000 mL | INTRAVENOUS | Status: DC | PRN

## 2021-01-07 MED ORDER — FENTANYL CITRATE (PF) 100 MCG/2ML IJ SOLN
INTRAMUSCULAR | Status: DC | PRN
  Administered 2021-01-07: 15 ug via INTRATHECAL

## 2021-01-07 MED ORDER — KETOROLAC TROMETHAMINE 30 MG/ML IJ SOLN
30.0000 mg | Freq: Four times a day (QID) | INTRAMUSCULAR | Status: AC | PRN
  Administered 2021-01-07: 30 mg via INTRAMUSCULAR

## 2021-01-07 MED ORDER — NALOXONE HCL 0.4 MG/ML IJ SOLN: 0.4000 mg | INTRAMUSCULAR | Status: DC | PRN

## 2021-01-07 MED ORDER — DROPERIDOL 2.5 MG/ML IJ SOLN: 0.6250 mg | Freq: Once | INTRAMUSCULAR | Status: DC | PRN

## 2021-01-07 MED ORDER — PRENATAL MULTIVITAMIN CH
1.0000 | ORAL_TABLET | Freq: Every day | ORAL | Status: DC
  Administered 2021-01-08 – 2021-01-09 (×2): 1 via ORAL
  Filled 2021-01-07 (×2): qty 1

## 2021-01-07 MED ORDER — DIPHENHYDRAMINE HCL 25 MG PO CAPS: 25.0000 mg | ORAL_CAPSULE | ORAL | Status: DC | PRN

## 2021-01-07 MED ORDER — TRANEXAMIC ACID-NACL 1000-0.7 MG/100ML-% IV SOLN
INTRAVENOUS | Status: DC | PRN
  Administered 2021-01-07: 1000 mg via INTRAVENOUS

## 2021-01-07 MED ORDER — ONDANSETRON HCL 4 MG/2ML IJ SOLN: 4.0000 mg | Freq: Three times a day (TID) | INTRAMUSCULAR | Status: DC | PRN

## 2021-01-07 MED ORDER — LACTATED RINGERS IV SOLN: INTRAVENOUS | Status: DC | PRN

## 2021-01-07 MED ORDER — MENTHOL 3 MG MT LOZG: 1.0000 | LOZENGE | OROMUCOSAL | Status: DC | PRN

## 2021-01-07 MED ORDER — DIPHENHYDRAMINE HCL 25 MG PO CAPS: 25.0000 mg | ORAL_CAPSULE | Freq: Four times a day (QID) | ORAL | Status: DC | PRN

## 2021-01-07 MED ORDER — MEASLES, MUMPS & RUBELLA VAC IJ SOLR: 0.5000 mL | Freq: Once | INTRAMUSCULAR | Status: DC

## 2021-01-07 MED ORDER — SENNOSIDES-DOCUSATE SODIUM 8.6-50 MG PO TABS
2.0000 | ORAL_TABLET | ORAL | Status: DC
  Administered 2021-01-08: 2 via ORAL
  Filled 2021-01-07: qty 2

## 2021-01-07 MED ORDER — KETOROLAC TROMETHAMINE 30 MG/ML IJ SOLN
30.0000 mg | Freq: Four times a day (QID) | INTRAMUSCULAR | Status: AC | PRN
Start: 2021-01-07 — End: 2021-01-08
  Administered 2021-01-07 – 2021-01-08 (×2): 30 mg via INTRAVENOUS
  Filled 2021-01-07 (×2): qty 1

## 2021-01-07 MED ORDER — PROMETHAZINE HCL 25 MG/ML IJ SOLN: 6.2500 mg | INTRAMUSCULAR | Status: DC | PRN

## 2021-01-07 MED ORDER — DEXAMETHASONE SODIUM PHOSPHATE 4 MG/ML IJ SOLN
INTRAMUSCULAR | Status: DC | PRN
  Administered 2021-01-07: 4 mg via INTRAVENOUS

## 2021-01-07 MED ORDER — OXYCODONE HCL 5 MG PO TABS: 5.0000 mg | ORAL_TABLET | ORAL | Status: DC | PRN

## 2021-01-07 MED ORDER — OXYTOCIN-SODIUM CHLORIDE 30-0.9 UT/500ML-% IV SOLN: 2.5000 [IU]/h | INTRAVENOUS | Status: AC

## 2021-01-07 MED ORDER — TRANEXAMIC ACID-NACL 1000-0.7 MG/100ML-% IV SOLN
INTRAVENOUS | Status: AC
  Filled 2021-01-07: qty 100

## 2021-01-07 MED ORDER — KETOROLAC TROMETHAMINE 30 MG/ML IJ SOLN
INTRAMUSCULAR | Status: AC
  Filled 2021-01-07: qty 1

## 2021-01-07 MED ORDER — ZOLPIDEM TARTRATE 5 MG PO TABS: 5.0000 mg | ORAL_TABLET | Freq: Every evening | ORAL | Status: DC | PRN

## 2021-01-07 MED ORDER — SIMETHICONE 80 MG PO CHEW: 80.0000 mg | CHEWABLE_TABLET | ORAL | Status: DC | PRN

## 2021-01-07 MED ORDER — SOD CITRATE-CITRIC ACID 500-334 MG/5ML PO SOLN
ORAL | Status: AC
  Filled 2021-01-07: qty 30

## 2021-01-07 MED ORDER — OXYTOCIN-SODIUM CHLORIDE 30-0.9 UT/500ML-% IV SOLN: INTRAVENOUS | Status: DC | PRN

## 2021-01-07 MED ORDER — OXYCODONE HCL 5 MG PO TABS: 5.0000 mg | ORAL_TABLET | Freq: Once | ORAL | Status: DC | PRN

## 2021-01-07 MED ORDER — PHENYLEPHRINE HCL-NACL 20-0.9 MG/250ML-% IV SOLN
INTRAVENOUS | Status: DC | PRN
  Administered 2021-01-07: 60 ug/min via INTRAVENOUS

## 2021-01-07 MED ORDER — NALBUPHINE HCL 10 MG/ML IJ SOLN: 5.0000 mg | INTRAMUSCULAR | Status: DC | PRN

## 2021-01-07 MED ORDER — NALBUPHINE HCL 10 MG/ML IJ SOLN
5.0000 mg | Freq: Once | INTRAMUSCULAR | Status: DC | PRN
  Filled 2021-01-07: qty 1

## 2021-01-07 MED ORDER — BUPIVACAINE IN DEXTROSE 0.75-8.25 % IT SOLN
INTRATHECAL | Status: DC | PRN
  Administered 2021-01-07: 1.7 mL via INTRATHECAL

## 2021-01-07 MED ORDER — ACETAMINOPHEN 500 MG PO TABS
1000.0000 mg | ORAL_TABLET | Freq: Four times a day (QID) | ORAL | Status: DC
  Administered 2021-01-07 – 2021-01-10 (×9): 1000 mg via ORAL
  Filled 2021-01-07 (×10): qty 2

## 2021-01-07 MED ORDER — FENTANYL CITRATE (PF) 100 MCG/2ML IJ SOLN: 25.0000 ug | INTRAMUSCULAR | Status: DC | PRN

## 2021-01-07 MED ORDER — COCONUT OIL OIL: 1.0000 "application " | TOPICAL_OIL | Status: DC | PRN

## 2021-01-07 MED ORDER — SIMETHICONE 80 MG PO CHEW
80.0000 mg | CHEWABLE_TABLET | Freq: Three times a day (TID) | ORAL | Status: DC
  Administered 2021-01-07 – 2021-01-10 (×6): 80 mg via ORAL
  Filled 2021-01-07 (×6): qty 1

## 2021-01-07 MED ORDER — NALOXONE HCL 4 MG/10ML IJ SOLN
1.0000 ug/kg/h | INTRAVENOUS | Status: DC | PRN
  Filled 2021-01-07: qty 5

## 2021-01-07 MED ORDER — ACETAMINOPHEN 500 MG PO TABS: 1000.0000 mg | ORAL_TABLET | Freq: Four times a day (QID) | ORAL | Status: DC

## 2021-01-07 MED ORDER — CEFAZOLIN SODIUM-DEXTROSE 2-4 GM/100ML-% IV SOLN
2.0000 g | INTRAVENOUS | Status: AC
  Administered 2021-01-07: 2 g via INTRAVENOUS

## 2021-01-07 MED ORDER — ENOXAPARIN SODIUM 40 MG/0.4ML IJ SOSY
40.0000 mg | PREFILLED_SYRINGE | INTRAMUSCULAR | Status: DC
  Administered 2021-01-08 – 2021-01-10 (×3): 40 mg via SUBCUTANEOUS
  Filled 2021-01-07 (×3): qty 0.4

## 2021-01-07 MED ORDER — OXYCODONE HCL 5 MG/5ML PO SOLN: 5.0000 mg | Freq: Once | ORAL | Status: DC | PRN

## 2021-01-07 MED ORDER — DIBUCAINE (PERIANAL) 1 % EX OINT: 1.0000 "application " | TOPICAL_OINTMENT | CUTANEOUS | Status: DC | PRN

## 2021-01-07 MED ORDER — NALBUPHINE HCL 10 MG/ML IJ SOLN
5.0000 mg | INTRAMUSCULAR | Status: DC | PRN
  Administered 2021-01-07 – 2021-01-08 (×3): 5 mg via INTRAVENOUS
  Filled 2021-01-07 (×2): qty 1

## 2021-01-07 MED ORDER — FENTANYL CITRATE (PF) 100 MCG/2ML IJ SOLN
INTRAMUSCULAR | Status: AC
  Filled 2021-01-07: qty 2

## 2021-01-07 MED ORDER — MORPHINE SULFATE (PF) 0.5 MG/ML IJ SOLN
INTRAMUSCULAR | Status: DC | PRN
  Administered 2021-01-07: 150 ug via INTRATHECAL

## 2021-01-07 MED ORDER — SCOPOLAMINE 1 MG/3DAYS TD PT72
1.0000 | MEDICATED_PATCH | Freq: Once | TRANSDERMAL | Status: DC
  Administered 2021-01-07: 1.5 mg via TRANSDERMAL

## 2021-01-07 MED ORDER — SOD CITRATE-CITRIC ACID 500-334 MG/5ML PO SOLN
30.0000 mL | Freq: Once | ORAL | Status: AC
  Administered 2021-01-07: 30 mL via ORAL

## 2021-01-07 MED ORDER — CEFAZOLIN SODIUM-DEXTROSE 2-3 GM-%(50ML) IV SOLR: INTRAVENOUS | Status: DC | PRN

## 2021-01-07 MED ORDER — ACETAMINOPHEN 10 MG/ML IV SOLN
INTRAVENOUS | Status: AC
  Filled 2021-01-07: qty 100

## 2021-01-07 MED ORDER — DEXAMETHASONE SODIUM PHOSPHATE 4 MG/ML IJ SOLN
INTRAMUSCULAR | Status: AC
  Filled 2021-01-07: qty 1

## 2021-01-07 MED ORDER — NALBUPHINE HCL 10 MG/ML IJ SOLN: 5.0000 mg | Freq: Once | INTRAMUSCULAR | Status: DC | PRN

## 2021-01-07 MED ORDER — ONDANSETRON HCL 4 MG/2ML IJ SOLN
INTRAMUSCULAR | Status: AC
  Filled 2021-01-07: qty 2

## 2021-01-07 MED ORDER — DIPHENHYDRAMINE HCL 50 MG/ML IJ SOLN: 12.5000 mg | INTRAMUSCULAR | Status: DC | PRN

## 2021-01-07 MED ORDER — POVIDONE-IODINE 10 % EX SWAB: 2.0000 "application " | Freq: Once | CUTANEOUS | Status: DC

## 2021-01-07 MED ORDER — PHENYLEPHRINE HCL-NACL 20-0.9 MG/250ML-% IV SOLN
INTRAVENOUS | Status: AC
  Filled 2021-01-07: qty 250

## 2021-01-07 MED ORDER — MEPERIDINE HCL 25 MG/ML IJ SOLN: 6.2500 mg | INTRAMUSCULAR | Status: DC | PRN

## 2021-01-07 MED ORDER — ACETAMINOPHEN 10 MG/ML IV SOLN
1000.0000 mg | Freq: Once | INTRAVENOUS | Status: DC | PRN
  Administered 2021-01-07: 1000 mg via INTRAVENOUS

## 2021-01-07 SURGICAL SUPPLY — 41 items
BENZOIN TINCTURE PRP APPL 2/3 (GAUZE/BANDAGES/DRESSINGS) ×2 IMPLANT
CANISTER PREVENA PLUS 150 (CANNISTER) ×2 IMPLANT
CHLORAPREP W/TINT 26ML (MISCELLANEOUS) ×2 IMPLANT
CLAMP CORD UMBIL (MISCELLANEOUS) IMPLANT
CLOSURE STERI STRIP 1/2 X4 (GAUZE/BANDAGES/DRESSINGS) ×2 IMPLANT
CLOTH BEACON ORANGE TIMEOUT ST (SAFETY) ×2 IMPLANT
DRESSING PREVENA PLUS CUSTOM (GAUZE/BANDAGES/DRESSINGS) ×1 IMPLANT
DRSG OPSITE POSTOP 4X10 (GAUZE/BANDAGES/DRESSINGS) ×2 IMPLANT
DRSG PREVENA PLUS CUSTOM (GAUZE/BANDAGES/DRESSINGS) ×2
ELECT REM PT RETURN 9FT ADLT (ELECTROSURGICAL) ×2
ELECTRODE REM PT RTRN 9FT ADLT (ELECTROSURGICAL) ×1 IMPLANT
EXTRACTOR VACUUM KIWI (MISCELLANEOUS) ×2 IMPLANT
EXTRACTOR VACUUM M CUP 4 TUBE (SUCTIONS) IMPLANT
GAUZE SPONGE 4X4 12PLY STRL LF (GAUZE/BANDAGES/DRESSINGS) ×4 IMPLANT
GLOVE BIOGEL PI IND STRL 7.0 (GLOVE) ×2 IMPLANT
GLOVE BIOGEL PI IND STRL 7.5 (GLOVE) ×2 IMPLANT
GLOVE BIOGEL PI INDICATOR 7.0 (GLOVE) ×2
GLOVE BIOGEL PI INDICATOR 7.5 (GLOVE) ×2
GLOVE ECLIPSE 7.5 STRL STRAW (GLOVE) ×2 IMPLANT
GOWN STRL REUS W/TWL LRG LVL3 (GOWN DISPOSABLE) ×6 IMPLANT
HEMOSTAT ARISTA ABSORB 3G PWDR (HEMOSTASIS) ×2 IMPLANT
KIT ABG SYR 3ML LUER SLIP (SYRINGE) IMPLANT
NEEDLE HYPO 25X5/8 SAFETYGLIDE (NEEDLE) IMPLANT
NS IRRIG 1000ML POUR BTL (IV SOLUTION) ×2 IMPLANT
PACK C SECTION WH (CUSTOM PROCEDURE TRAY) ×2 IMPLANT
PAD ABD 7.5X8 STRL (GAUZE/BANDAGES/DRESSINGS) ×2 IMPLANT
PAD OB MATERNITY 4.3X12.25 (PERSONAL CARE ITEMS) ×2 IMPLANT
PENCIL SMOKE EVAC W/HOLSTER (ELECTROSURGICAL) ×2 IMPLANT
RTRCTR C-SECT PINK 25CM LRG (MISCELLANEOUS) ×2 IMPLANT
STRIP CLOSURE SKIN 1/2X4 (GAUZE/BANDAGES/DRESSINGS) ×2 IMPLANT
SUT PLAIN 2 0 (SUTURE) ×1
SUT PLAIN ABS 2-0 CT1 27XMFL (SUTURE) ×1 IMPLANT
SUT VIC AB 0 CT1 36 (SUTURE) ×2 IMPLANT
SUT VIC AB 0 CTX 36 (SUTURE) ×2
SUT VIC AB 0 CTX36XBRD ANBCTRL (SUTURE) ×2 IMPLANT
SUT VIC AB 2-0 CT1 27 (SUTURE) ×1
SUT VIC AB 2-0 CT1 TAPERPNT 27 (SUTURE) ×1 IMPLANT
SUT VIC AB 4-0 KS 27 (SUTURE) ×2 IMPLANT
TOWEL OR 17X24 6PK STRL BLUE (TOWEL DISPOSABLE) ×2 IMPLANT
TRAY FOLEY W/BAG SLVR 14FR LF (SET/KITS/TRAYS/PACK) ×2 IMPLANT
WATER STERILE IRR 1000ML POUR (IV SOLUTION) ×2 IMPLANT

## 2021-01-07 NOTE — Op Note (Signed)
Cesarean Section Operative Report  [name]  01/07/2021  Indications:  Elective repeat cesarean section, hx of CS, hx of myomectomy    Pre-operative Diagnosis: G2P1001 at [redacted]w[redacted]d, repeat cesarean section due to hx of CS and hx of myomectomy   Post-operative Diagnosis: Same   Surgeon: Surgeon(s) and Role:    * Wouk, Ailene Rud, MD - Primary    * Genia Del, MD - Assisting   Attending Attestation: I was present and scrubbed for the entire procedure.   Assistants: Gerlene Fee, DO  Anesthesia: Spinal    Estimated Blood Loss: 362 ml; 80 cc of cell saver   Total IV Fluids: 1000 ml LR  Urine Output: 50 ml clear urine  Specimens: None; placenta send to L&D  Findings: Viable female infant in cephalic presentation; Apgars pending; weight pending;  clear amniotic fluid; intact placenta with three vessel cord; uterus with 2 obvious serosal fibroids on the anterior surface, fallopian tubes and ovaries not visualized. Dense scar tissue between fascia and rectus muscles.   Baby condition / location:  Couplet care / Skin to Skin   Complications: No complications  Indications: [name] is a 35 y.o. G2P1001 with an IUP [redacted]w[redacted]d presenting for repeat cesarean section.  The risks, benefits, complications, treatment options, and exected outcomes were discussed with the patient . The patient agreed with the proposed plan, giving informed consent. identified as Engineer, technical sales and the procedure verified as C-Section Delivery.  Procedure Details:  The patient was taken back to the operative suite where spinal anesthesia was placed.  A time out was held and the above information confirmed.   After induction of anesthesia, the patient was draped and prepped in the usual sterile manner and placed in a dorsal supine position with a leftward tilt. A Pfannenstiel incision was made over that patient's previous scar and carried down through the subcutaneous tissue  to the fascia. Fascial incision was made sharply and extended transversely. The fascia was separated from the underlying rectus tissue superiorly and inferiorly sharply due to dense scar tissue present. The peritoneum was identified and bluntly entered and extended longitudinally. Alexis retractor was placed. A bladder flap was created. A low transverse uterine incision was made and extended bluntly. Delivered from cephalic presentation with vacuum assistance was a viable female infant with immediate cry.  After waiting 60 seconds for delayed cord cutting, the umbilical cord was clamped and cut, cord blood was obtained for evaluation. Cord pH was not sent. The placenta was removed Intact and appeared normal. The uterine incision was closed with running locked sutures of 0-Vicryl with an imbricating layer of the same.   Hemostasis was observed.  Irrigation performed.  The peritoneum was closed with 2-0 Vicryl. The rectus muscles were examined and hemostasis observed.  Arista applied.  The fascia was then reapproximated with running sutures of 0-Vicryl. The subcuticular closure was performed using 2-0 plain gut. The skin was closed with 4-0 Vicryl.  Instrument, sponge, and needle counts were correct prior the abdominal closure and were correct at the conclusion of the case.   Disposition: PACU - hemodynamically stable.   Maternal Condition: Stable   Signed: Genia Del, MD 01/07/2021 12:03 PM

## 2021-01-07 NOTE — Anesthesia Procedure Notes (Signed)
Spinal  Patient location during procedure: OR Start time: 01/07/2021 10:25 AM End time: 01/07/2021 10:30 AM Reason for block: surgical anesthesia Staffing Performed: anesthesiologist  Anesthesiologist: Merlinda Frederick, MD Preanesthetic Checklist Completed: patient identified, IV checked, risks and benefits discussed, surgical consent, monitors and equipment checked, pre-op evaluation and timeout performed Spinal Block Patient position: sitting Prep: DuraPrep Patient monitoring: cardiac monitor, continuous pulse ox and blood pressure Approach: midline Location: L3-4 Injection technique: single-shot Needle Needle type: Pencan  Needle gauge: 24 G Needle length: 9 cm Assessment Events: CSF return Additional Notes Functioning IV was confirmed and monitors were applied. Sterile prep and drape, including hand hygiene and sterile gloves were used. The patient was positioned and the spine was prepped. The skin was anesthetized with lidocaine.  Free flow of clear CSF was obtained prior to injecting local anesthetic into the CSF.  The spinal needle aspirated freely following injection.  The needle was carefully withdrawn.  The patient tolerated the procedure well.

## 2021-01-07 NOTE — Transfer of Care (Signed)
Immediate Anesthesia Transfer of Care Note  Patient: [name]  Procedure(s) Performed: CESAREAN SECTION APPLICATION OF CELL SAVER  Patient Location: PACU  Anesthesia Type:Spinal  Level of Consciousness: awake  Airway & Oxygen Therapy: Patient Spontanous Breathing  Post-op Assessment: Report given to RN and Post -op Vital signs reviewed and stable  Post vital signs: Reviewed and stable  Last Vitals:  Vitals Value Taken Time  BP 99/59 01/07/21 1223  Temp    Pulse    Resp 16 01/07/21 1225  SpO2    Vitals shown include unvalidated device data.  Last Pain:  Vitals:   01/07/21 0858  TempSrc: Oral         Complications: No notable events documented.

## 2021-01-07 NOTE — Discharge Summary (Signed)
   Postpartum Discharge Summary    Patient Name: [name] DOB: 05/08/1985 MRN: [id number]  Date of admission: 01/07/2021 Delivery date:01/07/2021  Delivering provider: WOUK, NOAH BEDFORD  Date of discharge: 01/13/2021  Admitting diagnosis: History of cesarean section [Z98.891] Intrauterine pregnancy: [redacted]w[redacted]d     Secondary diagnosis:  Principal Problem:   Cesarean delivery delivered Active Problems:   Cystic fibrosis carrier   Fibroids   AMA (advanced maternal age) multigravida 35+   Hyperthyroidism during pregnancy   Microcytic anemia   History of cesarean section   Hx of myomectomy  Additional problems: None     Discharge diagnosis: Term Pregnancy Delivered                                              Postpartum procedures: None  Augmentation: N/A Complications: None  Hospital course: Sceduled C/S   34 y.o. yo G2P1001 at [redacted]w[redacted]d was admitted to the hospital 01/07/2021 for scheduled cesarean section with the following indication: Elective Repeat due to hx of CS and hx of myomectomy.Delivery details are as follows:  Membrane Rupture Time/Date: 11:03 AM ,01/07/2021   Delivery Method:C-Section, Low Transverse  Details of operation can be found in separate operative note.  Patient had an uncomplicated postpartum course.  She is ambulating, tolerating a regular diet, passing flatus, and urinating well. Patient is discharged home in stable condition on  01/13/21        Newborn Data: Birth date:01/07/2021  Birth time:11:04 AM  Gender:Female  Living status:Living  Apgars:8 ,9  Weight:6 lb 11.1 oz (3.035 kg)     Magnesium Sulfate received: No BMZ received: No Rhophylac: N/A MMR: N/A - Immune  T-DaP: Given prenatally Flu: Yes - given prenatally Transfusion: No   Physical exam  Vitals:   01/09/21 0506 01/09/21 1500 01/09/21 2307 01/10/21 0650  BP: 110/65 114/78 109/67 110/62  Pulse: 65 71 72 70  Resp: 16 17 18 18  Temp: 98.7 F (37.1 C) 98.4 F (36.9 C) 98.5 F  (36.9 C) 98.5 F (36.9 C)  TempSrc: Oral Oral Oral Oral  SpO2: 97%  100% 100%  Weight:      Height:       General: alert, cooperative, and no distress Lochia: appropriate Uterine Fundus: firm Incision: Dressing is clean, dry, and intact, discharged with provena, has follow up in one week for removal DVT Evaluation: No evidence of DVT seen on physical exam.  Labs: Lab Results  Component Value Date   WBC 8.3 01/05/2021   HGB 7.5 (L) 01/08/2021   HCT 28.1 (L) 01/05/2021   MCV 83.4 01/05/2021   PLT 166 01/05/2021   CMP Latest Ref Rng & Units 07/09/2020  Glucose 65 - 99 mg/dL 60(L)  BUN 6 - 20 mg/dL 7  Creatinine 0.57 - 1.00 mg/dL 0.62  Sodium 134 - 144 mmol/L 134  Potassium 3.5 - 5.2 mmol/L 4.2  Chloride 96 - 106 mmol/L 103  CO2 20 - 29 mmol/L 17(L)  Calcium 8.7 - 10.2 mg/dL 9.3  Total Protein 6.0 - 8.5 g/dL 6.9  Total Bilirubin 0.0 - 1.2 mg/dL 0.4  Alkaline Phos 44 - 121 IU/L 44  AST 0 - 40 IU/L 27  ALT 0 - 32 IU/L 26   Edinburgh Score: Edinburgh Postnatal Depression Scale Screening Tool 01/08/2021  I have been able to laugh and see the funny side of things.   0  I have looked forward with enjoyment to things. 0  I have blamed myself unnecessarily when things went wrong. 0  I have been anxious or worried for no good reason. 0  I have felt scared or panicky for no good reason. 0  Things have been getting on top of me. 1  I have been so unhappy that I have had difficulty sleeping. 0  I have felt sad or miserable. 0  I have been so unhappy that I have been crying. 0  The thought of harming myself has occurred to me. 0  Edinburgh Postnatal Depression Scale Total 1     After visit meds:  Allergies as of 01/10/2021   No Known Allergies      Medication List     STOP taking these medications    aspirin EC 81 MG tablet   ferrous sulfate 325 (65 FE) MG EC tablet   terconazole 0.4 % vaginal cream Commonly known as: TERAZOL 7       TAKE these medications     acetaminophen 500 MG tablet Commonly known as: TYLENOL Take 2 tablets (1,000 mg total) by mouth every 6 (six) hours. What changed:  how much to take when to take this reasons to take this   ibuprofen 600 MG tablet Commonly known as: ADVIL Take 1 tablet (600 mg total) by mouth every 6 (six) hours.         Discharge home in stable condition Infant Feeding: Bottle and Breast Infant Disposition:NICU Discharge instruction: per After Visit Summary and Postpartum booklet. Activity: Advance as tolerated. Pelvic rest for 6 weeks.  Diet: routine diet Future Appointments: Future Appointments  Date Time Provider [place]  01/16/2021  9:35 AM Anyanwu, Sallyanne Havers, MD [facility] None  02/05/2021 10:55 AM Constant, Vickii Chafe, MD [place] None  02/16/2021 10:30 AM Shamleffer, Melanie Crazier, MD [facility] None   Follow up Visit:  [place] Follow up.   Specialty: Obstetrics and Gynecology Why: 01/16/21 at 9:35am for removal of the provena 11/3 at 10:55 for postpartum follow-up Contact information: [address] [place] [phone]               Message sent to [facility] by Dr. Gwenlyn Perking on 01/07/21.  Please schedule this patient for a In person postpartum visit in 4 weeks with the following provider: MD. Additional Postpartum F/U: Incision check 1 week (Prevena in place) High risk pregnancy complicated by:  Hx of CS, Hx of myomectomy Delivery mode:  C-Section, Low Transverse  Anticipated Birth Control:  Unsure   01/13/2021 Gabriel Carina, CNM

## 2021-01-07 NOTE — Lactation Note (Signed)
This note was copied from a baby's chart. Lactation Consultation Note  Patient Name: [name] [id number] Date: 01/07/2021 Reason for consult: Initial assessment;Early term 37-38.6wks;Primapara;1st time breastfeeding;Hyperbilirubinemia Age:38 hours   P1 mother whose infant is now 79 hours old.  This is an ETI at 37+1 weeks.  Mother's feeding preference is breast/formula.    Baby "[name]" was just put under double phototherapy when I arrived.  Bilirubin level was 10.6 mg/dl at 3 hours 43 minutes of life: DAT+.  Reviewed phototherapy guidelines with mother who is very sleepy at this time.  Suggested she begin pumping with the DEBP and she is interested.  Supplies brought into the room but she can hardly keep her eyes open.  Suggested she observe me feeding baby and then take a nap.  Mother will call after napping to begin pumping.  Options provided as to donor milk or formula for supplementation.  Mother stated she is going to formula feed anyway so she desires formula.  Discussed parameters for using the formula, expiration and nipple choice.  Demonstrated paced bottle feeding and baby easily consumed 15 mls within 5 minutes.  Burped well.  Father and two visitors arrived after feeding.  Mother will call for pump set up when awake.  Education will need to be reviewed due to sleepiness.  Mom made aware of O/P services, breastfeeding support groups, community resources, and our phone # for post-discharge questions.  RN updated.   Maternal Data Has patient been taught Hand Expression?: No Does the patient have breastfeeding experience prior to this delivery?: No  Feeding Mother's Current Feeding Choice: Breast Milk and Formula Nipple Type: Slow - flow  LATCH Score Latch: Repeated attempts needed to sustain latch, nipple held in mouth throughout feeding, stimulation needed to elicit sucking reflex.  Audible Swallowing: None  Type of Nipple: Everted at rest and after  stimulation  Comfort (Breast/Nipple): Soft / non-tender  Hold (Positioning): Assistance needed to correctly position infant at breast and maintain latch.  LATCH Score: 6   Lactation Tools Discussed/Used Tools: Bottle  Interventions    Discharge    Consult Status Consult Status: Follow-up Date: 01/08/21 Follow-up type: In-patient    Elyjah Hazan R Raziyah Vanvleck 01/07/2021, 4:07 PM

## 2021-01-07 NOTE — Anesthesia Postprocedure Evaluation (Signed)
Anesthesia Post Note  Patient: [name]  Procedure(s) Performed: CESAREAN SECTION APPLICATION OF CELL SAVER     Patient location during evaluation: Mother Baby Anesthesia Type: Spinal Level of consciousness: oriented and awake and alert Pain management: pain level controlled Vital Signs Assessment: post-procedure vital signs reviewed and stable Respiratory status: spontaneous breathing and respiratory function stable Cardiovascular status: blood pressure returned to baseline and stable Postop Assessment: no headache, no backache, no apparent nausea or vomiting and able to ambulate Anesthetic complications: no   No notable events documented.  Last Vitals:  Vitals:   01/07/21 1333 01/07/21 1344  BP: 102/69 108/70  Pulse:  80  Resp:  18  Temp:  (!) 36.4 C  SpO2:  100%    Last Pain:  Vitals:   01/07/21 1344  TempSrc: Oral  PainSc: 0-No pain   Pain Goal:                   Ausha Sieh

## 2021-01-07 NOTE — Anesthesia Preprocedure Evaluation (Addendum)
Anesthesia Evaluation  Patient identified by MRN, date of birth, ID band Patient awake    Reviewed: Allergy & Precautions, NPO status , Patient's Chart, lab work & pertinent test results  Airway Mallampati: I  TM Distance: >3 FB Neck ROM: Full    Dental no notable dental hx.    Pulmonary neg pulmonary ROS,    Pulmonary exam normal breath sounds clear to auscultation       Cardiovascular negative cardio ROS Normal cardiovascular exam Rhythm:Regular Rate:Normal     Neuro/Psych negative neurological ROS  negative psych ROS   GI/Hepatic negative GI ROS, Neg liver ROS,   Endo/Other  negative endocrine ROS  Renal/GU negative Renal ROS  negative genitourinary   Musculoskeletal negative musculoskeletal ROS (+)   Abdominal   Peds negative pediatric ROS (+)  Hematology  (+) anemia ,   Anesthesia Other Findings   Reproductive/Obstetrics (+) Pregnancy                           Anesthesia Physical Anesthesia Plan  ASA: 3  Anesthesia Plan: Spinal   Post-op Pain Management:    Induction: Intravenous  PONV Risk Score and Plan: 2 and Treatment may vary due to age or medical condition  Airway Management Planned: Natural Airway  Additional Equipment: None  Intra-op Plan:   Post-operative Plan:   Informed Consent: I have reviewed the patients History and Physical, chart, labs and discussed the procedure including the risks, benefits and alternatives for the proposed anesthesia with the patient or authorized representative who has indicated his/her understanding and acceptance.     Dental advisory given  Plan Discussed with: CRNA, Anesthesiologist and Surgeon  Anesthesia Plan Comments: (Will setup 2u prbcs to be available for OR. 2 PIV. Spinal. GETA as backup plan. Norton Blizzard, MD  )       Anesthesia Quick Evaluation

## 2021-01-07 NOTE — Lactation Note (Signed)
This note was copied from a baby's chart. Lactation Consultation Note  Patient Name: [name] [id number] Date: 01/07/2021 Reason for consult: Follow-up assessment;Mother's request  [place] set up DEBP and assessed flange size 27 as comfortable fit according to mom.  Mom to post pump with dEBP q 3hrs for 22min   Maternal Data    Feeding Mother's Current Feeding Choice: Breast Milk and Formula  LATCH Score                    Lactation Tools Discussed/Used Tools: Pump;Flanges Flange Size: 27 (LC started with 24 flange with pumping, mother states 27 flange better fit.) Breast pump type: Double-Electric Breast Pump Pump Education: Setup, frequency, and cleaning;Milk Storage Reason for Pumping: increase stimulation Pumping frequency: every 3 hrs for 15 min  Interventions Interventions: Breast feeding basics reviewed;Support pillows;DEBP;Education  Discharge    Consult Status Consult Status: Follow-up Date: 01/08/21 Follow-up type: In-patient    Katriona Schmierer  Nicholson-Springer 01/07/2021, 11:40 PM

## 2021-01-07 NOTE — Lactation Note (Signed)
This note was copied from a baby's chart. Lactation Consultation Note  Patient Name: [name] [id number] Date: 01/07/2021 Reason for consult: Follow-up assessment Age:37 hours   P1 mother whose infant is now 104 hours old.  This is an ETI at 37+1 weeks.  Mother's feeding preference is breast/formula.    Mother awake now and requested latch assistance.  RN assisted, however, baby was not interested.  Temperature low and baby now swaddled with bili blanket on and mother holding him.    Due to the high bilirubin level and temperature decrease I suggested mother feed him another 15 mls of formula at this time after pediatrician's assessment is complete.  Mother verbalized understanding and will call RN if unable to get baby to feed.  He will get another bilirubin level at 2100 tonight.  Suggested mother call after feeding is completed to get the DEBP initiated.  Mother verbalized understanding.  Visitors in room.  RN to start a third phototherapy light per MD.   Maternal Data Has patient been taught Hand Expression?: No Does the patient have breastfeeding experience prior to this delivery?: No  Feeding Mother's Current Feeding Choice: Breast Milk and Formula Nipple Type: Slow - flow  LATCH Score                    Lactation Tools Discussed/Used Tools: Bottle  Interventions    Discharge    Consult Status Consult Status: Follow-up Date: 01/08/21 Follow-up type: In-patient    Little Ishikawa 01/07/2021, 6:42 PM

## 2021-01-08 LAB — HEMOGLOBIN: Hemoglobin: 7.5 g/dL — ABNORMAL LOW (ref 12.0–15.0)

## 2021-01-08 MED ORDER — SODIUM CHLORIDE 0.9 % IV SOLN
500.0000 mg | Freq: Once | INTRAVENOUS | Status: AC
  Administered 2021-01-08: 500 mg via INTRAVENOUS
  Filled 2021-01-08: qty 25

## 2021-01-08 MED ORDER — POLYETHYLENE GLYCOL 3350 17 G PO PACK
17.0000 g | PACK | Freq: Every day | ORAL | Status: DC
  Administered 2021-01-08 – 2021-01-10 (×2): 17 g via ORAL
  Filled 2021-01-08 (×2): qty 1

## 2021-01-08 NOTE — Progress Notes (Signed)
Patient screened out for psychosocial assessment since none of the following apply:  Psychosocial stressors documented in mother or baby's chart  Gestation less than 32 weeks  Code at delivery   Infant with anomalies Please contact the Clinical Social Worker if specific needs arise, by MOB's request, or if MOB scores greater than 9/yes to question 10 on Edinburgh Postpartum Depression Screen.  Thirza Pellicano, LCSW Clinical Social Worker [facility] Cell#: [phone]     

## 2021-01-08 NOTE — Lactation Note (Signed)
This note was copied from a baby's chart. Lactation Consultation Note  Patient Name: [name] Today's Date: 01/08/2021 Reason for consult: Follow-up assessment;NICU baby;Early term 37-38.6wks;Hyperbilirubinemia Age:37 hours  Lactation followed up with [name] on [facility] floor. She reports that she is fatigued and needs to rest. She has her DEBP set up and has initiated pumping, but frequency appears to be sporadic. I encouraged her to continue to pump 8 times a day and provided education on the benefits of early/frequent pumping to stimulate breasts.  [name] plans to breast feed baby [name]. I offered to assist when she is in the NICU later today. She stated that she would like that. I encouraged her to bring her DEBP kit to the NICU so we could also observe her pump while there. I will follow up with the NICU RN for notification of mom's arrival on the NICU floor.  Maternal Data Does the patient have breastfeeding experience prior to this delivery?: Yes How long did the patient breastfeed?: 3 months  Feeding Mother's Current Feeding Choice: Breast Milk and Donor Milk Nipple Type: Slow - flow   Lactation Tools Discussed/Used Pump Education: Setup, frequency, and cleaning Pumping frequency: infrequent Pumped volume: 0 mL  Interventions Interventions: Education;DEBP  Discharge Pump: DEBP  Consult Status Consult Status: Follow-up Date: 01/08/21 Follow-up type: In-patient    [name] 01/08/2021, 10:20 AM    

## 2021-01-08 NOTE — Progress Notes (Signed)
POSTPARTUM PROGRESS NOTE  Subjective: [name] is a 35 y.o. G2P1001 POD#1 s/p pLTCS for breech presentation at [redacted]w[redacted]d.  She reports she doing well. No acute events overnight. She denies any problems with ambulating, voiding or po intake. Denies nausea or vomiting. She has passed flatus. Pain is moderately controlled.  Lochia is scant.   Feels bloated, has not been walking around much. Will start today. Would like some miralax.   Discussed baby's circumcision and patient consented. See note in baby's chart  Objective: Blood pressure 110/60, pulse 62, temperature 97.7 F (36.5 C), temperature source Oral, resp. rate 18, height 5\' 6"  (1.676 m), weight 86.2 kg, last menstrual period 04/29/2020, SpO2 100 %, unknown if currently breastfeeding.  Physical Exam:  General: alert, cooperative and no distress Chest: no respiratory distress Abdomen: soft, non-tender  incision clean/dry/intact  Uterine Fundus: firm, appropriately tender Extremities: No calf swelling or tenderness  trace edema  Recent Labs    01/05/21 1132 01/08/21 0503  HGB 9.0* 7.5*  HCT 28.1*  --     Assessment/Plan: [name] is a 35 y.o. G2P1001 s/p POD#1 s/p pLTCS for breech presentation at [redacted]w[redacted]d.  POD#: Doing well, pain well-controlled.  -- Routine postpartum care -- Encouraged up OOB -- Lovenox for VTE prophylaxis  #Acute blood loss anemia HgB 9.0>7.5 -IV Venofer today  #abdominal bloating Passing some gas. Discussed moving around and warm liquids to help move bowels. - Miralax also for history of constipation  Routine Postpartum Care -- Contraception: declines -- Feeding: breast and bottle  Dispo: Plan for discharge tomorrow on POD#.  Renard Matter, MD, MPH OB Fellow, [facility] for [facility]

## 2021-01-09 LAB — TYPE AND SCREEN
ABO/RH(D): O POS
Antibody Screen: NEGATIVE
Unit division: 0
Unit division: 0

## 2021-01-09 LAB — BPAM RBC
Blood Product Expiration Date: 202210312359
Blood Product Expiration Date: 202211012359
Unit Type and Rh: 5100
Unit Type and Rh: 5100

## 2021-01-09 NOTE — Progress Notes (Addendum)
POSTPARTUM PROGRESS NOTE  Subjective: [name] is a 35 y.o. G2P1001 s/p rLTCS at [redacted]w[redacted]d.  She reports she doing well. No acute events overnight. She denies any problems with ambulating, voiding or po intake. Denies nausea or vomiting. She has passed flatus. She has not had bowel movement. Pain is well controlled.  Lochia is Minimal. Received IV Venofer for low Hgb. Denies dizziness or lightheadedness.  Objective: BP 110/65 (BP Location: Left Arm)   Pulse 65   Temp 98.7 F (37.1 C) (Oral)   Resp 16   Ht 5\' 6"  (1.676 m)   Wt 86.2 kg   LMP 04/29/2020 (Exact Date)   SpO2 97%   BMI 30.67 kg/m   Physical Exam:  General: alert, cooperative and no distress Chest: CTAB, no respiratory distress Abdomen: soft, non-tender  Uterine Fundus: firm, appropriately tender Extremities: No calf swelling, tenderness, or edema  Recent Labs    01/08/21 0503  HGB 7.5*    Assessment/Plan: [name] is a 35 y.o. G2P1001 s/p rLTCS at [redacted]w[redacted]d.   LOS: 2 days   Routine Postpartum Care: Doing well, pain well-controlled.  -- Continue routine care, lactation support  -- Contraception: no method -- Feeding: both -- Circumcision: consented, yet to be performed  Dispo: Plan for discharge when NICU releases baby. Still monitoring baby for jaundice/hyperbilirubinemia.  Wells Guiles, DO 01/09/2021, 7:58 AM PGY-1, [place]

## 2021-01-09 NOTE — Lactation Note (Signed)
This note was copied from a baby's chart. Lactation Consultation Note Mother is pumping and offering breast today. She denies breast changes at 50+ hours pp. Mother is hopeful for d/c later today. We reviewed the need to continue pumping until baby is bf'ing consistently. Mother requests stork pump.   Patient Name: [name] [id number] Date: 01/09/2021 Reason for consult: Follow-up assessment Age:37 hours   Feeding Mother's Current Feeding Choice: Breast Milk and Formula Nipple Type: Nfant Slow Flow (purple)  Interventions Interventions: Education  Discharge Discharge Education: Engorgement and breast care  Consult Status Consult Status: Follow-up Date: 01/09/21 Follow-up type: In-patient   Gwynne Edinger 01/09/2021, 3:45 PM

## 2021-01-10 MED ORDER — IBUPROFEN 600 MG PO TABS: 600.0000 mg | ORAL_TABLET | Freq: Four times a day (QID) | ORAL | 0 refills | Status: DC

## 2021-01-10 MED ORDER — ACETAMINOPHEN 500 MG PO TABS: 1000.0000 mg | ORAL_TABLET | Freq: Four times a day (QID) | ORAL | 0 refills | Status: DC

## 2021-01-10 NOTE — Lactation Note (Signed)
This note was copied from a baby's chart. Lactation Consultation Note  Patient Name: [name] [id number] Date: 01/10/2021 Reason for consult: Follow-up assessment;Early term 37-38.6wks;NICU baby;Other (Comment);Hyperbilirubinemia (maternal discharge) Age:37 hours  Visited with mom of 55 hours old ETI NICU female, she's going home today. Reviewed discharge education, engorgement prevention/treatment and sore nipples. She hasn't been pumping at all, explained to mom the importance of consistent pumping for the onset of lactogenesis II.  She voiced she's going to start pumping now that she's going home. Reviewed pumping schedule, lactogenesis II, supply/demand and benefits of breastmilk for NICU babies.  Plan of care:  Encouraged mom to start pumping every 3 hours, at least 8 pumping sessions/24 hours Hand expression and breast massage were also encouraged prior pumping  No other support person at this time. All questions and concerns answered, mom to call NICU LC PRN.  Maternal Data   Unable to assess mom's supply since she hasn't started pumping yet  Feeding Mother's Current Feeding Choice: Breast Milk and Formula Nipple Type: Nfant Slow Flow (purple)  LATCH Score Latch: Grasps breast easily, tongue down, lips flanged, rhythmical sucking.  Audible Swallowing: Spontaneous and intermittent  Type of Nipple: Everted at rest and after stimulation  Comfort (Breast/Nipple): Soft / non-tender  Lactation Tools Discussed/Used Tools: Pump;Flanges Flange Size: 24;27 Breast pump type: Double-Electric Breast Pump Pump Education: Setup, frequency, and cleaning;Milk Storage Reason for Pumping: NICU stay, hyperbilirubinemia Pumping frequency: none, but mom voiced she has put baby to breast Pumped volume: 0 mL (has not started pumping yet as today)  Discharge Discharge Education: Engorgement and breast care Pump: DEBP;Stork Pump (got stork pump yesterday)  Consult  Status Consult Status: Follow-up Date: 01/10/21 Follow-up type: In-patient   [name] 01/10/2021, 1:52 PM

## 2021-01-16 ENCOUNTER — Encounter: Payer: Self-pay | Admitting: Obstetrics & Gynecology

## 2021-01-16 ENCOUNTER — Other Ambulatory Visit: Payer: Self-pay

## 2021-01-16 ENCOUNTER — Ambulatory Visit (INDEPENDENT_AMBULATORY_CARE_PROVIDER_SITE_OTHER): Payer: BC Managed Care – PPO | Admitting: Obstetrics & Gynecology

## 2021-01-16 DIAGNOSIS — Z5189 Encounter for other specified aftercare: Secondary | ICD-10-CM

## 2021-01-16 NOTE — Progress Notes (Signed)
Pt is in the office for pp incision check, c section on 01-07-21. Pt states that she is unsure if wound vac is working correctly.

## 2021-01-16 NOTE — Progress Notes (Signed)
   POSTPARTUM OFFICE VISIT NOTE  History:   [name] is a 35 y.o. [id number] POD#11 s/p repeat cesarean section here today for Prevena removal and incision check. She denies any abnormal incisional discharge,  pain or other concerns.    Past Medical History:  Diagnosis Date   Anemia    Fibroid    Fibroids     Past Surgical History:  Procedure Laterality Date   CESAREAN SECTION     CESAREAN SECTION N/A 01/07/2021   Procedure: CESAREAN SECTION;  Surgeon: Gwynne Edinger, MD;  Location: [facility] LD ORS;  Service: Obstetrics;  Laterality: N/A;  add cell saver   LAPAROSCOPIC GELPORT ASSISTED MYOMECTOMY N/A 07/04/2019   Procedure: LAPAROSCOPIC GELPORT ASSISTED MYOMECTOMY ABLATION OF ENDOMETRIOSIS  TRANSMYOMETRIAL HYSTEROSCOPY LYSIS OF ADHESIONS AND CHROMOTUBATION;  Surgeon: Governor Specking, MD;  Location: [facility];  Service: Gynecology;  Laterality: N/A;  [name] confirmed on 06/18/19 CS    The following portions of the patient's history were reviewed and updated as appropriate: allergies, current medications, past family history, past medical history, past social history, past surgical history and problem list.   Review of Systems:  Pertinent items noted in HPI and remainder of comprehensive ROS otherwise negative.  Physical Exam:  BP 129/76   Pulse (!) 106   Wt 186 lb 1.6 oz (84.4 kg)   LMP 04/29/2020 (Exact Date)   Breastfeeding Yes   BMI 30.04 kg/m  CONSTITUTIONAL: Well-developed, well-nourished female in no acute distress.  MUSCULOSKELETAL: Normal range of motion. No edema noted. NEUROLOGIC: Alert and oriented to person, place, and time. Normal muscle tone coordination. No cranial nerve deficit noted. PSYCHIATRIC: Normal mood and affect. Normal behavior. Normal judgment and thought content. CARDIOVASCULAR: Normal heart rate noted RESPIRATORY: Effort and breath sounds normal, no problems with respiration noted ABDOMEN: Prevena removed. Incision  healing well, C/D/I, no erythema, no drainage. No induration. Non tender. No masses noted. No other overt distention noted.   PELVIC: Deferred     Assessment and Plan:     1. Cesarean delivery delivered 2. Visit for wound check Prevena removed Incision healing well Follow up for postpartum check as scheduled.  Return for Postpartum check as scheduled.     Verita Schneiders, MD, [place] for [employer], [place]

## 2021-01-19 ENCOUNTER — Telehealth (HOSPITAL_COMMUNITY): Payer: Self-pay

## 2021-01-19 NOTE — Telephone Encounter (Signed)
"  Things are going ok." Patient declines having any questions or concerns about her healing.  "He is doing good. He is sleeping right now. Eating and gaining weight. He sleeps in a crib." RN reviewed ABC's of safe sleep with patient. Patient declines any questions or concerns about baby.  EPDS score is 0.  Sharyn Lull [facility] 01/17/2021,1503

## 2021-02-05 ENCOUNTER — Other Ambulatory Visit: Payer: Self-pay

## 2021-02-05 ENCOUNTER — Ambulatory Visit (INDEPENDENT_AMBULATORY_CARE_PROVIDER_SITE_OTHER): Payer: BC Managed Care – PPO | Admitting: Obstetrics and Gynecology

## 2021-02-05 NOTE — Progress Notes (Signed)
Post Partum Visit Note  [name] is a 35 y.o. G15P1001 female who presents for a postpartum visit. She is 4 weeks postpartum following a repeat cesarean section.  I have fully reviewed the prenatal and intrapartum course. The delivery was at 37w gestational weeks.  Anesthesia: spinal. Postpartum course has been unremarkable. Baby is doing well. Baby is feeding by both breast and bottle - Gerber Gentle . Bleeding no bleeding. Bowel function is normal. Bladder function is normal. Patient is not sexually active. Contraception method is  unsure about method of contraception . Postpartum depression screening: negative. EPDS: 0   The pregnancy intention screening data noted above was reviewed. Potential methods of contraception were discussed. The patient elected to proceed with No data recorded.   Edinburgh Postnatal Depression Scale - 02/05/21 1104       Edinburgh Postnatal Depression Scale:  In the Past 7 Days   I have been able to laugh and see the funny side of things. 0    I have looked forward with enjoyment to things. 0    I have blamed myself unnecessarily when things went wrong. 0    I have been anxious or worried for no good reason. 0    I have felt scared or panicky for no good reason. 0    Things have been getting on top of me. 0    I have been so unhappy that I have had difficulty sleeping. 0    I have felt sad or miserable. 0    I have been so unhappy that I have been crying. 0    The thought of harming myself has occurred to me. 0    Edinburgh Postnatal Depression Scale Total 0              The following portions of the patient's history were reviewed and updated as appropriate: allergies, current medications, past family history, past medical history, past social history, past surgical history, and problem list.  Review of Systems Pertinent items noted in HPI and remainder of comprehensive ROS otherwise negative.    Objective:  BP 120/77   Pulse 74    Ht 5\' 6"  (1.676 m)   Wt 180 lb (81.6 kg)   LMP 04/29/2020 (Exact Date)   Breastfeeding Yes   BMI 29.05 kg/m    General:  alert, cooperative, and no distress   Breasts:  inspection negative, no nipple discharge or bleeding, no masses or nodularity palpable  Lungs: clear to auscultation bilaterally  Heart:  regular rate and rhythm  Abdomen: soft, non-tender; bowel sounds normal; no masses,  no organomegaly and incision healed without erythema, induration or drainage   Vulva:  not evaluated  Vagina: not evaluated                    Assessment:    Normal postpartum exam. Pap smear not done at today's visit.   Plan:   Essential components of care per ACOG recommendations:  1.  Mood and well being: Patient with negative depression screening today. Reviewed local resources for support.  - Patient does not use tobacco.  - hx of drug use? No    2. Infant care and feeding:  -Patient currently breastmilk feeding? Yes Reviewed importance of draining breast regularly to support lactation. -Social determinants of health (SDOH) reviewed in EPIC. No concerns  3. Sexuality, contraception and birth spacing - Patient does want a pregnancy in the next year.  Desired family size is  3 children.  - Reviewed forms of contraception in tiered fashion. Patient desired no method today.   - Discussed birth spacing of 18 months  4. Sleep and fatigue -Encouraged family/partner/community support of 4 hrs of uninterrupted sleep to help with mood and fatigue  5. Physical Recovery  - Discussed patients delivery and complications - Patient has urinary incontinence? No  - Patient is safe to resume physical and sexual activity  6.  Health Maintenance - Last pap smear done 07/2020 and was normal with negative HPV.  7. Chronic Disease - PCP follow up  Mora Bellman, MD [facility]

## 2021-02-16 ENCOUNTER — Ambulatory Visit (INDEPENDENT_AMBULATORY_CARE_PROVIDER_SITE_OTHER): Payer: BC Managed Care – PPO | Admitting: Internal Medicine

## 2021-02-16 ENCOUNTER — Other Ambulatory Visit: Payer: Self-pay

## 2021-02-16 ENCOUNTER — Encounter: Payer: Self-pay | Admitting: Internal Medicine

## 2021-02-16 VITALS — BP 120/70 | HR 70 | Ht 66.0 in | Wt 180.0 lb

## 2021-02-16 DIAGNOSIS — E0590 Thyrotoxicosis, unspecified without thyrotoxic crisis or storm: Secondary | ICD-10-CM

## 2021-02-16 NOTE — Progress Notes (Signed)
Name: [name]  MRN/ DOB: [id number], Dec 08, 1985    Age/ Sex: 35 y.o., female     PCP: Kathryn Essex, MD   Reason for Endocrinology Evaluation: Hyperthyroidism     Initial Endocrinology Clinic Visit: 07/21/2020    PATIENT IDENTIFIER: 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 She has followed with [facility] since 07/21/2020 for consultative assistance with management of her Hyperthyroidism .   HISTORICAL SUMMARY:   She was noted to have hyperthyroidism during routine labs on 07/09/2020 with a  TSH of 0.03 uIU/mL and elevated T3 and T4 at 200 ng/dl and 15.1 ug/dL respectively, she was pregnant at the time  She was started on PTU at ~ 12 weeks of gestation due to a total T4 of 17.9 mcg/dl and suppressed TSH. Discontinued by 21 weeks of gestation due to normalization of TFT's  S/P C-section 01/2021  No FH of thyroid disease  She is an LPN    SUBJECTIVE:   Today (02/16/2021):  [name] is here for Hyperthyroidism.    She is S/P C-section 01/07/2021 - healthy baby    She is not nursing - pumping    She has been noted with  weight loss since delivery  She denies  Denies palpitations  Denies tremors  Denies local neck symptoms   She had hand tingling and pain ~  at 8 months of gestation, symptoms remain with minor improvement    HISTORY:  Past Medical History:  Past Medical History:  Diagnosis Date   Anemia    Fibroid    Fibroids    Past Surgical History:  Past Surgical History:  Procedure Laterality Date   CESAREAN SECTION     CESAREAN SECTION N/A 01/07/2021   Procedure: CESAREAN SECTION;  Surgeon: Kathryn Edinger, MD;  Location: [facility];  Service: Obstetrics;  Laterality: N/A;  add cell saver   LAPAROSCOPIC GELPORT ASSISTED MYOMECTOMY N/A 07/04/2019   Procedure: LAPAROSCOPIC GELPORT ASSISTED MYOMECTOMY ABLATION OF ENDOMETRIOSIS  TRANSMYOMETRIAL HYSTEROSCOPY LYSIS OF  ADHESIONS AND CHROMOTUBATION;  Surgeon: Kathryn Specking, MD;  Location: [facility];  Service: Gynecology;  Laterality: N/A;  [name] RNFA confirmed on 06/18/19 CS   Social History:  reports that she has never smoked. She has never used smokeless tobacco. She reports current alcohol use. She reports that she does not use drugs. Family History: No family history on file.   HOME MEDICATIONS: Allergies as of 02/16/2021   No Known Allergies      Medication List        Accurate as of February 16, 2021  7:37 AM. If you have any questions, ask your nurse or doctor.          acetaminophen 500 MG tablet Commonly known as: TYLENOL Take 2 tablets (1,000 mg total) by mouth every 6 (six) hours.   ferrous sulfate 325 (65 FE) MG tablet Take 325 mg by mouth daily with breakfast.   ibuprofen 600 MG tablet Commonly known as: ADVIL Take 1 tablet (600 mg total) by mouth every 6 (six) hours.          OBJECTIVE:   PHYSICAL EXAM: VS: BP 120/70 (BP Location: Left Arm, Patient Position: Sitting, Cuff Size: Small)   Pulse 70   Ht 5\' 6"  (1.676 m)   Wt 180 lb (81.6 kg)   SpO2 97%   BMI 29.05 kg/m    EXAM: General: Pt appears well and is  in NAD  Neck: General: Supple without adenopathy. Thyroid: Thyroid size normal.  No goiter or nodules appreciated. .  Lungs: Clear with good BS bilat with no rales, rhonchi, or wheezes  Heart: Auscultation: RRR.  Abdomen: Normoactive bowel sounds, soft, nontender, without masses or organomegaly palpable  Extremities:  BL LE: No pretibial edema normal ROM and strength.  Mental Status: Judgment, insight: Intact Orientation: Oriented to time, place, and person Mood and affect: No depression, anxiety, or agitation     DATA REVIEWED:  Results for [name] (MRN [id number]) as of 11/17/2020 13:44  Ref. Range 11/17/2020 08:28  TSH Latest Ref Range: 0.35 - 5.50 uIU/mL 0.96     ASSESSMENT / PLAN / RECOMMENDATIONS:     Hyperthyroidism during pregnancy :  - She is clinically euthyroid  - Has been off PTU since 09/2020  - She is S/P C-section 6 weeks ago, we do not have a phlebotomist today, will return for labs   F/U pending labs results    Signed electronically by: Kathryn Guise, MD  [facility] Endocrinology  [address] [place], [place] [address] Phone: [phone] FAX: [phone]      CC: [name], [place] [address] Phone: [phone]  Fax: [phone]   Return to Endocrinology clinic as below: Future Appointments  Date Time Provider [place]  02/16/2021 10:30 AM [name], Kathryn Crazier, MD [facility] None

## 2021-02-20 ENCOUNTER — Other Ambulatory Visit (INDEPENDENT_AMBULATORY_CARE_PROVIDER_SITE_OTHER): Payer: BC Managed Care – PPO

## 2021-02-20 ENCOUNTER — Other Ambulatory Visit: Payer: Self-pay

## 2021-02-20 DIAGNOSIS — E0590 Thyrotoxicosis, unspecified without thyrotoxic crisis or storm: Secondary | ICD-10-CM | POA: Diagnosis not present

## 2021-02-20 LAB — T4, FREE: Free T4: 0.73 ng/dL (ref 0.60–1.60)

## 2021-02-20 LAB — T3, FREE: T3, Free: 3 pg/mL (ref 2.3–4.2)

## 2021-02-20 LAB — TSH: TSH: 0.83 u[IU]/mL (ref 0.35–5.50)

## 2021-04-21 ENCOUNTER — Other Ambulatory Visit: Payer: Self-pay

## 2021-04-21 ENCOUNTER — Encounter: Payer: Self-pay | Admitting: Family Medicine

## 2021-04-21 ENCOUNTER — Ambulatory Visit (INDEPENDENT_AMBULATORY_CARE_PROVIDER_SITE_OTHER): Payer: BC Managed Care – PPO | Admitting: Family Medicine

## 2021-04-21 VITALS — BP 108/71 | HR 63 | Ht 66.0 in | Wt 182.4 lb

## 2021-04-21 DIAGNOSIS — R252 Cramp and spasm: Secondary | ICD-10-CM

## 2021-04-21 DIAGNOSIS — E559 Vitamin D deficiency, unspecified: Secondary | ICD-10-CM | POA: Diagnosis not present

## 2021-04-21 DIAGNOSIS — R202 Paresthesia of skin: Secondary | ICD-10-CM | POA: Diagnosis not present

## 2021-04-21 DIAGNOSIS — Z23 Encounter for immunization: Secondary | ICD-10-CM

## 2021-04-21 DIAGNOSIS — D649 Anemia, unspecified: Secondary | ICD-10-CM

## 2021-04-21 DIAGNOSIS — L309 Dermatitis, unspecified: Secondary | ICD-10-CM

## 2021-04-21 NOTE — Assessment & Plan Note (Signed)
Vitamin D lab ordered. I will contact her soon with her result.

## 2021-04-21 NOTE — Patient Instructions (Signed)
Paresthesia Paresthesia is a burning or prickling feeling. This feeling can happen in any part of the body. It often happens in the hands, arms, legs, or feet. Usually, it is not painful. In most cases, the feeling goes away in a short time and is not a sign of a serious problem. If you have paresthesia that lasts a long time, you need to see your doctor. Follow these instructions at home: Nutrition Eat a healthy diet. This includes: Eating foods that are high in fiber. These include beans, whole grains, and fresh fruits and vegetables. Limiting foods that are high in fat and sugar. These include fried or sweet foods.  Alcohol use  Do not drink alcohol if: Your doctor tells you not to drink. You are pregnant, may be pregnant, or are planning to become pregnant. If you drink alcohol: Limit how much you have to: 0-1 drink a day for women. 0-2 drinks a day for men. Know how much alcohol is in your drink. In the [place] one drink equals one 12 oz bottle of beer (355 mL), one 5 oz glass of wine (148 mL), or one 1 oz glass of hard liquor (44 mL). General instructions Take over-the-counter and prescription medicines only as told by your doctor. Do not smoke or use any products that contain nicotine or tobacco. If you need help quitting, ask your doctor. If you have diabetes, work with your doctor to make sure your blood sugar stays in a healthy range. If your feet feel numb: Check for redness, warmth, and swelling every day. Wear padded socks and comfortable shoes. These help protect your feet. Keep all follow-up visits. Contact a doctor if: You have paresthesia that gets worse or does not go away. You lose feeling (have numbness) after an injury. Your burning or prickling feeling gets worse when you walk. You have pain or cramps. You feel dizzy or you faint. You have a rash. Get help right away if: You feel weak or have new weakness in an arm or leg. You have trouble walking or  moving. You have problems speaking, understanding, or seeing. You feel confused. You cannot control when you pee (urinate) or poop (have a bowel movement). These symptoms may be an emergency. Get help right away. Call 911. Do not wait to see if the symptoms will go away. Do not drive yourself to the hospital. Summary Paresthesia is a burning or prickling feeling. It often happens in the hands, arms, legs, or feet. In most cases, the feeling goes away in a short time and is not a sign of a serious problem. If you have paresthesia that lasts a long time, you need to be seen by your doctor. This information is not intended to replace advice given to you by your health care provider. Make sure you discuss any questions you have with your health care provider. Document Revised: 12/01/2020 Document Reviewed: 12/01/2020 Elsevier Patient Education  [place].

## 2021-04-21 NOTE — Assessment & Plan Note (Signed)
Currently asymptomatic. Mostly associated with positioning. Avoidance of triggering position recommended. Monitor for now.  She agreed with the plan.

## 2021-04-21 NOTE — Progress Notes (Signed)
° ° °  SUBJECTIVE:   CHIEF COMPLAINT / HPI:   HPI Hand cramp: C/O B/L hand tingling, cramping, and numbness ongoing x 5 months. Started in her right hand but now has it in both hands. Initial symptoms started with pregnancy, but no improvement after she delivered her baby in Oct of last year. No swelling or trauma to her hands.  Left LL:  C/O Intermittent left LL cramps with potioning at night time when she feeds her baby. She denies any symptoms now. No LL weakness or loss of sensation.   Rash:  C/O itchy rash on her trunk and LL, which she noticed for the first time on 04/13/21, then re-occured on 04/14/21. Since then, no further episodes. She denies any known exposure. No rash concern today.  Hx of Vit D deficiency: She stated she had this with previous child birth and will like to get tested today.  PERTINENT  PMH / PSH: PMHx  reviewed.  OBJECTIVE:   Vitals:   04/21/21 1123  BP: 108/71  Pulse: 63  SpO2: 100%  Weight: 182 lb 6.4 oz (82.7 kg)  Height: 5\' 6"  (1.676 m)    Physical Exam Vitals reviewed.  Cardiovascular:     Rate and Rhythm: Normal rate and regular rhythm.     Heart sounds: Normal heart sounds. No murmur heard. Pulmonary:     Effort: Pulmonary effort is normal. No respiratory distress.     Breath sounds: Normal breath sounds. No wheezing.  Musculoskeletal:        General: Normal range of motion.     Right lower leg: No edema.     Left lower leg: No edema.  Neurological:     General: No focal deficit present.     Mental Status: She is alert.     Sensory: Sensation is intact.     Motor: No weakness or tremor.     Gait: Gait is intact.     Deep Tendon Reflexes: Reflexes are normal and symmetric.     Comments: No loss of sensation or weakness of her UL and LL B/L     ASSESSMENT/PLAN:   Paresthesia ?? Carpal tunnel syndrome vs vitamin deficiency vs anemia vs endocrine disorder She had recent normal TSH Checked anemia panel today for iron, ferritin,  vitamin B12. She had a recently normal HIV and RPR test. Gabapentin offered - she declined.  Referral to neurology recommended for [facility] and [facility] - she agreed. Referral placed. I will contact her soon with other results.   Leg cramp Currently asymptomatic. Mostly associated with positioning. Avoidance of triggering position recommended. Monitor for now.  She agreed with the plan.   Vitamin D deficiency Vitamin D lab ordered. I will contact her soon with her result.    Dermatitis: Currently asymptomatic. She showed me a picture on her phone. Looks like urticaria. Consider antihistamine if it re-occurs and allergy testing. She verbalized understanding.  Andrena Mews, MD [place]

## 2021-04-21 NOTE — Assessment & Plan Note (Signed)
??   Carpal tunnel syndrome vs vitamin deficiency vs anemia vs endocrine disorder She had recent normal TSH Checked anemia panel today for iron, ferritin, vitamin B12. She had a recently normal HIV and RPR test. Gabapentin offered - she declined.  Referral to neurology recommended for [facility] and [facility] - she agreed. Referral placed. I will contact her soon with other results.

## 2021-04-22 ENCOUNTER — Telehealth: Payer: Self-pay | Admitting: Family Medicine

## 2021-04-22 LAB — ANEMIA PROFILE B
Basophils Absolute: 0 10*3/uL (ref 0.0–0.2)
Basos: 0 %
EOS (ABSOLUTE): 0.1 10*3/uL (ref 0.0–0.4)
Eos: 3 %
Ferritin: 40 ng/mL (ref 15–150)
Folate: 7.7 ng/mL (ref >3.0)
Hematocrit: 36.7 % (ref 34.0–46.6)
Hemoglobin: 11.9 g/dL (ref 11.1–15.9)
Immature Grans (Abs): 0 10*3/uL (ref 0.0–0.1)
Immature Granulocytes: 0 %
Iron Saturation: 20 % (ref 15–55)
Iron: 63 ug/dL (ref 27–159)
Lymphocytes Absolute: 1.6 10*3/uL (ref 0.7–3.1)
Lymphs: 33 %
MCH: 26.8 pg (ref 26.6–33.0)
MCHC: 32.4 g/dL (ref 31.5–35.7)
MCV: 83 fL (ref 79–97)
Monocytes Absolute: 0.4 10*3/uL (ref 0.1–0.9)
Monocytes: 7 %
Neutrophils Absolute: 2.7 10*3/uL (ref 1.4–7.0)
Neutrophils: 57 %
Platelets: 232 10*3/uL (ref 150–450)
RBC: 4.44 x10E6/uL (ref 3.77–5.28)
RDW: 13.7 % (ref 11.7–15.4)
Retic Ct Pct: 1.6 % (ref 0.6–2.6)
Total Iron Binding Capacity: 311 ug/dL (ref 250–450)
UIBC: 248 ug/dL (ref 131–425)
Vitamin B-12: 696 pg/mL (ref 232–1245)
WBC: 4.8 10*3/uL (ref 3.4–10.8)

## 2021-04-22 LAB — VITAMIN D 25 HYDROXY (VIT D DEFICIENCY, FRACTURES): Vit D, 25-Hydroxy: 12.9 ng/mL — ABNORMAL LOW (ref 30.0–100.0)

## 2021-04-22 MED ORDER — VITAMIN D (ERGOCALCIFEROL) 1.25 MG (50000 UNIT) PO CAPS: 50000.0000 [IU] | ORAL_CAPSULE | ORAL | 1 refills | Status: DC

## 2021-04-22 NOTE — Telephone Encounter (Signed)
Test result discussed.  Vitamin D is low.  I recommended weekly supplementation and f/u with PCP in 8 weeks for retesting. May transition to OTC daily supplementation if her Vit D level is close to 30. She verbalized understanding.

## 2021-06-24 ENCOUNTER — Ambulatory Visit (INDEPENDENT_AMBULATORY_CARE_PROVIDER_SITE_OTHER): Payer: BC Managed Care – PPO | Admitting: Neurology

## 2021-06-24 DIAGNOSIS — G5603 Carpal tunnel syndrome, bilateral upper limbs: Secondary | ICD-10-CM

## 2021-06-24 DIAGNOSIS — Z0289 Encounter for other administrative examinations: Secondary | ICD-10-CM

## 2021-06-24 NOTE — Procedures (Signed)
? ? ? ?   ?Full Name: [name] Gender: Female ?MRN #: [id number] Date of Birth: Oct 26, 1985 ?   ?Visit Date: 06/24/2021 09:18 ?Age: 36 Years ?Examining Physician: Marcial Pacas, MD  ?Referring Physician: Andrena Mews, MD ?History: 36 year old right-handed female complains of worsening bilateral hands paresthesia during her pregnancy, right worse than left, not much improved post partum, but still intermittently symptomatic ? ?Summary of the test: ?Nerve conduction study: ? ?Bilateral median sensory responses showed moderate to mild prolonged peak latency with normal snap amplitude, right worse than left. ? ?Right median motor response showed moderately prolonged distal latency with normal CMAP amplitude, conduction velocity. ? ?Left median motor response showed borderline prolonged distal latency with normal CMAP amplitude, conduction velocity. ? ?Bilateral ulnar sensory and motor responses were normal. ? ?Electromyography: ? ?Selected needle examination of bilateral upper extremity showed no significant abnormality, with exception of mildly increased insertional activity at bilateral abductor pollicis brevis, with prolonged enlarged complex motor unit potentials mildly decreased recruitment, right worse than left. ? ?Conclusion: ? ?This is an abnormal study.  There is electrodiagnostic evidence of bilateral median neuropathy across the wrist, consistent with moderate bilateral carpal tunnel syndromes, right worse than left, demyelinating in nature, no evidence of axonal loss. ? ? ?------------------------------- ?Marcial Pacas M.D. PhD ? ?[facility] ?[address], [address] ?[place], [place] [address] ?Tel: [phone] ?Fax: [phone] ? ?Verbal informed consent was obtained from the patient, patient was informed of potential risk of procedure, including bruising, bleeding, hematoma formation, infection, muscle weakness, muscle pain, numbness, among others. ?   ? ?   ?[place] ?   ?Nerve / Sites  Muscle Latency Ref. Amplitude Ref. Rel Amp Segments Distance Velocity Ref. Area  ?  ms ms mV mV %  cm m/s m/s mVms  ?L Median - APB  ?   Wrist APB 4.4 ?4.4 12.0 ?4.0 100 Wrist - APB 7   48.1  ?   Upper arm APB 8.8  11.8  98.5 Upper arm - Wrist 22 51 ?49 47.7  ?R Median - APB  ?   Wrist APB 5.9 ?4.4 10.3 ?4.0 100 Wrist - APB 7   40.3  ?   Upper arm APB 10.3  9.7  93.9 Upper arm - Wrist 22 51 ?49 37.7  ?L Ulnar - ADM  ?   Wrist ADM 2.9 ?3.3 12.2 ?6.0 100 Wrist - ADM 7   41.3  ?   B.Elbow ADM 6.3  10.2  84 B.Elbow - Wrist 19 56 ?49 37.5  ?   A.Elbow ADM 8.3  10.4  101 A.Elbow - B.Elbow 10 52 ?49 38.4  ?R Ulnar - ADM  ?   Wrist ADM 2.8 ?3.3 10.3 ?6.0 100 Wrist - ADM 7   40.5  ?   B.Elbow ADM 5.9  10.3  100 B.Elbow - Wrist 20 64 ?49 42.1  ?   A.Elbow ADM 7.6  10.1  97.9 A.Elbow - B.Elbow 10 60 ?49 41.0  ?           ?[place] ?   ?Nerve / Sites Rec. Site Peak Lat Ref.  Amp Ref. Segments Distance  ?  ms ms ?V ?V  cm  ?L Median - Orthodromic (Dig II, Mid palm)  ?   Dig II Wrist 3.8 ?3.4 18 ?10 Dig II - Wrist 13  ?R Median - Orthodromic (Dig II, Mid palm)  ?   Dig II Wrist 4.6 ?3.4 10 ?10 Dig II - Wrist 13  ?  L Ulnar - Orthodromic, (Dig V, Mid palm)  ?   Dig V Wrist 2.9 ?3.1 8 ?5 Dig V - Wrist 11  ?R Ulnar - Orthodromic, (Dig V, Mid palm)  ?   Dig V Wrist 2.6 ?3.1 9 ?5 Dig V - Wrist 11  ?           ?F  Wave ?   ?Nerve F Lat Ref.  ? ms ms  ?L Ulnar - ADM 27.7 ?32.0  ?R Ulnar - ADM 27.0 ?32.0  ?       ?EMG Summary Table   ? Spontaneous MUAP Recruitment  ?Muscle IA Fib PSW Fasc Other Amp Dur. Poly Pattern  ?R. Abductor pollicis brevis Normal None None None _______ Normal Normal Normal Reduced  ?R. Pronator teres Normal None None None _______ Normal Normal Normal Normal  ?R. Biceps brachii Normal None None None _______ Normal Normal Normal Normal  ?R. Triceps brachii Normal None None None _______ Normal Normal Normal Normal  ?R. First dorsal interosseous Normal None None None _______ Normal Normal Normal Normal  ?L. First dorsal  interosseous Normal None None None _______ Normal Normal Normal Normal  ?L. Pronator teres Normal None None None _______ Normal Normal Normal Normal  ?L. Biceps brachii Normal None None None _______ Normal Normal Normal Normal  ?L. Deltoid Normal None None None _______ Normal Normal Normal Normal  ?L. Triceps brachii Normal None None None _______ Normal Normal Normal Normal  ?L. Brachioradialis Normal None None None _______ Normal Normal Normal Normal  ?L. Abductor pollicis brevis Normal None None None _______ Normal Normal Normal Reduced  ? ?  ?

## 2021-09-08 ENCOUNTER — Encounter: Payer: Self-pay | Admitting: *Deleted

## 2022-05-03 ENCOUNTER — Encounter: Payer: Self-pay | Admitting: Family Medicine

## 2022-05-03 ENCOUNTER — Encounter: Payer: BC Managed Care – PPO | Admitting: Family Medicine

## 2022-05-03 ENCOUNTER — Ambulatory Visit (INDEPENDENT_AMBULATORY_CARE_PROVIDER_SITE_OTHER): Payer: BC Managed Care – PPO | Admitting: Family Medicine

## 2022-05-03 ENCOUNTER — Other Ambulatory Visit (HOSPITAL_COMMUNITY)
Admission: RE | Admit: 2022-05-03 | Discharge: 2022-05-03 | Disposition: A | Discharge status: Home / Self Care | Payer: BC Managed Care – PPO | Source: Ambulatory Visit | Attending: Family Medicine | Admitting: Family Medicine

## 2022-05-03 VITALS — BP 110/76 | HR 69 | Ht 66.0 in | Wt 194.0 lb

## 2022-05-03 DIAGNOSIS — Z6831 Body mass index (BMI) 31.0-31.9, adult: Secondary | ICD-10-CM

## 2022-05-03 DIAGNOSIS — R7989 Other specified abnormal findings of blood chemistry: Secondary | ICD-10-CM

## 2022-05-03 DIAGNOSIS — Z1331 Encounter for screening for depression: Secondary | ICD-10-CM | POA: Insufficient documentation

## 2022-05-03 DIAGNOSIS — O99280 Endocrine, nutritional and metabolic diseases complicating pregnancy, unspecified trimester: Secondary | ICD-10-CM

## 2022-05-03 DIAGNOSIS — E0590 Thyrotoxicosis, unspecified without thyrotoxic crisis or storm: Secondary | ICD-10-CM

## 2022-05-03 DIAGNOSIS — Z23 Encounter for immunization: Secondary | ICD-10-CM

## 2022-05-03 DIAGNOSIS — E559 Vitamin D deficiency, unspecified: Secondary | ICD-10-CM

## 2022-05-03 DIAGNOSIS — Z113 Encounter for screening for infections with a predominantly sexual mode of transmission: Secondary | ICD-10-CM

## 2022-05-03 DIAGNOSIS — D649 Anemia, unspecified: Secondary | ICD-10-CM | POA: Diagnosis not present

## 2022-05-03 DIAGNOSIS — D509 Iron deficiency anemia, unspecified: Secondary | ICD-10-CM

## 2022-05-03 NOTE — Assessment & Plan Note (Addendum)
Tolerated COVID booster well without adverse side effects.

## 2022-05-03 NOTE — Assessment & Plan Note (Signed)
Will collect vitamin D level today.

## 2022-05-03 NOTE — Patient Instructions (Signed)
It was wonderful to meet you today. Thank you for allowing me to be a part of your care. Below is a short summary of what we discussed at your visit today:  Physical exam Today we reviewed all of your health history, including past medical conditions, medications, past surgeries, and allergies.  We also reviewed your vaccine status and necessary screenings, those are discussed below.  Labs Today we obtained quite a few labs to evaluate how you are doing currently.  We are checking your blood cell counts, blood electrolytes, cholesterol, blood sugar, and markers of liver and kidney function.  If everything is normal, we will send you letter.  If anything is out of the ordinary, I will give you call with the results and plan going forward.  Vaccines Today you received the annual flu vaccine and COVID booster.  You may experience some residual soreness at the injection site.  Gentle stretches and regular use of that arm will help speed up your recovery.  As the vaccines are giving your immune system a "practice run" against specific infections, you may feel a little under the weather for the next several days.  We recommend rest as needed and hydrating.  Screenings Hepatitis C and HIV: We recommend every adult is screened for HIV and hepatitis at least once in a lifetime.  You have already been screened, so we will not need to order these at this time.  PAP Smear: You are up-to-date on your Pap smear tests and will not need one until 2027.  We will schedule an appointment for this much closer to the due date.  Mammograms: As you are 37 y.o. and without family history of breast cancer, we will start screening you for breast cancer with mammograms at 27-45 years of age.   Colonoscopy: As you are 37 y.o. and without family history of colon cancer, you will need to start colon cancer screenings with colonoscopies at 32-61 years of age.    Please bring all of your medications to every appointment!  If  you have any questions or concerns, please do not hesitate to contact us via phone or MyChart message.   Ezequiel Essex, MD

## 2022-05-03 NOTE — Assessment & Plan Note (Signed)
Will recheck TSH today.  Not currently on medication.

## 2022-05-03 NOTE — Assessment & Plan Note (Signed)
Tolerated well without adverse side effects.

## 2022-05-03 NOTE — Progress Notes (Deleted)
    SUBJECTIVE:   CHIEF COMPLAINT / HPI:   Annual physical  [name] is a pleasant 37 y.o. woman who presents for her annual physical exam.  Medications and allergies updated.  Vaccines due: COVID booster Screenings due: *** Pap: *** Mammogram: *** Colonoscopy: *** Lung CT: *** DEXA: ***   PERTINENT  PMH / PSH:  Patient Active Problem List   Diagnosis Date Noted   Need for first booster dose of COVID-19 vaccine 05/03/2022   Influenza vaccination given 05/03/2022   Bilateral carpal tunnel syndrome 06/24/2021   Paresthesia 04/21/2021   Vitamin D deficiency 04/21/2021   Leg cramp 04/21/2021   History of cesarean section 01/07/2021   Cesarean delivery delivered 01/07/2021   Hx of myomectomy 01/07/2021   Previous cesarean delivery affecting pregnancy, antepartum 08/06/2020   Microcytic anemia 07/22/2020   Hyperthyroidism during pregnancy 07/21/2020   History of prior pregnancy with short cervix, currently pregnant 07/09/2020   AMA (advanced maternal age) multigravida 35+ 07/09/2020   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   Influenza vaccination given Tolerated well without adverse side effects.      Ezequiel Essex, MD [place]

## 2022-05-03 NOTE — Progress Notes (Signed)
SUBJECTIVE:   CHIEF COMPLAINT / HPI:   Annual physical  [name] is a pleasant 37 y.o. woman who presents for her annual physical exam.  Medications and allergies updated.  Vaccines due: COVID and flu Screenings due: None Pap: 11/05/2020, NILM, HPV negative  STI screening Patient also requests STI screening today.  She is absolutely no symptoms, denies abnormal vaginal discharge, odor, pelvic or vaginal pain.  She has no concern for specific exposure.  She has has not had any STI testing since her pregnancy and would like something.  Weight loss desired Reports difficulty losing weight after her baby was born last year.  She would like to lose about 40 pounds she has not tried any diet or exercise plans.  Not quite sure what to start.  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   Fibroids 05/03/2013     OBJECTIVE:   BP 110/76   Pulse 69   Ht '5\' 6"'$  (1.676 m)   Wt 194 lb (88 kg)   LMP 04/23/2022   SpO2 99%   BMI 31.31 kg/m    PHQ-9:     05/03/2022   11:30 AM 04/21/2021   11:24 AM 10/30/2020   10:48 AM  Depression screen PHQ 2/9  Decreased Interest 0 0 0  Down, Depressed, Hopeless 0 0 0  PHQ - 2 Score 0 0 0  Altered sleeping 0 0 0  Tired, decreased energy 0 0 0  Change in appetite 0 0 0  Feeling bad or failure about yourself  0 0 0  Trouble concentrating 0 0 0  Moving slowly or fidgety/restless 0 0 0   Suicidal thoughts 0 0 0  PHQ-9 Score 0 0 0  Difficult doing work/chores Not difficult at all Not difficult at all     Physical Exam General: Awake, alert, oriented HEENT: PERRL, bilateral TM pearly pink and flat, bilateral external auditory canals with minimal cerumen burden, no lesions, nasal mucosa slightly edematous, oral mucosa pink, moist, without lesion, intact dentition without obvious cavity Lymph: No palpable lymphedema of head or neck, thyroid unremarkable to palpation Cardiovascular: Regular rate and rhythm, S1 and S2 present, no murmurs auscultated Respiratory: Lung fields clear to auscultation bilaterally  ASSESSMENT/PLAN:   Need for first booster dose of COVID-19 vaccine Tolerated COVID booster well without adverse side effects.   Influenza vaccination given Tolerated well without adverse side effects.   Hyperthyroidism during pregnancy Will recheck TSH today.  Not currently on medication.  Microcytic anemia Will recheck CBC.  Routine screening for STI (sexually transmitted infection) Vaginal swab and blood work to test for gonorrhea, chlamydia, trichomonas, HIV, and RPR.  Vitamin D deficiency Will collect vitamin D level today.  BMI 31.0-31.9,adult Patient desires weight loss.  Discussed different approaches, she is amenable to referral to healthy weight and wellness clinic.  Referral sent.  Provided patient with phone number and instructed to call their clinic directly to schedule.  See AVS for more.  Ezequiel Essex, MD [place]

## 2022-05-03 NOTE — Assessment & Plan Note (Signed)
Will recheck CBC

## 2022-05-03 NOTE — Assessment & Plan Note (Signed)
Patient desires weight loss.  Discussed different approaches, she is amenable to referral to healthy weight and wellness clinic.  Referral sent.  Provided patient with phone number and instructed to call their clinic directly to schedule.  See AVS for more.

## 2022-05-03 NOTE — Assessment & Plan Note (Signed)
Vaginal swab and blood work to test for gonorrhea, chlamydia, trichomonas, HIV, and RPR.

## 2022-05-04 LAB — LIPID PANEL
Chol/HDL Ratio: 2.8 ratio (ref 0.0–4.4)
Cholesterol, Total: 196 mg/dL (ref 100–199)
HDL: 69 mg/dL (ref >39)
LDL Chol Calc (NIH): 119 mg/dL — ABNORMAL HIGH (ref 0–99)
Triglycerides: 41 mg/dL (ref 0–149)
VLDL Cholesterol Cal: 8 mg/dL (ref 5–40)

## 2022-05-04 LAB — BASIC METABOLIC PANEL
BUN/Creatinine Ratio: 17 (ref 9–23)
BUN: 11 mg/dL (ref 6–20)
CO2: 18 mmol/L — ABNORMAL LOW (ref 20–29)
Calcium: 9.1 mg/dL (ref 8.7–10.2)
Chloride: 104 mmol/L (ref 96–106)
Creatinine, Ser: 0.65 mg/dL (ref 0.57–1.00)
Glucose: 70 mg/dL (ref 70–99)
Potassium: 4 mmol/L (ref 3.5–5.2)
Sodium: 142 mmol/L (ref 134–144)
eGFR: 115 mL/min/{1.73_m2} (ref >59)

## 2022-05-04 LAB — CERVICOVAGINAL ANCILLARY ONLY
Chlamydia: NEGATIVE
Comment: NEGATIVE
Comment: NEGATIVE
Comment: NORMAL
Neisseria Gonorrhea: NEGATIVE
Trichomonas: NEGATIVE

## 2022-05-04 LAB — HEMOGLOBIN A1C
Est. average glucose Bld gHb Est-mCnc: 117 mg/dL
Hgb A1c MFr Bld: 5.7 % — ABNORMAL HIGH (ref 4.8–5.6)

## 2022-05-04 LAB — CBC
Hematocrit: 37.3 % (ref 34.0–46.6)
Hemoglobin: 12.3 g/dL (ref 11.1–15.9)
MCH: 28.5 pg (ref 26.6–33.0)
MCHC: 33 g/dL (ref 31.5–35.7)
MCV: 86 fL (ref 79–97)
Platelets: 212 10*3/uL (ref 150–450)
RBC: 4.32 x10E6/uL (ref 3.77–5.28)
RDW: 12.3 % (ref 11.7–15.4)
WBC: 4.6 10*3/uL (ref 3.4–10.8)

## 2022-05-04 LAB — RPR: RPR Ser Ql: NONREACTIVE

## 2022-05-04 LAB — HIV ANTIBODY (ROUTINE TESTING W REFLEX): HIV Screen 4th Generation wRfx: NONREACTIVE

## 2022-05-04 LAB — TSH: TSH: 0.572 u[IU]/mL (ref 0.450–4.500)

## 2022-05-04 LAB — VITAMIN D 25 HYDROXY (VIT D DEFICIENCY, FRACTURES): Vit D, 25-Hydroxy: 17.8 ng/mL — ABNORMAL LOW (ref 30.0–100.0)

## 2022-05-05 ENCOUNTER — Encounter: Payer: Self-pay | Admitting: Family Medicine

## 2022-05-05 ENCOUNTER — Telehealth: Payer: Self-pay | Admitting: Family Medicine

## 2022-05-05 DIAGNOSIS — E7800 Pure hypercholesterolemia, unspecified: Secondary | ICD-10-CM | POA: Insufficient documentation

## 2022-05-05 DIAGNOSIS — R7303 Prediabetes: Secondary | ICD-10-CM | POA: Insufficient documentation

## 2022-05-05 DIAGNOSIS — E559 Vitamin D deficiency, unspecified: Secondary | ICD-10-CM

## 2022-05-05 MED ORDER — VITAMIN D (ERGOCALCIFEROL) 1.25 MG (50000 UNIT) PO CAPS: 50000.0000 [IU] | ORAL_CAPSULE | ORAL | 0 refills | Status: DC

## 2022-05-05 NOTE — Telephone Encounter (Signed)
Called patient to discuss annual physical labs. A1c now pre-diabetic. LDL elevated at 119. Vit. D low.    All other labs unremarkable, negative STI screenings.   Plan  A1c and LDL: diet and exercise, recheck 3-6 months Vit. D: supplementation daily. Recheck in 3 months  Discussed diet changes. Recommend walking 3 times weekly. Recheck labs in about 3 months.   Ezequiel Essex, MD

## 2022-05-10 ENCOUNTER — Ambulatory Visit (INDEPENDENT_AMBULATORY_CARE_PROVIDER_SITE_OTHER): Payer: BC Managed Care – PPO | Admitting: Internal Medicine

## 2022-05-10 ENCOUNTER — Encounter (INDEPENDENT_AMBULATORY_CARE_PROVIDER_SITE_OTHER): Payer: Self-pay | Admitting: Internal Medicine

## 2022-05-10 VITALS — BP 115/76 | HR 77 | Temp 98.2°F | Ht 66.0 in

## 2022-05-10 DIAGNOSIS — E7800 Pure hypercholesterolemia, unspecified: Secondary | ICD-10-CM

## 2022-05-10 DIAGNOSIS — E559 Vitamin D deficiency, unspecified: Secondary | ICD-10-CM

## 2022-05-10 DIAGNOSIS — Z6831 Body mass index (BMI) 31.0-31.9, adult: Secondary | ICD-10-CM

## 2022-05-10 DIAGNOSIS — R7303 Prediabetes: Secondary | ICD-10-CM | POA: Diagnosis not present

## 2022-05-10 DIAGNOSIS — E669 Obesity, unspecified: Secondary | ICD-10-CM | POA: Diagnosis not present

## 2022-05-10 DIAGNOSIS — Z0289 Encounter for other administrative examinations: Secondary | ICD-10-CM

## 2022-05-10 NOTE — Progress Notes (Signed)
Office: [phone]  /  Fax: [phone]   Initial Visit  [name] was seen in clinic today to evaluate for obesity. She is interested in losing weight to improve overall health and reduce the risk of weight related complications. She presents today to review program treatment options, initial physical assessment, and evaluation.     She was referred by: PCP  When asked what else they would like to accomplish? She states: Adopt healthier eating patterns, Improve energy levels and physical activity, Improve existing medical conditions, and Improve quality of life  When asked how has your weight affected you? She states: Contributed to medical problems, Having fatigue, and Having poor endurance  Some associated conditions: Hyperlipidemia and Prediabetes  Contributing factors: Family history, Stress, Reduced physical activity, and Eating patterns  Weight promoting medications identified: None  Current nutrition plan: None  Current level of physical activity: None  Current or previous pharmacotherapy: None  Response to medication: Never tried medications   Past medical history includes:   Past Medical History:  Diagnosis Date   Anemia    Fibroid    Fibroids    History of prior pregnancy with short cervix, currently pregnant 07/09/2020   Tx with progesterone   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 06/12/2020          Nursing Staff  Provider  Office Location   [facility]   Dating   LMP  Language   English   Anatomy US   09-03-20  Flu Vaccine      Genetic Screen   NIPS:   AFP:   First Screen:  Quad:      TDaP vaccine    10-30-20  Hgb A1C or   GTT  Early 5.4  Third trimester   Rhogam         LAB RESULTS   Feeding Plan  Breast/Bottle  Blood Type  O/Positive/-- (03/10 1030)   Contraception  undecided  Antibody  Negati     Objective:   BP 115/76   Pulse 77   Temp 98.2 F (36.8 C)   Ht '5\' 6"'$  (1.676 m)   LMP 04/23/2022   SpO2 100%   BMI 31.31 kg/m  She was  weighed on the bioimpedance scale: Body mass index is 31.31 kg/m.  Peak Weight:194 , Body Fat%:39, Visceral Fat Rating:8, Weight trend over the last 12 months: Increasing  General:  Alert, oriented and cooperative. Patient is in no acute distress.  Respiratory: Normal respiratory effort, no problems with respiration noted  Extremities: Normal range of motion.    Mental Status: Normal mood and affect. Normal behavior. Normal judgment and thought content.   DIAGNOSTIC DATA REVIEWED:  BMET    Component Value Date/Time   NA 142 05/03/2022 1636   K 4.0 05/03/2022 1636   CL 104 05/03/2022 1636   CO2 18 (L) 05/03/2022 1636   GLUCOSE 70 05/03/2022 1636   GLUCOSE 103 (H) 01/14/2017 0442   BUN 11 05/03/2022 1636   CREATININE 0.65 05/03/2022 1636   CALCIUM 9.1 05/03/2022 1636   GFRNONAA 114 04/07/2018 1433   GFRAA 132 04/07/2018 1433   Lab Results  Component Value Date   HGBA1C 5.7 (H) 05/03/2022   HGBA1C 5.4 07/09/2020   No results found for: "INSULIN" CBC    Component Value Date/Time   WBC 4.6 05/03/2022 1636   WBC 8.3 01/05/2021 1132   RBC 4.32 05/03/2022 1636   RBC 3.37 (L) 01/05/2021 1132   HGB 12.3 05/03/2022 1636  HGB 11.4 02/08/2013 0000   HCT 37.3 05/03/2022 1636   HCT 33 02/08/2013 0000   PLT 212 05/03/2022 1636   PLT 174 02/08/2013 0000   MCV 86 05/03/2022 1636   MCH 28.5 05/03/2022 1636   MCH 26.7 01/05/2021 1132   MCHC 33.0 05/03/2022 1636   MCHC 32.0 01/05/2021 1132   RDW 12.3 05/03/2022 1636   Iron/TIBC/Ferritin/ %Sat    Component Value Date/Time   IRON 63 04/21/2021 1147   TIBC 311 04/21/2021 1147   FERRITIN 40 04/21/2021 1147   IRONPCTSAT 20 04/21/2021 1147   Lipid Panel     Component Value Date/Time   CHOL 196 05/03/2022 1636   TRIG 41 05/03/2022 1636   HDL 69 05/03/2022 1636   CHOLHDL 2.8 05/03/2022 1636   LDLCALC 119 (H) 05/03/2022 1636   Hepatic Function Panel     Component Value Date/Time   PROT 6.9 07/09/2020 1609   ALBUMIN 4.2  07/09/2020 1609   AST 27 07/09/2020 1609   ALT 26 07/09/2020 1609   ALKPHOS 44 07/09/2020 1609   BILITOT 0.4 07/09/2020 1609      Component Value Date/Time   TSH 0.572 05/03/2022 1636     Assessment and Plan:  1. Prediabetes Most recent A1c is  Lab Results  Component Value Date   HGBA1C 5.7 (H) 05/03/2022   with associated elevated insulin levels.  Patient informed of disease state and risk of progression. This may contribute to abnormal cravings, fatigue and diabetes complications without having diabetes.   We reviewed treatment options which include weight loss of about 7 to 10% of body weight, increasing physical activity to 150 minutes a week of moderate intensity and pharmacotherapy with metformin.    2. Elevated LDL cholesterol level LDL mildly elevated at 119.  This might be secondary to a spillover effect from adiposity.  Recommended LDL goal is less <70 to reduce the future risk of fatty streaks and the progression to obstructive ASCVD.  Her 10 year risk is: The ASCVD Risk score (Arnett DK, et al., 2019) failed to calculate for the following reasons:   The 2019 ASCVD risk score is only valid for ages 11 to 45  Lab Results  Component Value Date   CHOL 196 05/03/2022   HDL 69 05/03/2022   LDLCALC 119 (H) 05/03/2022   TRIG 41 05/03/2022   CHOLHDL 2.8 05/03/2022    Patient will work on losing weight.  Weight loss of 10% may improve LDL cholesterol.  No need for statin therapy at present time.     3. Vitamin D deficiency Most recent vitamin D levels  Lab Results  Component Value Date   VD25OH 17.8 (L) 05/03/2022   VD25OH 12.9 (L) 04/21/2021     Deficiency state associated with adiposity and may result in leptin resistance, weight gain and fatigue. Currently on vitamin D supplementation without any adverse effects.  Check vitamin D levels with intake labs for goal level of 50-60.  4. Class 1 obesity with serious comorbidity and body mass index (BMI) of 31.0 to  31.9 in adult, unspecified obesity type We reviewed weight, biometrics, associated medical conditions and contributing factors with patient. She would benefit from weight loss therapy via a modified calorie, low-carb, high-protein nutritional plan tailored to their REE (resting energy expenditure) which will be determined by indirect calorimetry.  We will also assess for cardiometabolic risk and nutritional derangements via fasting serologies at her next appointment.       Obesity Treatment / Action  Plan:  Patient will work on garnering support from family and friends to begin weight loss journey. Will work on eliminating or reducing the presence of highly palatable, calorie dense foods in the home. Will complete provided nutritional and psychosocial assessment questionnaire before the next appointment. Will be scheduled for indirect calorimetry to determine resting energy expenditure in a fasting state.  This will allow [place] to create a reduced calorie, high-protein meal plan to promote loss of fat mass while preserving muscle mass. Counseled on the health benefits of losing 5%-15% of total body weight. Was counseled on nutritional approaches to weight loss and benefits of complex carbs and high quality protein as part of nutritional weight management. Was counseled on pharmacotherapy and role as an adjunct in weight management.   Obesity Education Performed Today:  She was weighed on the bioimpedance scale and results were discussed and documented in the synopsis.  We discussed obesity as a disease and the importance of a more detailed evaluation of all the factors contributing to the disease.  We discussed the importance of long term lifestyle changes which include nutrition, exercise and behavioral modifications as well as the importance of customizing this to her specific health and social needs.  We discussed the benefits of reaching a healthier weight to alleviate the symptoms of  existing conditions and reduce the risks of the biomechanical, metabolic and psychological effects of obesity.  Lindia Youdom Robles appears to be in the action stage of change and states they are ready to start intensive lifestyle modifications and behavioral modifications.  30 minutes was spent today on this visit including the above counseling, pre-visit chart review, and post-visit documentation.  Reviewed by clinician on day of visit: allergies, medications, problem list, medical history, surgical history, family history, social history, and previous encounter notes.    I have reviewed the above documentation for accuracy and completeness, and I agree with the above.  [place]

## 2022-06-08 ENCOUNTER — Encounter (INDEPENDENT_AMBULATORY_CARE_PROVIDER_SITE_OTHER): Payer: Self-pay | Admitting: Internal Medicine

## 2022-06-08 ENCOUNTER — Ambulatory Visit (INDEPENDENT_AMBULATORY_CARE_PROVIDER_SITE_OTHER): Payer: BC Managed Care – PPO | Admitting: Internal Medicine

## 2022-06-08 VITALS — BP 107/71 | HR 66 | Temp 98.1°F | Ht 66.0 in | Wt 186.0 lb

## 2022-06-08 DIAGNOSIS — E559 Vitamin D deficiency, unspecified: Secondary | ICD-10-CM

## 2022-06-08 DIAGNOSIS — R7303 Prediabetes: Secondary | ICD-10-CM

## 2022-06-08 DIAGNOSIS — R5383 Other fatigue: Secondary | ICD-10-CM

## 2022-06-08 DIAGNOSIS — E7800 Pure hypercholesterolemia, unspecified: Secondary | ICD-10-CM | POA: Diagnosis not present

## 2022-06-08 DIAGNOSIS — R0602 Shortness of breath: Secondary | ICD-10-CM

## 2022-06-08 DIAGNOSIS — Z1331 Encounter for screening for depression: Secondary | ICD-10-CM

## 2022-06-08 DIAGNOSIS — E669 Obesity, unspecified: Secondary | ICD-10-CM

## 2022-06-08 DIAGNOSIS — E668 Other obesity: Secondary | ICD-10-CM

## 2022-06-08 DIAGNOSIS — Z6830 Body mass index (BMI) 30.0-30.9, adult: Secondary | ICD-10-CM

## 2022-06-08 NOTE — Assessment & Plan Note (Signed)
Her most recent LDL was elevated.  This was done nonfasting.  She is fasting this morning so we will check fasting lipid profile in a fasting state.

## 2022-06-08 NOTE — Progress Notes (Unsigned)
Chief Complaint:   OBESITY [name] (MR# [id number]) is a 37 y.o. female who presents for evaluation and treatment of obesity and related comorbidities. Current BMI is Body mass index is 30.02 kg/m. [name] has been struggling with her weight for many years and has been unsuccessful in either losing weight, maintaining weight loss, or reaching her healthy weight goal.  [name] is currently in the action stage of change and ready to dedicate time achieving and maintaining a healthier weight. [name] is interested in becoming our patient and working on intensive lifestyle modifications including (but not limited to) diet and exercise for weight loss.  [name]'s habits were reviewed today and are as follows: Her family eats meals together, she thinks her family will eat healthier with her, her desired weight loss is 26 lbs, she started gaining weight after having children, she is a picky eater and doesn't like to eat healthier foods, she has significant food cravings issues, she skips meals frequently, and she is frequently drinking liquids with calories.  Depression Screen [name]'s Food and Mood (modified PHQ-9) score was 1.  Subjective:   1. Other fatigue [name] admits to daytime somnolence and denies waking up still tired. Patient has a history of symptoms of daytime fatigue. [name] generally gets 7 or 8 hours of sleep per night, and states that she has generally restful sleep. Snoring DID NOT ANSWER present. Apneic episodes are not present. Epworth Sleepiness Score is 8.   2. SOB (shortness of breath) on exertion [name] notes increasing shortness of breath with exercising and seems to be worsening over time with weight gain. She notes getting out of breath sooner with activity than she used to. This has gotten worse recently. [name] denies shortness of breath at rest or orthopnea.  3. Prediabetes Most recent A1c is 5.7.  4. Elevated LDL cholesterol level Her most recent LDL was elevated.  This was done nonfasting.  5. Vitamin D deficiency Most recent vitamin D levels 17.8 and 12.9. Deficiency state associated with adiposity and may result in leptin resistance, weight gain and fatigue. Currently on vitamin D supplementation 5000 iu a day without any adverse effects.  Assessment/Plan:   1. Other fatigue [name] does feel that her weight is causing her energy to be lower than it should be. Fatigue may be related to obesity, depression or many other causes. Labs will be ordered, and in the meanwhile, [name] will focus on self care including making healthy food choices, increasing physical activity and focusing on stress reduction. - EKG 12-Lead - Vitamin B12  2. SOB (shortness of breath) on exertion [name] does feel that she gets out of breath more easily that she used to when she exercises. [name]'s shortness of breath appears to be obesity related and exercise induced. She has agreed to work on weight loss and gradually increase exercise to treat her exercise induced shortness of breath. Will continue to monitor closely.  3. Prediabetes Patient informed of disease state and risk of progression. This may contribute to abnormal cravings, fatigue and diabetes complications without having diabetes.   We reviewed treatment options which include losing 7 to 10% of body weight, increasing physical activity to a 150 minutes a week of moderate intensity.She may also be a candidate for pharmacoprophylaxis with metformin or incretin mimetic.  We are checking fasting blood glucose and insulin levels today.  Lab/Orders: - Comprehensive metabolic panel - Insulin, random  4. Elevated LDL cholesterol level She is fasting this morning so  we will check fasting lipid profile in a fasting state.  Lab/Orders: - Lipid Panel With LDL/HDL Ratio  5. Vitamin D deficiency Continue vitamin D at current dose.  Check levels at 3 months which will be in May.  6. Depression screen [name] had a negative  depression screening. Depression is commonly associated with obesity and often results in emotional eating behaviors. We will monitor this closely and work on CBT to help improve the non-hunger eating patterns. Referral to Psychology may be required if no improvement is seen as she continues in our clinic.  7. Class 1 obesity with serious comorbidity and body mass index (BMI) of 31.0 to 31.9 in adult, unspecified obesity type  [name] is currently in the action stage of change and her goal is to continue with weight loss efforts. I recommend [name] begin the structured treatment plan as follows:  She has agreed to keeping a food journal and adhering to recommended goals of 1200 calories and 100-110 grams protein.  Exercise goals: All adults should avoid inactivity. Some physical activity is better than none, and adults who participate in any amount of physical activity gain some health benefits.   Behavioral modification strategies: increasing lean protein intake, decreasing simple carbohydrates, increasing vegetables, increasing water intake, decreasing liquid calories, increasing high fiber foods, no skipping meals, meal planning and cooking strategies, keeping healthy foods in the home, better snacking choices, avoiding temptations, and planning for success.  She was informed of the importance of frequent follow-up visits to maximize her success with intensive lifestyle modifications for her multiple health conditions. She was informed we would discuss her lab results at her next visit unless there is a critical issue that needs to be addressed sooner. [name] agreed to keep her next visit at the agreed upon time to discuss these results.  Objective:   Blood pressure 107/71, pulse 66, temperature 98.1 F (36.7 C), height '5\' 6"'$  (1.676 m), weight 186 lb (84.4 kg), SpO2 100 %. Body mass index is 30.02 kg/m.  EKG: Normal sinus rhythm, rate 68.  Indirect Calorimeter completed today shows a VO2 of 217 and  a REE of 1498.  Her calculated basal metabolic rate is 99991111 thus her basal metabolic rate is worse than expected.  General: Cooperative, alert, well developed, in no acute distress. HEENT: Conjunctivae and lids unremarkable. Cardiovascular: Regular rhythm.  Lungs: Normal work of breathing. Neurologic: No focal deficits.   Lab Results  Component Value Date   CREATININE 0.69 06/08/2022   BUN 7 06/08/2022   NA 138 06/08/2022   K 3.9 06/08/2022   CL 101 06/08/2022   CO2 18 (L) 06/08/2022   Lab Results  Component Value Date   ALT 26 06/08/2022   AST 25 06/08/2022   ALKPHOS 57 06/08/2022   BILITOT 0.4 06/08/2022   Lab Results  Component Value Date   HGBA1C 5.7 (H) 05/03/2022   HGBA1C 5.4 07/09/2020   Lab Results  Component Value Date   INSULIN 6.6 06/08/2022   Lab Results  Component Value Date   TSH 0.572 05/03/2022   Lab Results  Component Value Date   CHOL 227 (H) 06/08/2022   HDL 57 06/08/2022   LDLCALC 153 (H) 06/08/2022   TRIG 94 06/08/2022   CHOLHDL 2.8 05/03/2022   Lab Results  Component Value Date   WBC 4.6 05/03/2022   HGB 12.3 05/03/2022   HCT 37.3 05/03/2022   MCV 86 05/03/2022   PLT 212 05/03/2022   Lab Results  Component Value Date  IRON 63 04/21/2021   TIBC 311 04/21/2021   FERRITIN 40 04/21/2021    Attestation Statements:   Reviewed by clinician on day of visit: allergies, medications, problem list, medical history, surgical history, family history, social history, and previous encounter notes.  Time spent on visit including pre-visit chart review and post-visit charting and care was 40 minutes.   I, Kathlene November, BS, CMA, am acting as transcriptionist for Thomes Dinning, MD.  I have reviewed the above documentation for accuracy and completeness, and I agree with the above. -Thomes Dinning, MD

## 2022-06-08 NOTE — Assessment & Plan Note (Signed)
Most recent vitamin D levels  Lab Results  Component Value Date   VD25OH 17.8 (L) 05/03/2022   VD25OH 12.9 (L) 04/21/2021     Deficiency state associated with adiposity and may result in leptin resistance, weight gain and fatigue. Currently on vitamin D supplementation 5000 iu a day without any adverse effects.  Plan:Continue vitamin D at current dose.  Check levels at 3 months which will be in May.

## 2022-06-08 NOTE — Assessment & Plan Note (Signed)
Most recent A1c is  Lab Results  Component Value Date   HGBA1C 5.7 (H) 05/03/2022  . Patient informed of disease state and risk of progression. This may contribute to abnormal cravings, fatigue and diabetes complications without having diabetes.   We reviewed treatment options which include losing 7 to 10% of body weight, increasing physical activity to a 150 minutes a week of moderate intensity.She may also be a candidate for pharmacoprophylaxis with metformin or incretin mimetic.  We are checking fasting blood glucose and insulin levels today.

## 2022-06-10 LAB — COMPREHENSIVE METABOLIC PANEL
ALT: 26 IU/L (ref 0–32)
AST: 25 IU/L (ref 0–40)
Albumin/Globulin Ratio: 1.5 (ref 1.2–2.2)
Albumin: 4.5 g/dL (ref 3.9–4.9)
Alkaline Phosphatase: 57 IU/L (ref 44–121)
BUN/Creatinine Ratio: 10 (ref 9–23)
BUN: 7 mg/dL (ref 6–20)
Bilirubin Total: 0.4 mg/dL (ref 0.0–1.2)
CO2: 18 mmol/L — ABNORMAL LOW (ref 20–29)
Calcium: 9.3 mg/dL (ref 8.7–10.2)
Chloride: 101 mmol/L (ref 96–106)
Creatinine, Ser: 0.69 mg/dL (ref 0.57–1.00)
Globulin, Total: 3 g/dL (ref 1.5–4.5)
Glucose: 73 mg/dL (ref 70–99)
Potassium: 3.9 mmol/L (ref 3.5–5.2)
Sodium: 138 mmol/L (ref 134–144)
Total Protein: 7.5 g/dL (ref 6.0–8.5)
eGFR: 114 mL/min/{1.73_m2} (ref >59)

## 2022-06-10 LAB — INSULIN, RANDOM: INSULIN: 6.6 u[IU]/mL (ref 2.6–24.9)

## 2022-06-10 LAB — LIPID PANEL WITH LDL/HDL RATIO
Cholesterol, Total: 227 mg/dL — ABNORMAL HIGH (ref 100–199)
HDL: 57 mg/dL (ref >39)
LDL Chol Calc (NIH): 153 mg/dL — ABNORMAL HIGH (ref 0–99)
LDL/HDL Ratio: 2.7 ratio (ref 0.0–3.2)
Triglycerides: 94 mg/dL (ref 0–149)
VLDL Cholesterol Cal: 17 mg/dL (ref 5–40)

## 2022-06-10 LAB — VITAMIN B12: Vitamin B-12: 716 pg/mL (ref 232–1245)

## 2022-06-22 ENCOUNTER — Ambulatory Visit (INDEPENDENT_AMBULATORY_CARE_PROVIDER_SITE_OTHER): Payer: BC Managed Care – PPO | Admitting: Internal Medicine

## 2022-06-22 ENCOUNTER — Encounter (INDEPENDENT_AMBULATORY_CARE_PROVIDER_SITE_OTHER): Payer: Self-pay | Admitting: Internal Medicine

## 2022-06-22 VITALS — BP 102/68 | HR 69 | Temp 98.2°F | Ht 66.0 in | Wt 184.0 lb

## 2022-06-22 DIAGNOSIS — E7800 Pure hypercholesterolemia, unspecified: Secondary | ICD-10-CM | POA: Diagnosis not present

## 2022-06-22 DIAGNOSIS — Z6831 Body mass index (BMI) 31.0-31.9, adult: Secondary | ICD-10-CM | POA: Diagnosis not present

## 2022-06-22 DIAGNOSIS — E669 Obesity, unspecified: Secondary | ICD-10-CM

## 2022-06-22 DIAGNOSIS — R7303 Prediabetes: Secondary | ICD-10-CM | POA: Diagnosis not present

## 2022-06-22 NOTE — Assessment & Plan Note (Signed)
LDL is not at goal. Elevated LDL may be secondary to nutrition, genetics and spillover effect from excess adiposity. Recommended LDL goal is <70 to reduce the risk of fatty streaks and the progression to obstructive ASCVD in the future.   Lab Results  Component Value Date   CHOL 227 (H) 06/08/2022   HDL 57 06/08/2022   LDLCALC 153 (H) 06/08/2022   TRIG 94 06/08/2022   CHOLHDL 2.8 05/03/2022    Continue weight loss therapy, losing 10% or more of body weight may improve condition. Also advised to reduce saturated fats in diet to less than 10% of daily calories.  Currently not a candidate for statin therapy

## 2022-06-22 NOTE — Assessment & Plan Note (Addendum)
Most recent A1c is  Lab Results  Component Value Date   HGBA1C 5.7 (H) 05/03/2022  . Patient informed of disease state and risk of progression. This may contribute to abnormal cravings, fatigue and diabetes complications without having diabetes.   We reviewed treatment options which include losing 7 to 10% of body weight, increasing physical activity to a 150 minutes a week of moderate intensity.She may also be a candidate for pharmacoprophylaxis with metformin or incretin mimetic.

## 2022-06-22 NOTE — Progress Notes (Signed)
Office: [phone]  /  Fax: [phone]  WEIGHT SUMMARY AND BIOMETRICS  Vitals Temp: 98.2 F (36.8 C) BP: 102/68 Pulse Rate: 69 SpO2: 100 %   Anthropometric Measurements Height: 5\' 6"  (1.676 m) Weight at Last Visit: 186 lb Weight Lost Since Last Visit: 2 lb Starting Weight: 186 lb Total Weight Loss (lbs): 2 lb (0.907 kg)   Body Composition  Body Fat %: 39.4 % Fat Mass (lbs): 72.6 lbs Muscle Mass (lbs): 106.2 lbs Total Body Water (lbs): 74.2 lbs Visceral Fat Rating : 7    HPI  Chief Complaint: OBESITY  [name] is here to discuss her progress with her obesity treatment plan. She is keeping a food journal and adhering to recommended goals of 1200 calories and 90 protein and states she is following her eating plan approximately 50 % of the time. She states she is not exercising.  Interval History:  Since last office visit she has lost 2 pounds. She reports fair adherence to reduced calorie nutritional plan. She has been working on not skipping meals.  She has also increased the amount of water she is drinking.  She has decreased white rice.  She has not been tracking or journaling calories.  Unfortunately she is not consuming prescribed amount of protein and is also been eating foods high in fat.  She likes nuts and avocados. [] Denies [x] Reports problems with appetite and hunger signals.  [] Denies [x] Reports problems with satiety and satiation.  [x] Denies [] Reports problems with eating patterns and portion control.  [x] Denies [] Reports abnormal cravings  Barriers identified having difficulty preparing healthy meals, having difficulty with meal prep and planning, and inability to focus on healthy eating.   Pharmacotherapy for weight loss: She is currently taking no anti-obesity medication.    ASSESSMENT AND PLAN  TREATMENT PLAN FOR OBESITY:  Recommended Dietary Goals  Shaylinn is currently in the action stage of change. As such, her goal is to continue weight  management plan. She has agreed to: keeping a food journal and adhering to recommended goals of 1200 calories and 90 protein.  Behavioral Intervention  We discussed the following Behavioral Modification Strategies today: increasing lean protein intake, increasing vegetables, increasing fiber rich foods, avoiding skipping meals, increasing water intake, work on meal planning and easy cooking plans, work on tracking and journaling calories using tracking App, and reading food labels .  Additional resources provided today: None  Recommended Physical Activity Goals  Taliana has been advised to work up to 150 minutes of moderate intensity aerobic activity a week and strengthening exercises 2-3 times per week for cardiovascular health, weight loss maintenance and preservation of muscle mass.   She has agreed to :  Think about ways to increase physical activity  Pharmacotherapy We discussed various medication options to help Raphael with her weight loss efforts and we both agreed to : continue with nutritional and behavioral strategies  ASSOCIATED CONDITIONS ADDRESSED TODAY  Prediabetes Assessment & Plan: Most recent A1c is  Lab Results  Component Value Date   HGBA1C 5.7 (H) 05/03/2022  . Patient informed of disease state and risk of progression. This may contribute to abnormal cravings, fatigue and diabetes complications without having diabetes.   We reviewed treatment options which include losing 7 to 10% of body weight, increasing physical activity to a 150 minutes a week of moderate intensity.She may also be a candidate for pharmacoprophylaxis with metformin or incretin mimetic.     Elevated LDL cholesterol level Assessment & Plan: LDL is not at goal. Elevated  LDL may be secondary to nutrition, genetics and spillover effect from excess adiposity. Recommended LDL goal is <70 to reduce the risk of fatty streaks and the progression to obstructive ASCVD in the future.   Lab Results   Component Value Date   CHOL 227 (H) 06/08/2022   HDL 57 06/08/2022   LDLCALC 153 (H) 06/08/2022   TRIG 94 06/08/2022   CHOLHDL 2.8 05/03/2022    Continue weight loss therapy, losing 10% or more of body weight may improve condition. Also advised to reduce saturated fats in diet to less than 10% of daily calories.  Currently not a candidate for statin therapy      Class 1 obesity with serious comorbidity and body mass index (BMI) of 31.0 to 31.9 in adult, unspecified obesity type     PHYSICAL EXAM:  Blood pressure 102/68, pulse 69, temperature 98.2 F (36.8 C), height 5\' 6"  (1.676 m), SpO2 100 %. Body mass index is 30.02 kg/m.  General: She is overweight, cooperative, alert, well developed, and in no acute distress. PSYCH: Has normal mood, affect and thought process.   HEENT: EOMI, sclerae are anicteric. Lungs: Normal breathing effort, no conversational dyspnea. Extremities: No edema.  Neurologic: No gross sensory or motor deficits. No tremors or fasciculations noted.    DIAGNOSTIC DATA REVIEWED:  BMET    Component Value Date/Time   NA 138 06/08/2022 1526   K 3.9 06/08/2022 1526   CL 101 06/08/2022 1526   CO2 18 (L) 06/08/2022 1526   GLUCOSE 73 06/08/2022 1526   GLUCOSE 103 (H) 01/14/2017 0442   BUN 7 06/08/2022 1526   CREATININE 0.69 06/08/2022 1526   CALCIUM 9.3 06/08/2022 1526   GFRNONAA 114 04/07/2018 1433   GFRAA 132 04/07/2018 1433   Lab Results  Component Value Date   HGBA1C 5.7 (H) 05/03/2022   HGBA1C 5.4 07/09/2020   Lab Results  Component Value Date   INSULIN 6.6 06/08/2022   Lab Results  Component Value Date   TSH 0.572 05/03/2022   CBC    Component Value Date/Time   WBC 4.6 05/03/2022 1636   WBC 8.3 01/05/2021 1132   RBC 4.32 05/03/2022 1636   RBC 3.37 (L) 01/05/2021 1132   HGB 12.3 05/03/2022 1636   HGB 11.4 02/08/2013 0000   HCT 37.3 05/03/2022 1636   HCT 33 02/08/2013 0000   PLT 212 05/03/2022 1636   PLT 174 02/08/2013 0000    MCV 86 05/03/2022 1636   MCH 28.5 05/03/2022 1636   MCH 26.7 01/05/2021 1132   MCHC 33.0 05/03/2022 1636   MCHC 32.0 01/05/2021 1132   RDW 12.3 05/03/2022 1636   Iron Studies    Component Value Date/Time   IRON 63 04/21/2021 1147   TIBC 311 04/21/2021 1147   FERRITIN 40 04/21/2021 1147   IRONPCTSAT 20 04/21/2021 1147   Lipid Panel     Component Value Date/Time   CHOL 227 (H) 06/08/2022 1526   TRIG 94 06/08/2022 1526   HDL 57 06/08/2022 1526   CHOLHDL 2.8 05/03/2022 1636   LDLCALC 153 (H) 06/08/2022 1526   Hepatic Function Panel     Component Value Date/Time   PROT 7.5 06/08/2022 1526   ALBUMIN 4.5 06/08/2022 1526   AST 25 06/08/2022 1526   ALT 26 06/08/2022 1526   ALKPHOS 57 06/08/2022 1526   BILITOT 0.4 06/08/2022 1526      Component Value Date/Time   TSH 0.572 05/03/2022 1636   Nutritional Lab Results  Component Value Date  VD25OH 17.8 (L) 05/03/2022   VD25OH 12.9 (L) 04/21/2021     Return in about 2 weeks (around 07/06/2022) for For Weight Mangement with Dr. Gerarda Fraction.Marland Kitchen She was informed of the importance of frequent follow up visits to maximize her success with intensive lifestyle modifications for her multiple health conditions.  I have spent 40 minutes in the care of the patient today including: preparing to see patient (e.g. review and interpretation of tests, old notes ), performing a medically appropriate examination or evaluation, counseling and educating the patient, documenting clinical information in the electronic or other health care record, and independently interpreting results and communicating results to the patient,family, or caregiver  ATTESTASTION STATEMENTS:  Reviewed by clinician on day of visit: allergies, medications, problem list, medical history, surgical history, family history, social history, and previous encounter notes.     Thomes Dinning, MD

## 2022-07-15 ENCOUNTER — Ambulatory Visit (INDEPENDENT_AMBULATORY_CARE_PROVIDER_SITE_OTHER): Payer: BC Managed Care – PPO | Admitting: Internal Medicine

## 2022-07-15 ENCOUNTER — Encounter (INDEPENDENT_AMBULATORY_CARE_PROVIDER_SITE_OTHER): Payer: Self-pay | Admitting: Internal Medicine

## 2022-07-15 VITALS — BP 120/75 | HR 85 | Temp 98.7°F | Ht 66.0 in | Wt 181.0 lb

## 2022-07-15 DIAGNOSIS — E669 Obesity, unspecified: Secondary | ICD-10-CM

## 2022-07-15 DIAGNOSIS — R7303 Prediabetes: Secondary | ICD-10-CM | POA: Diagnosis not present

## 2022-07-15 DIAGNOSIS — E7800 Pure hypercholesterolemia, unspecified: Secondary | ICD-10-CM | POA: Diagnosis not present

## 2022-07-15 DIAGNOSIS — Z6831 Body mass index (BMI) 31.0-31.9, adult: Secondary | ICD-10-CM | POA: Diagnosis not present

## 2022-07-15 NOTE — Progress Notes (Signed)
Office: [phone]  /  Fax: [phone]  WEIGHT SUMMARY AND BIOMETRICS  Vitals Temp: 98.7 F (37.1 C) BP: 120/75 Pulse Rate: 85 SpO2: 98 %   Anthropometric Measurements Height: 5\' 6"  (1.676 m) Weight: 181 lb (82.1 kg) BMI (Calculated): 29.23 Weight at Last Visit: 184 lb Weight Lost Since Last Visit: 3 lb Starting Weight: 186 lb   Body Composition  Body Fat %: 38 % Fat Mass (lbs): 69 lbs Muscle Mass (lbs): 106.8 lbs Total Body Water (lbs): 73.6 lbs Visceral Fat Rating : 7   Weight Lost Since Last Visit: 3 lb    HPI  Chief Complaint: OBESITY  [name] is here to discuss her progress with her obesity treatment plan. She is on the keeping a food journal and adhering to recommended goals of 1200 calories and 90 protein and states she is following her eating plan approximately 70% of the time.   Interval History:  Since last office visit she has lost 3 pounds. She reports good adherence to reduced calorie nutritional plan. She has been working on reading food labels, not skipping meals, increasing protein intake at every meal, eating more fruits, eating more vegetables, drinking more water, avoiding and or reducing liquid calories, journaling and tracking calories, and making healthier choices Denies problems with appetite and hunger signals.  Denies problems with satiety and satiation.  Denies problems with eating patterns and portion control.  Denies abnormal cravings. Denies feeling deprived or restricted.   Barriers identified: none.   Pharmacotherapy for weight loss: She is currently taking no anti-obesity medication.    ASSESSMENT AND PLAN  TREATMENT PLAN FOR OBESITY:  Recommended Dietary Goals  [name] is currently in the action stage of change. As such, her goal is to continue weight management plan. She has agreed to: continue current plan  Behavioral Intervention  We discussed the following Behavioral Modification Strategies today: increasing lean  protein intake, decreasing simple carbohydrates , increasing vegetables, increasing lower glycemic fruits, increasing fiber rich foods, and avoiding skipping meals.  Additional resources provided today: None  Recommended Physical Activity Goals  [name] has been advised to work up to 150 minutes of moderate intensity aerobic activity a week and strengthening exercises 2-3 times per week for cardiovascular health, weight loss maintenance and preservation of muscle mass.   She has agreed to :  Think about ways to increase physical activity  Pharmacotherapy We discussed various medication options to help [name] with her weight loss efforts and we both agreed to : continue with nutritional and behavioral strategies  ASSOCIATED CONDITIONS ADDRESSED TODAY  Prediabetes Assessment & Plan: Most recent A1c is  Lab Results  Component Value Date   HGBA1C 5.7 (H) 05/03/2022  . Patient informed of disease state and risk of progression. This may contribute to abnormal cravings, fatigue and diabetes complications without having diabetes.   Continue with nutritional and behavioral strategies for weight loss.  She may also be a candidate for pharmacoprophylaxis with metformin or incretin mimetic.     Class 1 obesity with serious comorbidity and body mass index (BMI) of 31.0 to 31.9 in adult, unspecified obesity type  Elevated LDL cholesterol level Assessment & Plan: LDL is not at goal. Elevated LDL may be secondary to nutrition, genetics and spillover effect from excess adiposity. Recommended LDL goal is <70 to reduce the risk of fatty streaks and the progression to obstructive ASCVD in the future.   Lab Results  Component Value Date   CHOL 227 (H) 06/08/2022   HDL 57  06/08/2022   LDLCALC 153 (H) 06/08/2022   TRIG 94 06/08/2022   CHOLHDL 2.8 05/03/2022    Continue weight loss therapy, losing 10% or more of body weight may improve condition. Also advised to reduce saturated fats in diet to less  than 10% of daily calories. She has cut down on saturated fats Currently not a candidate for statin therapy         PHYSICAL EXAM:  Blood pressure 120/75, pulse 85, temperature 98.7 F (37.1 C), height 5\' 6"  (1.676 m), weight 181 lb (82.1 kg), SpO2 98 %. Body mass index is 29.21 kg/m.  General: She is overweight, cooperative, alert, well developed, and in no acute distress. PSYCH: Has normal mood, affect and thought process.   HEENT: EOMI, sclerae are anicteric. Lungs: Normal breathing effort, no conversational dyspnea. Extremities: No edema.  Neurologic: No gross sensory or motor deficits. No tremors or fasciculations noted.    DIAGNOSTIC DATA REVIEWED:  BMET    Component Value Date/Time   NA 138 06/08/2022 1526   K 3.9 06/08/2022 1526   CL 101 06/08/2022 1526   CO2 18 (L) 06/08/2022 1526   GLUCOSE 73 06/08/2022 1526   GLUCOSE 103 (H) 01/14/2017 0442   BUN 7 06/08/2022 1526   CREATININE 0.69 06/08/2022 1526   CALCIUM 9.3 06/08/2022 1526   GFRNONAA 114 04/07/2018 1433   GFRAA 132 04/07/2018 1433   Lab Results  Component Value Date   HGBA1C 5.7 (H) 05/03/2022   HGBA1C 5.4 07/09/2020   Lab Results  Component Value Date   INSULIN 6.6 06/08/2022   Lab Results  Component Value Date   TSH 0.572 05/03/2022   CBC    Component Value Date/Time   WBC 4.6 05/03/2022 1636   WBC 8.3 01/05/2021 1132   RBC 4.32 05/03/2022 1636   RBC 3.37 (L) 01/05/2021 1132   HGB 12.3 05/03/2022 1636   HGB 11.4 02/08/2013 0000   HCT 37.3 05/03/2022 1636   HCT 33 02/08/2013 0000   PLT 212 05/03/2022 1636   PLT 174 02/08/2013 0000   MCV 86 05/03/2022 1636   MCH 28.5 05/03/2022 1636   MCH 26.7 01/05/2021 1132   MCHC 33.0 05/03/2022 1636   MCHC 32.0 01/05/2021 1132   RDW 12.3 05/03/2022 1636   Iron Studies    Component Value Date/Time   IRON 63 04/21/2021 1147   TIBC 311 04/21/2021 1147   FERRITIN 40 04/21/2021 1147   IRONPCTSAT 20 04/21/2021 1147   Lipid Panel      Component Value Date/Time   CHOL 227 (H) 06/08/2022 1526   TRIG 94 06/08/2022 1526   HDL 57 06/08/2022 1526   CHOLHDL 2.8 05/03/2022 1636   LDLCALC 153 (H) 06/08/2022 1526   Hepatic Function Panel     Component Value Date/Time   PROT 7.5 06/08/2022 1526   ALBUMIN 4.5 06/08/2022 1526   AST 25 06/08/2022 1526   ALT 26 06/08/2022 1526   ALKPHOS 57 06/08/2022 1526   BILITOT 0.4 06/08/2022 1526      Component Value Date/Time   TSH 0.572 05/03/2022 1636   Nutritional Lab Results  Component Value Date   VD25OH 17.8 (L) 05/03/2022   VD25OH 12.9 (L) 04/21/2021     Return in about 3 weeks (around 08/05/2022) for For Weight Mangement with Dr. Rikki Spearing.Marland Kitchen She was informed of the importance of frequent follow up visits to maximize her success with intensive lifestyle modifications for her multiple health conditions.   ATTESTASTION STATEMENTS:  Reviewed by clinician  on day of visit: allergies, medications, problem list, medical history, surgical history, family history, social history, and previous encounter notes.     Worthy Rancher, MD

## 2022-07-16 NOTE — Assessment & Plan Note (Signed)
Most recent A1c is  Lab Results  Component Value Date   HGBA1C 5.7 (H) 05/03/2022  . Patient informed of disease state and risk of progression. This may contribute to abnormal cravings, fatigue and diabetes complications without having diabetes.   Continue with nutritional and behavioral strategies for weight loss.  She may also be a candidate for pharmacoprophylaxis with metformin or incretin mimetic.

## 2022-07-16 NOTE — Assessment & Plan Note (Addendum)
LDL is not at goal. Elevated LDL may be secondary to nutrition, genetics and spillover effect from excess adiposity. Recommended LDL goal is <70 to reduce the risk of fatty streaks and the progression to obstructive ASCVD in the future.   Lab Results  Component Value Date   CHOL 227 (H) 06/08/2022   HDL 57 06/08/2022   LDLCALC 153 (H) 06/08/2022   TRIG 94 06/08/2022   CHOLHDL 2.8 05/03/2022    Continue weight loss therapy, losing 10% or more of body weight may improve condition. Also advised to reduce saturated fats in diet to less than 10% of daily calories. She has cut down on saturated fats Currently not a candidate for statin therapy

## 2022-08-10 ENCOUNTER — Encounter (INDEPENDENT_AMBULATORY_CARE_PROVIDER_SITE_OTHER): Payer: Self-pay | Admitting: Internal Medicine

## 2022-08-10 ENCOUNTER — Ambulatory Visit (INDEPENDENT_AMBULATORY_CARE_PROVIDER_SITE_OTHER): Payer: BC Managed Care – PPO | Admitting: Internal Medicine

## 2022-08-10 VITALS — BP 123/77 | HR 71 | Temp 97.8°F | Ht 66.0 in | Wt 185.0 lb

## 2022-08-10 DIAGNOSIS — E668 Other obesity: Secondary | ICD-10-CM

## 2022-08-10 DIAGNOSIS — R7303 Prediabetes: Secondary | ICD-10-CM

## 2022-08-10 DIAGNOSIS — Z6829 Body mass index (BMI) 29.0-29.9, adult: Secondary | ICD-10-CM | POA: Diagnosis not present

## 2022-08-10 DIAGNOSIS — E7800 Pure hypercholesterolemia, unspecified: Secondary | ICD-10-CM | POA: Diagnosis not present

## 2022-08-10 DIAGNOSIS — E669 Obesity, unspecified: Secondary | ICD-10-CM

## 2022-08-10 NOTE — Progress Notes (Unsigned)
Office: [phone]  /  Fax: [phone]  WEIGHT SUMMARY AND BIOMETRICS  Vitals Temp: 97.8 F (36.6 C) BP: 123/77 Pulse Rate: 71 SpO2: 100 %   Anthropometric Measurements Height: 5\' 6"  (1.676 m) Weight: 185 lb (83.9 kg) BMI (Calculated): 29.87 Weight at Last Visit: 181 lb Weight Lost Since Last Visit: 4 lb Starting Weight: 186 lb Total Weight Loss (lbs): 1 lb (0.454 kg)   Body Composition  Body Fat %: 39.7 % Fat Mass (lbs): 73.8 lbs Muscle Mass (lbs): 106.4 lbs Total Body Water (lbs): 75.8 lbs Visceral Fat Rating : 7    No data recorded Today's Visit #: 4  Starting Date: 06/22/22   HPI  Chief Complaint: OBESITY  [name] is here to discuss her progress with her obesity treatment plan. She is on the keeping a food journal and adhering to recommended goals of 1200 calories and 90 protein and states she is following her eating plan approximately 70 % of the time. She states she is not exercising.  Interval History:  Since last office visit she has gained 4 pounds.  She attributes this to increased snacking.  She also notes increased cravings for sweets.  She is not exercising.  She had opted for tracking and journaling calories but has not been doing so.  She was not interested in low-carb plan or reduced calorie nutrition plan.   Barriers identified: strong hunger signals and appetite.   Pharmacotherapy for weight loss: She is currently taking no anti-obesity medication.    ASSESSMENT AND PLAN  TREATMENT PLAN FOR OBESITY:  Recommended Dietary Goals  [name] is currently in the contemplation stage of change. As such, her goal is to continue to work on implementation of [employer] plan. She has agreed to: continue current plan  Behavioral Intervention  We discussed the following Behavioral Modification Strategies today: increasing lean protein intake, decreasing simple carbohydrates , increasing vegetables, increasing lower glycemic fruits, increasing  fiber rich foods, increasing water intake, work on meal planning and preparation, work on tracking and journaling calories using tracking application, emotional eating strategies and understanding the difference between hunger signals and cravings, and planning for success.  Additional resources provided today: None  Recommended Physical Activity Goals  [name] has been advised to work up to 150 minutes of moderate intensity aerobic activity a week and strengthening exercises 2-3 times per week for cardiovascular health, weight loss maintenance and preservation of muscle mass.   She has agreed to :  Think about ways to increase physical activity  Pharmacotherapy We discussed various medication options to help [name] with her weight loss efforts and we both agreed to : continue with nutritional and behavioral strategies  ASSOCIATED CONDITIONS ADDRESSED TODAY  Class 1 obesity with serious comorbidity and body mass index (BMI) of 31.0 to 31.9 in adult, unspecified obesity type  Elevated LDL cholesterol level Assessment & Plan: LDL is not at goal. Elevated LDL may be secondary to nutrition, genetics and spillover effect from excess adiposity. Recommended LDL goal is <70 to reduce the risk of fatty streaks and the progression to obstructive ASCVD in the future.   Lab Results  Component Value Date   CHOL 227 (H) 06/08/2022   HDL 57 06/08/2022   LDLCALC 153 (H) 06/08/2022   TRIG 94 06/08/2022   CHOLHDL 2.8 05/03/2022    Continue weight loss therapy, losing 10% or more of body weight may improve condition. Also advised to reduce saturated fats in diet to less than 10% of daily calories. She  has cut down on saturated fats Currently not a candidate for statin therapy      Prediabetes Assessment & Plan: Most recent A1c is  Lab Results  Component Value Date   HGBA1C 5.7 (H) 05/03/2022  . Patient informed of disease state and risk of progression. This may contribute to abnormal cravings,  fatigue and diabetes complications without having diabetes.   Continue with nutritional and behavioral strategies for weight loss.  She may also be a candidate for pharmacoprophylaxis with metformin or incretin mimetic.       PHYSICAL EXAM:  Blood pressure 123/77, pulse 71, temperature 97.8 F (36.6 C), height 5\' 6"  (1.676 m), weight 185 lb (83.9 kg), last menstrual period 07/19/2022, SpO2 100 %. Body mass index is 29.86 kg/m.  General: She is overweight, cooperative, alert, well developed, and in no acute distress. PSYCH: Has normal mood, affect and thought process.   HEENT: EOMI, sclerae are anicteric. Lungs: Normal breathing effort, no conversational dyspnea. Extremities: No edema.  Neurologic: No gross sensory or motor deficits. No tremors or fasciculations noted.    DIAGNOSTIC DATA REVIEWED:  BMET    Component Value Date/Time   NA 138 06/08/2022 1526   K 3.9 06/08/2022 1526   CL 101 06/08/2022 1526   CO2 18 (L) 06/08/2022 1526   GLUCOSE 73 06/08/2022 1526   GLUCOSE 103 (H) 01/14/2017 0442   BUN 7 06/08/2022 1526   CREATININE 0.69 06/08/2022 1526   CALCIUM 9.3 06/08/2022 1526   GFRNONAA 114 04/07/2018 1433   GFRAA 132 04/07/2018 1433   Lab Results  Component Value Date   HGBA1C 5.7 (H) 05/03/2022   HGBA1C 5.4 07/09/2020   Lab Results  Component Value Date   INSULIN 6.6 06/08/2022   Lab Results  Component Value Date   TSH 0.572 05/03/2022   CBC    Component Value Date/Time   WBC 4.6 05/03/2022 1636   WBC 8.3 01/05/2021 1132   RBC 4.32 05/03/2022 1636   RBC 3.37 (L) 01/05/2021 1132   HGB 12.3 05/03/2022 1636   HGB 11.4 02/08/2013 0000   HCT 37.3 05/03/2022 1636   HCT 33 02/08/2013 0000   PLT 212 05/03/2022 1636   PLT 174 02/08/2013 0000   MCV 86 05/03/2022 1636   MCH 28.5 05/03/2022 1636   MCH 26.7 01/05/2021 1132   MCHC 33.0 05/03/2022 1636   MCHC 32.0 01/05/2021 1132   RDW 12.3 05/03/2022 1636   Iron Studies    Component Value Date/Time    IRON 63 04/21/2021 1147   TIBC 311 04/21/2021 1147   FERRITIN 40 04/21/2021 1147   IRONPCTSAT 20 04/21/2021 1147   Lipid Panel     Component Value Date/Time   CHOL 227 (H) 06/08/2022 1526   TRIG 94 06/08/2022 1526   HDL 57 06/08/2022 1526   CHOLHDL 2.8 05/03/2022 1636   LDLCALC 153 (H) 06/08/2022 1526   Hepatic Function Panel     Component Value Date/Time   PROT 7.5 06/08/2022 1526   ALBUMIN 4.5 06/08/2022 1526   AST 25 06/08/2022 1526   ALT 26 06/08/2022 1526   ALKPHOS 57 06/08/2022 1526   BILITOT 0.4 06/08/2022 1526      Component Value Date/Time   TSH 0.572 05/03/2022 1636   Nutritional Lab Results  Component Value Date   VD25OH 17.8 (L) 05/03/2022   VD25OH 12.9 (L) 04/21/2021     Return in about 3 weeks (around 08/31/2022) for For Weight Mangement with Dr. Rikki Spearing.Marland Kitchen She was informed of the  importance of frequent follow up visits to maximize her success with intensive lifestyle modifications for her multiple health conditions.   ATTESTASTION STATEMENTS:  Reviewed by clinician on day of visit: allergies, medications, problem list, medical history, surgical history, family history, social history, and previous encounter notes.     Worthy Rancher, MD

## 2022-08-11 NOTE — Assessment & Plan Note (Signed)
Most recent A1c is  Lab Results  Component Value Date   HGBA1C 5.7 (H) 05/03/2022  . Patient informed of disease state and risk of progression. This may contribute to abnormal cravings, fatigue and diabetes complications without having diabetes.   Continue with nutritional and behavioral strategies for weight loss.  She may also be a candidate for pharmacoprophylaxis with metformin or incretin mimetic.   

## 2022-08-11 NOTE — Assessment & Plan Note (Signed)
LDL is not at goal. Elevated LDL may be secondary to nutrition, genetics and spillover effect from excess adiposity. Recommended LDL goal is <70 to reduce the risk of fatty streaks and the progression to obstructive ASCVD in the future.   Lab Results  Component Value Date   CHOL 227 (H) 06/08/2022   HDL 57 06/08/2022   LDLCALC 153 (H) 06/08/2022   TRIG 94 06/08/2022   CHOLHDL 2.8 05/03/2022    Continue weight loss therapy, losing 10% or more of body weight may improve condition. Also advised to reduce saturated fats in diet to less than 10% of daily calories. She has cut down on saturated fats Currently not a candidate for statin therapy    

## 2022-09-13 ENCOUNTER — Ambulatory Visit (INDEPENDENT_AMBULATORY_CARE_PROVIDER_SITE_OTHER): Payer: BC Managed Care – PPO | Admitting: Internal Medicine

## 2022-09-14 ENCOUNTER — Encounter: Payer: Self-pay | Admitting: Family Medicine

## 2022-09-14 ENCOUNTER — Ambulatory Visit (INDEPENDENT_AMBULATORY_CARE_PROVIDER_SITE_OTHER): Payer: BC Managed Care – PPO | Admitting: Family Medicine

## 2022-09-14 VITALS — BP 101/70 | HR 88 | Ht 66.0 in | Wt 183.0 lb

## 2022-09-14 DIAGNOSIS — E7800 Pure hypercholesterolemia, unspecified: Secondary | ICD-10-CM

## 2022-09-14 DIAGNOSIS — R7303 Prediabetes: Secondary | ICD-10-CM

## 2022-09-14 DIAGNOSIS — E785 Hyperlipidemia, unspecified: Secondary | ICD-10-CM

## 2022-09-14 DIAGNOSIS — E559 Vitamin D deficiency, unspecified: Secondary | ICD-10-CM

## 2022-09-14 NOTE — Patient Instructions (Signed)
It was wonderful to see you today. Thank you for allowing me to be a part of your care. Below is a short summary of what we discussed at your visit today:  Blood work today We will check in on your vitamin D, A1c, and LDL "bad cholesterol". If the results are normal, I will send you a letter or MyChart message. If the results are abnormal, I will give you a call.    Weight loss Excellent job on the weight loss! Keep up the great work! Last visit in January was 194 lbs.  Today your weight was 183 lbs.   Primary care doctor graduating soon! I am graduating June 28th. You will be assigned a new PCP.  Thank you for letting me care for you! It has been wonderful getting to know you.   Anemia Next check in January unless you develop symptoms of worsening anemia despite your iron supplement.   Please bring all of your medications to every appointment! If you have any questions or concerns, please do not hesitate to contact us via phone or MyChart message.   Fayette Pho, MD

## 2022-09-14 NOTE — Progress Notes (Unsigned)
   SUBJECTIVE:   CHIEF COMPLAINT / HPI:   Follow up [name] presents today for follow-up and repeat blood work at her last exam with myself in January, we found her A1c to be in the prediabetic range, LDL elevated at 119, and low vitamin D.  Today, she reports she has been taking her vitamin D and iron supplementation at least 4 times weekly.  She remembers usually on the days that she goes to work.  Has making some changes in terms of diet and exercise.  Has lost some weight since her last visit, was 194 pounds on our scale 05/05/2022 and today is 183 pounds.    Lab Results  Component Value Date   HGBA1C 5.7 (H) 09/14/2022   HGBA1C 5.7 (H) 05/03/2022   HGBA1C 5.4 07/09/2020   Lab Results  Component Value Date   LDLCALC 153 (H) 06/08/2022   CREATININE 0.69 06/08/2022    PERTINENT  PMH / PSH:  Patient Active Problem List   Diagnosis Date Noted   SOB (shortness of breath) on exertion 06/08/2022   Class 1 obesity with serious comorbidity and body mass index (BMI) of 31.0 to 31.9 in adult 05/10/2022   Prediabetes 05/05/2022   Elevated LDL cholesterol level 05/05/2022   BMI 31.0-31.9,adult 05/03/2022   Bilateral carpal tunnel syndrome 06/24/2021   Paresthesia 04/21/2021   Vitamin D deficiency 04/21/2021   Leg cramp 04/21/2021   History of cesarean section 01/07/2021   Hx of myomectomy 01/07/2021   Microcytic anemia 07/22/2020   Hyperthyroidism during pregnancy 07/21/2020   Other fatigue 04/07/2018   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   Fibroids 05/03/2013    OBJECTIVE:   BP 101/70   Pulse 88   Ht 5\' 6"  (1.676 m)   Wt 183 lb (83 kg)   LMP 08/16/2022 (Approximate)   SpO2 99%   BMI 29.54 kg/m     PHQ-9:     05/03/2022   11:30 AM 04/21/2021   11:24 AM 10/30/2020   10:48 AM  Depression screen PHQ 2/9  Decreased Interest 0 0 0  Down, Depressed, Hopeless 0 0 0  PHQ - 2 Score 0 0 0  Altered sleeping 0 0 0  Tired, decreased energy 0 0 0  Change in appetite 0 0 0   Feeling bad or failure about yourself  0 0 0  Trouble concentrating 0 0 0  Moving slowly or fidgety/restless 0 0 0  Suicidal thoughts 0 0 0  PHQ-9 Score 0 0 0  Difficult doing work/chores Not difficult at all Not difficult at all     Physical Exam General: Awake, alert, oriented Cardiovascular: Regular rate and rhythm, S1 and S2 present, no murmurs auscultated Respiratory: Lung fields clear to auscultation bilaterally  ASSESSMENT/PLAN:   Prediabetes Has made improvements to diet and exercise.  Will repeat A1c today.  Vitamin D deficiency Takes vitamin D about 4 times weekly.  Will assess level today with blood work.  Elevated LDL cholesterol level Last LDL on lipid screen January 2024 was 119.  Has made changes to diet and exercise.  Will check direct LDL today.    Fayette Pho, MD [facility]

## 2022-09-15 ENCOUNTER — Encounter: Payer: Self-pay | Admitting: Family Medicine

## 2022-09-15 LAB — HEMOGLOBIN A1C
Est. average glucose Bld gHb Est-mCnc: 117 mg/dL
Hgb A1c MFr Bld: 5.7 % — ABNORMAL HIGH (ref 4.8–5.6)

## 2022-09-15 LAB — LDL CHOLESTEROL, DIRECT: LDL Direct: 85 mg/dL (ref 0–99)

## 2022-09-15 LAB — VITAMIN D 25 HYDROXY (VIT D DEFICIENCY, FRACTURES): Vit D, 25-Hydroxy: 42.2 ng/mL (ref 30.0–100.0)

## 2022-09-15 NOTE — Assessment & Plan Note (Signed)
Last LDL on lipid screen January 2024 was 119.  Has made changes to diet and exercise.  Will check direct LDL today.

## 2022-09-15 NOTE — Assessment & Plan Note (Signed)
Takes vitamin D about 4 times weekly.  Will assess level today with blood work.

## 2022-09-15 NOTE — Assessment & Plan Note (Signed)
Has made improvements to diet and exercise.  Will repeat A1c today.

## 2022-09-20 ENCOUNTER — Ambulatory Visit (INDEPENDENT_AMBULATORY_CARE_PROVIDER_SITE_OTHER): Payer: BC Managed Care – PPO | Admitting: Internal Medicine

## 2022-09-20 ENCOUNTER — Encounter (INDEPENDENT_AMBULATORY_CARE_PROVIDER_SITE_OTHER): Payer: Self-pay | Admitting: Internal Medicine

## 2022-09-20 VITALS — BP 112/70 | HR 78 | Temp 98.3°F | Ht 66.0 in | Wt 180.0 lb

## 2022-09-20 DIAGNOSIS — E669 Obesity, unspecified: Secondary | ICD-10-CM | POA: Diagnosis not present

## 2022-09-20 DIAGNOSIS — Z6829 Body mass index (BMI) 29.0-29.9, adult: Secondary | ICD-10-CM

## 2022-09-20 DIAGNOSIS — E7800 Pure hypercholesterolemia, unspecified: Secondary | ICD-10-CM | POA: Diagnosis not present

## 2022-09-20 DIAGNOSIS — E559 Vitamin D deficiency, unspecified: Secondary | ICD-10-CM | POA: Diagnosis not present

## 2022-09-20 DIAGNOSIS — R7303 Prediabetes: Secondary | ICD-10-CM | POA: Diagnosis not present

## 2022-09-20 MED ORDER — METFORMIN HCL ER 500 MG PO TB24: 500.0000 mg | ORAL_TABLET | Freq: Every day | ORAL | 0 refills | Status: DC

## 2022-09-20 NOTE — Assessment & Plan Note (Signed)
Most recent A1c is  Lab Results  Component Value Date   HGBA1C 5.7 (H) 09/14/2022  . Patient informed of disease state and risk of progression. This may contribute to abnormal cravings, fatigue and diabetes complications without having diabetes.   Continue with nutritional and behavioral strategies for weight loss.  In addition she is agreeable to starting metformin XR 500 mg 1 tablet daily for pharmacoprophylaxis.  Medication may also support weight loss efforts.

## 2022-09-20 NOTE — Progress Notes (Signed)
Office: [phone]  /  Fax: [phone]  WEIGHT SUMMARY AND BIOMETRICS  Vitals Temp: 98.3 F (36.8 C) BP: 112/70 Pulse Rate: 78 SpO2: 99 %   Anthropometric Measurements Height: 5\' 6"  (1.676 m) Weight: 180 lb (81.6 kg) BMI (Calculated): 29.07 Weight Lost Since Last Visit: 3 lb Starting Weight: 186 lb Total Weight Loss (lbs): 4 lb (1.814 kg)   Body Composition  Body Fat %: 37.5 % Fat Mass (lbs): 67.6 lbs Muscle Mass (lbs): 106.8 lbs Total Body Water (lbs): 76 lbs Visceral Fat Rating : 7    No data recorded Today's Visit #: 5  Starting Date: 06/22/22   HPI  Chief Complaint: OBESITY  Doralyn is here to discuss her progress with her obesity treatment plan. 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 She states she is not exercising.  Interval History:  Since last office visit she has lost 3 lbs. She reports fair adherence to reduced calorie nutritional plan. She has been working on reading food labels, not skipping meals, and drinking more water  Orixegenic Control: Denies problems with appetite and hunger signals.  Denies problems with satiety and satiation.  Denies problems with eating patterns and portion control.  Denies abnormal cravings. Denies feeling deprived or restricted.   Barriers identified: physical inactivity.   Pharmacotherapy for weight loss: She is currently taking no anti-obesity medication.    ASSESSMENT AND PLAN  TREATMENT PLAN FOR OBESITY:  Recommended Dietary Goals  Trysten is currently in the action stage of change. As such, her goal is to continue weight management plan. She has agreed to: continue current plan  Behavioral Intervention  We discussed the following Behavioral Modification Strategies today: increasing lean protein intake, decreasing simple carbohydrates , increasing vegetables, increasing lower glycemic fruits, increasing fiber rich foods, increasing water  intake, work on tracking and journaling calories using tracking application, reading food labels , continue to practice mindfulness when eating, and planning for success.  Additional resources provided today: None  Recommended Physical Activity Goals  Siobhan has been advised to work up to 150 minutes of moderate intensity aerobic activity a week and strengthening exercises 2-3 times per week for cardiovascular health, weight loss maintenance and preservation of muscle mass.   She has agreed to :  Think about ways to increase daily physical activity and overcoming barriers to exercise and Increase physical activity in their day and reduce sedentary time (increase NEAT).  Pharmacotherapy  Start metformin XR 500 mg 1 tablet daily with primary indication of diabetes prevention.  ASSOCIATED CONDITIONS ADDRESSED TODAY  Elevated LDL cholesterol level Assessment & Plan: Reviewed most recent LDL cholesterol has improved significantly with dietary changes.  LDL now in the 80s previously in the 150s.  No need for statin therapy.  Continue with nutritional strategies..   Class 1 obesity with serious comorbidity and body mass index (BMI) of 31.0 to 31.9 in adult, unspecified obesity type  Vitamin D deficiency Assessment & Plan: Most recent vitamin D levels  Lab Results  Component Value Date   VD25OH 42.2 09/14/2022   VD25OH 17.8 (L) 05/03/2022   VD25OH 12.9 (L) 04/21/2021    Have improved.  Deficiency state associated with adiposity and may result in leptin resistance, weight gain and fatigue. Currently on vitamin D supplementation 5000 iu a day without any adverse effects.  Plan: Continue vitamin D3 5000 international units 4 days a week.  Recheck levels at 4 months to make  sure not over supplementing.    Prediabetes Assessment & Plan: Most recent A1c is  Lab Results  Component Value Date   HGBA1C 5.7 (H) 09/14/2022  . Patient informed of disease state and risk of progression. This  may contribute to abnormal cravings, fatigue and diabetes complications without having diabetes.   Continue with nutritional and behavioral strategies for weight loss.  In addition she is agreeable to starting metformin XR 500 mg 1 tablet daily for pharmacoprophylaxis.  Medication may also support weight loss efforts.   Orders: -     metFORMIN HCl ER; Take 1 tablet (500 mg total) by mouth daily with breakfast.  Dispense: 30 tablet; Refill: 0    PHYSICAL EXAM:  Blood pressure 112/70, pulse 78, temperature 98.3 F (36.8 C), height 5\' 6"  (1.676 m), weight 180 lb (81.6 kg), last menstrual period 08/16/2022, SpO2 99 %. Body mass index is 29.05 kg/m.  General: She is overweight, cooperative, alert, well developed, and in no acute distress. PSYCH: Has normal mood, affect and thought process.   HEENT: EOMI, sclerae are anicteric. Lungs: Normal breathing effort, no conversational dyspnea. Extremities: No edema.  Neurologic: No gross sensory or motor deficits. No tremors or fasciculations noted.    DIAGNOSTIC DATA REVIEWED:  BMET    Component Value Date/Time   NA 138 06/08/2022 1526   K 3.9 06/08/2022 1526   CL 101 06/08/2022 1526   CO2 18 (L) 06/08/2022 1526   GLUCOSE 73 06/08/2022 1526   GLUCOSE 103 (H) 01/14/2017 0442   BUN 7 06/08/2022 1526   CREATININE 0.69 06/08/2022 1526   CALCIUM 9.3 06/08/2022 1526   GFRNONAA 114 04/07/2018 1433   GFRAA 132 04/07/2018 1433   Lab Results  Component Value Date   HGBA1C 5.7 (H) 09/14/2022   HGBA1C 5.4 07/09/2020   Lab Results  Component Value Date   INSULIN 6.6 06/08/2022   Lab Results  Component Value Date   TSH 0.572 05/03/2022   CBC    Component Value Date/Time   WBC 4.6 05/03/2022 1636   WBC 8.3 01/05/2021 1132   RBC 4.32 05/03/2022 1636   RBC 3.37 (L) 01/05/2021 1132   HGB 12.3 05/03/2022 1636   HGB 11.4 02/08/2013 0000   HCT 37.3 05/03/2022 1636   HCT 33 02/08/2013 0000   PLT 212 05/03/2022 1636   PLT 174 02/08/2013  0000   MCV 86 05/03/2022 1636   MCH 28.5 05/03/2022 1636   MCH 26.7 01/05/2021 1132   MCHC 33.0 05/03/2022 1636   MCHC 32.0 01/05/2021 1132   RDW 12.3 05/03/2022 1636   Iron Studies    Component Value Date/Time   IRON 63 04/21/2021 1147   TIBC 311 04/21/2021 1147   FERRITIN 40 04/21/2021 1147   IRONPCTSAT 20 04/21/2021 1147   Lipid Panel     Component Value Date/Time   CHOL 227 (H) 06/08/2022 1526   TRIG 94 06/08/2022 1526   HDL 57 06/08/2022 1526   CHOLHDL 2.8 05/03/2022 1636   LDLCALC 153 (H) 06/08/2022 1526   LDLDIRECT 85 09/14/2022 1649   Hepatic Function Panel     Component Value Date/Time   PROT 7.5 06/08/2022 1526   ALBUMIN 4.5 06/08/2022 1526   AST 25 06/08/2022 1526   ALT 26 06/08/2022 1526   ALKPHOS 57 06/08/2022 1526   BILITOT 0.4 06/08/2022 1526      Component Value Date/Time   TSH 0.572 05/03/2022 1636   Nutritional Lab Results  Component Value Date   VD25OH 42.2  09/14/2022   VD25OH 17.8 (L) 05/03/2022   VD25OH 12.9 (L) 04/21/2021     Return in about 3 weeks (around 10/11/2022) for For Weight Mangement with Dr. Rikki Spearing.Marland Kitchen She was informed of the importance of frequent follow up visits to maximize her success with intensive lifestyle modifications for her multiple health conditions.   ATTESTASTION STATEMENTS:  Reviewed by clinician on day of visit: allergies, medications, problem list, medical history, surgical history, family history, social history, and previous encounter notes.     Worthy Rancher, MD

## 2022-09-20 NOTE — Assessment & Plan Note (Addendum)
Most recent vitamin D levels  Lab Results  Component Value Date   VD25OH 42.2 09/14/2022   VD25OH 17.8 (L) 05/03/2022   VD25OH 12.9 (L) 04/21/2021    Have improved.  Deficiency state associated with adiposity and may result in leptin resistance, weight gain and fatigue. Currently on vitamin D supplementation 5000 iu a day without any adverse effects.  Plan: Continue vitamin D3 5000 international units 4 days a week.  Recheck levels at 4 months to make sure not over supplementing.

## 2022-09-20 NOTE — Assessment & Plan Note (Signed)
Reviewed most recent LDL cholesterol has improved significantly with dietary changes.  LDL now in the 80s previously in the 150s.  No need for statin therapy.  Continue with nutritional strategies.Marland Kitchen

## 2022-10-11 ENCOUNTER — Encounter (INDEPENDENT_AMBULATORY_CARE_PROVIDER_SITE_OTHER): Payer: Self-pay | Admitting: Internal Medicine

## 2022-10-11 ENCOUNTER — Ambulatory Visit (INDEPENDENT_AMBULATORY_CARE_PROVIDER_SITE_OTHER): Payer: BC Managed Care – PPO | Admitting: Internal Medicine

## 2022-10-11 VITALS — BP 108/69 | HR 65 | Temp 98.5°F | Ht 66.0 in | Wt 178.0 lb

## 2022-10-11 DIAGNOSIS — E668 Other obesity: Secondary | ICD-10-CM

## 2022-10-11 DIAGNOSIS — Z6828 Body mass index (BMI) 28.0-28.9, adult: Secondary | ICD-10-CM

## 2022-10-11 DIAGNOSIS — E559 Vitamin D deficiency, unspecified: Secondary | ICD-10-CM

## 2022-10-11 DIAGNOSIS — R7303 Prediabetes: Secondary | ICD-10-CM | POA: Diagnosis not present

## 2022-10-11 DIAGNOSIS — Z723 Lack of physical exercise: Secondary | ICD-10-CM | POA: Insufficient documentation

## 2022-10-11 DIAGNOSIS — E669 Obesity, unspecified: Secondary | ICD-10-CM

## 2022-10-11 DIAGNOSIS — E7800 Pure hypercholesterolemia, unspecified: Secondary | ICD-10-CM | POA: Diagnosis not present

## 2022-10-11 MED ORDER — METFORMIN HCL ER 500 MG PO TB24: 500.0000 mg | ORAL_TABLET | Freq: Every day | ORAL | 0 refills | Status: DC

## 2022-10-11 NOTE — Progress Notes (Signed)
Office: [phone]  /  Fax: [phone]  WEIGHT SUMMARY AND BIOMETRICS  Vitals Temp: 98.5 F (36.9 C) BP: 108/69 Pulse Rate: 65 SpO2: 100 %   Anthropometric Measurements Height: 5\' 6"  (1.676 m) Weight: 178 lb (80.7 kg) BMI (Calculated): 28.74 Weight at Last Visit: 180lb Weight Lost Since Last Visit: 2lb Weight Gained Since Last Visit: 0lb Starting Weight: 186lb Total Weight Loss (lbs): 6 lb (2.722 kg)   Body Composition  Body Fat %: 37.8 % Fat Mass (lbs): 67.4 lbs Muscle Mass (lbs): 105.4 lbs Total Body Water (lbs): 73.6 lbs Visceral Fat Rating : 7    No data recorded Today's Visit #: 6  Starting Date: 06/22/22   HPI  Chief Complaint: OBESITY  [name] is here to discuss her progress with her obesity treatment plan. She is on the keeping a food journal and adhering to recommended goals of 1200 calories and 90 protein and states she is following her eating plan approximately 75 % of the time. She states she is exercising 0 minutes 0 times per week.  Interval History:  Since last office visit she has lost 2 lbs. Started metformin  once a day last visit. No SE. She reports fair adherence to reduced calorie nutritional plan. She has been working on not skipping meals, eating more fruits, journaling and tracking calories, and not getting as much vegetables. Not exercising because it is hot. Orixegenic Control: Denies problems with appetite and hunger signals.  Denies problems with satiety and satiation.  Denies problems with eating patterns and portion control.  Denies abnormal cravings. Denies feeling deprived or restricted.   Barriers identified: physical inactivity.   Pharmacotherapy for weight loss: She is currently taking Metformin (off label use for incretin effect and / or insulin resistance and / or diabetes prevention) with adequate clinical response  and without side effects..    ASSESSMENT AND PLAN  TREATMENT PLAN FOR OBESITY:  Recommended  Dietary Goals  [name] is currently in the action stage of change. As such, her goal is to continue weight management plan. She has agreed to: continue current plan  Behavioral Intervention  We discussed the following Behavioral Modification Strategies today: increasing lean protein intake, decreasing simple carbohydrates , increasing vegetables, increasing lower glycemic fruits, increasing fiber rich foods, increasing water intake, continue to practice mindfulness when eating, and planning for success.  Additional resources provided today: Handout on increasing daily activity and exercise goal setting  Recommended Physical Activity Goals  [name] has been advised to work up to 150 minutes of moderate intensity aerobic activity a week and strengthening exercises 2-3 times per week for cardiovascular health, weight loss maintenance and preservation of muscle mass.   She has agreed to :  Think about ways to increase daily physical activity and overcoming barriers to exercise and Increase physical activity in their day and reduce sedentary time (increase NEAT).  Pharmacotherapy We discussed various medication options to help [name] with her weight loss efforts and we both agreed to : continue current anti-obesity medication regimen  ASSOCIATED CONDITIONS ADDRESSED TODAY  Class 1 obesity with serious comorbidity and body mass index (BMI) of 31.0 to 31.9 in adult, unspecified obesity type Assessment & Plan: [name] has lost 16 pounds or 9% of total body weight.  She has a body fat percentage down to 37% and has had relative preservation of muscle mass.  We again emphasized the importance of getting 30 to 40 mg of protein per meal and also starting an exercise regimen.  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  We provided her with counseling and handout on increasing physical activity.   Prediabetes -     metFORMIN HCl ER; Take 1  tablet (500 mg total) by mouth daily with breakfast.  Dispense: 30 tablet; Refill: 0  Vitamin D deficiency Assessment & Plan: Vitamin D levels improved. In the 47' from the 10's. Taking 5,000 QIW. No SE. Continue supplementation.   Elevated LDL cholesterol level Assessment & Plan: Significant improvement now down to 86 from 150's with nutritional changes. She had cut down avocados and nuts. Eats nuts in moderation.  Metformin might have helped in addition to close to 10% of body weight loss.  She will continue to maintain a diet low in saturated fats.   Physically inactive Assessment & Plan: Counseled on the harms of physical inactivity and the benefits of increasing physical activity especially for weight loss maintenance.     PHYSICAL EXAM:  Blood pressure 108/69, pulse 65, temperature 98.5 F (36.9 C), height 5\' 6"  (1.676 m), weight 178 lb (80.7 kg), last menstrual period 08/16/2022, SpO2 100 %. Body mass index is 28.73 kg/m.  General: She is overweight, cooperative, alert, well developed, and in no acute distress. PSYCH: Has normal mood, affect and thought process.   HEENT: EOMI, sclerae are anicteric. Lungs: Normal breathing effort, no conversational dyspnea. Extremities: No edema.  Neurologic: No gross sensory or motor deficits. No tremors or fasciculations noted.    DIAGNOSTIC DATA REVIEWED:  BMET    Component Value Date/Time   NA 138 06/08/2022 1526   K 3.9 06/08/2022 1526   CL 101 06/08/2022 1526   CO2 18 (L) 06/08/2022 1526   GLUCOSE 73 06/08/2022 1526   GLUCOSE 103 (H) 01/14/2017 0442   BUN 7 06/08/2022 1526   CREATININE 0.69 06/08/2022 1526   CALCIUM 9.3 06/08/2022 1526   GFRNONAA 114 04/07/2018 1433   GFRAA 132 04/07/2018 1433   Lab Results  Component Value Date   HGBA1C 5.7 (H) 09/14/2022   HGBA1C 5.4 07/09/2020   Lab Results  Component Value Date   INSULIN 6.6 06/08/2022   Lab Results  Component Value Date   TSH 0.572 05/03/2022   CBC     Component Value Date/Time   WBC 4.6 05/03/2022 1636   WBC 8.3 01/05/2021 1132   RBC 4.32 05/03/2022 1636   RBC 3.37 (L) 01/05/2021 1132   HGB 12.3 05/03/2022 1636   HGB 11.4 02/08/2013 0000   HCT 37.3 05/03/2022 1636   HCT 33 02/08/2013 0000   PLT 212 05/03/2022 1636   PLT 174 02/08/2013 0000   MCV 86 05/03/2022 1636   MCH 28.5 05/03/2022 1636   MCH 26.7 01/05/2021 1132   MCHC 33.0 05/03/2022 1636   MCHC 32.0 01/05/2021 1132   RDW 12.3 05/03/2022 1636   Iron Studies    Component Value Date/Time   IRON 63 04/21/2021 1147   TIBC 311 04/21/2021 1147   FERRITIN 40 04/21/2021 1147   IRONPCTSAT 20 04/21/2021 1147   Lipid Panel     Component Value Date/Time   CHOL 227 (H) 06/08/2022 1526   TRIG 94 06/08/2022 1526   HDL 57 06/08/2022 1526   CHOLHDL 2.8 05/03/2022 1636   LDLCALC 153 (H) 06/08/2022 1526   LDLDIRECT 85 09/14/2022 1649   Hepatic Function Panel     Component Value Date/Time   PROT 7.5 06/08/2022 1526  ALBUMIN 4.5 06/08/2022 1526   AST 25 06/08/2022 1526   ALT 26 06/08/2022 1526   ALKPHOS 57 06/08/2022 1526   BILITOT 0.4 06/08/2022 1526      Component Value Date/Time   TSH 0.572 05/03/2022 1636   Nutritional Lab Results  Component Value Date   VD25OH 42.2 09/14/2022   VD25OH 17.8 (L) 05/03/2022   VD25OH 12.9 (L) 04/21/2021     Return in about 3 weeks (around 11/01/2022) for For Weight Mangement with Dr. Rikki Spearing.Marland Kitchen She was informed of the importance of frequent follow up visits to maximize her success with intensive lifestyle modifications for her multiple health conditions.   ATTESTASTION STATEMENTS:  Reviewed by clinician on day of visit: allergies, medications, problem list, medical history, surgical history, family history, social history, and previous encounter notes.     Worthy Rancher, MD

## 2022-10-11 NOTE — Assessment & Plan Note (Signed)
Vitamin D levels improved. In the 79' from the 10's. Taking 5,000 QIW. No SE. Continue supplementation.

## 2022-10-11 NOTE — Assessment & Plan Note (Addendum)
Significant improvement now down to 86 from 150's with nutritional changes. She had cut down avocados and nuts. Eats nuts in moderation.  Metformin might have helped in addition to close to 10% of body weight loss.  She will continue to maintain a diet low in saturated fats.

## 2022-10-11 NOTE — Assessment & Plan Note (Signed)
Betti has lost 16 pounds or 9% of total body weight.  She has a body fat percentage down to 37% and has had relative preservation of muscle mass.  We again emphasized the importance of getting 30 to 40 mg of protein per meal and also starting an exercise regimen.  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  We provided her with counseling and handout on increasing physical activity.

## 2022-10-11 NOTE — Assessment & Plan Note (Signed)
Counseled on the harms of physical inactivity and the benefits of increasing physical activity especially for weight loss maintenance.

## 2022-11-01 ENCOUNTER — Encounter (INDEPENDENT_AMBULATORY_CARE_PROVIDER_SITE_OTHER): Payer: Self-pay | Admitting: Internal Medicine

## 2022-11-01 ENCOUNTER — Encounter (INDEPENDENT_AMBULATORY_CARE_PROVIDER_SITE_OTHER): Payer: BC Managed Care – PPO | Admitting: Internal Medicine

## 2022-11-01 DIAGNOSIS — R7303 Prediabetes: Secondary | ICD-10-CM

## 2022-11-01 NOTE — Progress Notes (Unsigned)
Office: [phone]  /  Fax: [phone]  WEIGHT SUMMARY AND BIOMETRICS  Vitals Temp: 98.2 F (36.8 C) BP: 108/71 Pulse Rate: 76 SpO2: 98 %   Anthropometric Measurements Height: 5\' 6"  (1.676 m) Weight: 177 lb (80.3 kg) BMI (Calculated): 28.58 Weight at Last Visit: 178 lb Weight Lost Since Last Visit: 0 lb Weight Gained Since Last Visit: 0 lb Starting Weight: 186 lb Total Weight Loss (lbs): 9 lb (4.082 kg) Peak Weight: 194 lb   Body Composition  Body Fat %: 37.1 % Fat Mass (lbs): 65.8 lbs Muscle Mass (lbs): 105.8 lbs Total Body Water (lbs): 74.2 lbs Visceral Fat Rating : 7    No data recorded Today's Visit #: 7  Starting Date: 06/15/22   HPI  Chief Complaint: OBESITY  [name] is here to discuss her progress with her obesity treatment plan. She is on the keeping a food journal and adhering to recommended goals of 1200 calories and 90 protein and states she is following her eating plan approximately 70 % of the time. She states she is not exercising.  Interval History:  Since last office visit she has ***. She reports {EMADHERENCE:28838::"good adherence to reduced calorie nutritional plan."} She has been working on [employer]: {ACTIONS;DENIES/REPORTS:21021675::"Denies"} problems with appetite and hunger signals.  {ACTIONS;DENIES/REPORTS:21021675::"Denies"} problems with satiety and satiation.  {ACTIONS;DENIES/REPORTS:21021675::"Denies"} problems with eating patterns and portion control.  {ACTIONS;DENIES/REPORTS:21021675::"Denies"} abnormal cravings. {ACTIONS;DENIES/REPORTS:21021675::"Denies"} feeling deprived or restricted.   Barriers identified: {EMOBESITYBARRIERS:28841}.   Pharmacotherapy for weight loss: She is currently taking {EMPharmaco:28845}.    ASSESSMENT AND PLAN  TREATMENT PLAN FOR OBESITY:  Recommended Dietary Goals  [name] is currently in the action stage of change. As such, her goal is to continue weight  management plan. She has agreed to: {EMWTLOSSPLAN:29297::"continue current plan"}  Behavioral Intervention  We discussed the following Behavioral Modification Strategies today: {EMWMwtlossstrategies:28914::"increasing lean protein intake","decreasing simple carbohydrates ","increasing vegetables","increasing lower glycemic fruits","increasing water intake","continue to practice mindfulness when eating","planning for success"}.  Additional resources provided today: {EMadditionalresources:29169::"None"}  Recommended Physical Activity Goals  [name] has been advised to work up to 150 minutes of moderate intensity aerobic activity a week and strengthening exercises 2-3 times per week for cardiovascular health, weight loss maintenance and preservation of muscle mass.   She has agreed to :  {EMEXERCISE:28847::"Think about ways to increase daily physical activity and overcoming barriers to exercise"}  Pharmacotherapy We discussed various medication options to help [name] with her weight loss efforts and we both agreed to : {EMagreedrx:29170::"continue with nutritional and behavioral strategies"}  ASSOCIATED CONDITIONS ADDRESSED TODAY  Erroneous encounter - disregard  Prediabetes    PHYSICAL EXAM:  Blood pressure 108/71, pulse 76, temperature 98.2 F (36.8 C), height 5\' 6"  (1.676 m), weight 177 lb (80.3 kg), SpO2 98%. Body mass index is 28.57 kg/m.  General: She is overweight, cooperative, alert, well developed, and in no acute distress. PSYCH: Has normal mood, affect and thought process.   HEENT: EOMI, sclerae are anicteric. Lungs: Normal breathing effort, no conversational dyspnea. Extremities: No edema.  Neurologic: No gross sensory or motor deficits. No tremors or fasciculations noted.    DIAGNOSTIC DATA REVIEWED:  BMET    Component Value Date/Time   NA 138 06/08/2022 1526   K 3.9 06/08/2022 1526   CL 101 06/08/2022 1526   CO2 18 (L) 06/08/2022 1526   GLUCOSE 73 06/08/2022  1526   GLUCOSE 103 (H) 01/14/2017 0442   BUN 7 06/08/2022 1526   CREATININE 0.69 06/08/2022 1526   CALCIUM 9.3 06/08/2022  1526   GFRNONAA 114 04/07/2018 1433   GFRAA 132 04/07/2018 1433   Lab Results  Component Value Date   HGBA1C 5.7 (H) 09/14/2022   HGBA1C 5.4 07/09/2020   Lab Results  Component Value Date   INSULIN 6.6 06/08/2022   Lab Results  Component Value Date   TSH 0.572 05/03/2022   CBC    Component Value Date/Time   WBC 4.6 05/03/2022 1636   WBC 8.3 01/05/2021 1132   RBC 4.32 05/03/2022 1636   RBC 3.37 (L) 01/05/2021 1132   HGB 12.3 05/03/2022 1636   HGB 11.4 02/08/2013 0000   HCT 37.3 05/03/2022 1636   HCT 33 02/08/2013 0000   PLT 212 05/03/2022 1636   PLT 174 02/08/2013 0000   MCV 86 05/03/2022 1636   MCH 28.5 05/03/2022 1636   MCH 26.7 01/05/2021 1132   MCHC 33.0 05/03/2022 1636   MCHC 32.0 01/05/2021 1132   RDW 12.3 05/03/2022 1636   Iron Studies    Component Value Date/Time   IRON 63 04/21/2021 1147   TIBC 311 04/21/2021 1147   FERRITIN 40 04/21/2021 1147   IRONPCTSAT 20 04/21/2021 1147   Lipid Panel     Component Value Date/Time   CHOL 227 (H) 06/08/2022 1526   TRIG 94 06/08/2022 1526   HDL 57 06/08/2022 1526   CHOLHDL 2.8 05/03/2022 1636   LDLCALC 153 (H) 06/08/2022 1526   LDLDIRECT 85 09/14/2022 1649   Hepatic Function Panel     Component Value Date/Time   PROT 7.5 06/08/2022 1526   ALBUMIN 4.5 06/08/2022 1526   AST 25 06/08/2022 1526   ALT 26 06/08/2022 1526   ALKPHOS 57 06/08/2022 1526   BILITOT 0.4 06/08/2022 1526      Component Value Date/Time   TSH 0.572 05/03/2022 1636   Nutritional Lab Results  Component Value Date   VD25OH 42.2 09/14/2022   VD25OH 17.8 (L) 05/03/2022   VD25OH 12.9 (L) 04/21/2021     No follow-ups on file.Marland Kitchen She was informed of the importance of frequent follow up visits to maximize her success with intensive lifestyle modifications for her multiple health conditions.   ATTESTASTION  STATEMENTS:  Reviewed by clinician on day of visit: allergies, medications, problem list, medical history, surgical history, family history, social history, and previous encounter notes.     Worthy Rancher, MD

## 2022-11-02 NOTE — Progress Notes (Signed)
This encounter was created in error - please disregard.

## 2022-11-20 ENCOUNTER — Other Ambulatory Visit (INDEPENDENT_AMBULATORY_CARE_PROVIDER_SITE_OTHER): Payer: Self-pay | Admitting: Internal Medicine

## 2022-11-20 DIAGNOSIS — R7303 Prediabetes: Secondary | ICD-10-CM

## 2022-11-29 ENCOUNTER — Encounter (INDEPENDENT_AMBULATORY_CARE_PROVIDER_SITE_OTHER): Payer: Self-pay | Admitting: Internal Medicine

## 2022-11-29 ENCOUNTER — Ambulatory Visit (INDEPENDENT_AMBULATORY_CARE_PROVIDER_SITE_OTHER): Payer: BC Managed Care – PPO | Admitting: Internal Medicine

## 2022-11-29 VITALS — BP 110/76 | HR 94 | Temp 98.7°F | Ht 66.0 in | Wt 176.0 lb

## 2022-11-29 DIAGNOSIS — R7303 Prediabetes: Secondary | ICD-10-CM

## 2022-11-29 DIAGNOSIS — E7800 Pure hypercholesterolemia, unspecified: Secondary | ICD-10-CM | POA: Diagnosis not present

## 2022-11-29 DIAGNOSIS — E668 Other obesity: Secondary | ICD-10-CM

## 2022-11-29 DIAGNOSIS — E66811 Obesity, class 1: Secondary | ICD-10-CM

## 2022-11-29 DIAGNOSIS — E669 Obesity, unspecified: Secondary | ICD-10-CM

## 2022-11-29 DIAGNOSIS — Z6828 Body mass index (BMI) 28.0-28.9, adult: Secondary | ICD-10-CM

## 2022-11-29 MED ORDER — METFORMIN HCL ER 500 MG PO TB24
500.0000 mg | ORAL_TABLET | Freq: Every day | ORAL | 0 refills | Status: DC
Start: 2022-11-29 — End: 2023-02-21

## 2022-11-29 NOTE — Assessment & Plan Note (Signed)
Her most recent hemoglobin A1c is unchanged at 5.7.  She has been feeling weak in the evening and thinks is related to the metformin.  I do not think is medication related but patient advised to monitor for worsening weakness and to discontinue medication if that is the case.

## 2022-11-29 NOTE — Assessment & Plan Note (Signed)
[name] has lost 17 pounds or 9% of total body weight.  She has a body fat percentage down to 36% and has had relative preservation of muscle mass.  We again emphasized the importance of getting 30 to 40 mg of protein per meal and also starting an exercise regimen.  Her physical activity levels are low, her job is sedentary.  This will help her with her weight loss progress.

## 2022-11-29 NOTE — Progress Notes (Signed)
Office: [phone]  /  Fax: [phone]  WEIGHT SUMMARY AND BIOMETRICS  Vitals Temp: 98.7 F (37.1 C) BP: 110/76 Pulse Rate: 94 SpO2: 100 %   Anthropometric Measurements Height: 5\' 6"  (1.676 m) Weight: 176 lb (79.8 kg) BMI (Calculated): 28.42 Weight at Last Visit: 177 lb Weight Lost Since Last Visit: 1 lb Weight Gained Since Last Visit: 0 lb Starting Weight: 186 lb Total Weight Loss (lbs): 10 lb (4.536 kg) Peak Weight: 194 lb   Body Composition  Body Fat %: 36.1 % Fat Mass (lbs): 63.6 lbs Muscle Mass (lbs): 106.8 lbs Total Body Water (lbs): 73.8 lbs Visceral Fat Rating : 6    No data recorded Today's Visit #: 8  Starting Date: 06/15/22   HPI  Chief Complaint: OBESITY  [name] is here to discuss her progress with her obesity treatment plan. She is on her own plan and states she is following her eating plan approximately 70 % of the time. She states she is exercising 10 minutes 2 times per week.  Interval History:  Since last office visit she has lost 1 lb. Has noticed some tiredness in the evening when taking metformin. Did not occur when she did not take.  She reports good adherence to reduced calorie nutritional plan. She has been working on not skipping meals, begun to exercise, and using protein shakes and protein bar.   Orixegenic Control: Denies problems with appetite and hunger signals.  Denies problems with satiety and satiation.  Denies problems with eating patterns and portion control.  Denies abnormal cravings. Denies feeling deprived or restricted.   Barriers identified: low volume of physical acitivity and sedentary job .   Pharmacotherapy for weight loss: She is currently taking Metformin (off label use for incretin effect and / or insulin resistance and / or diabetes prevention) with adequate clinical response  and experiencing the following side effects: some weakness in the evening..    ASSESSMENT AND PLAN  TREATMENT PLAN FOR  OBESITY:  Recommended Dietary Goals  [name] is currently in the action stage of change. As such, her goal is to continue weight management plan. She has agreed to: continue current plan  Behavioral Intervention  We discussed the following Behavioral Modification Strategies today: increasing lean protein intake, decreasing simple carbohydrates , increasing vegetables, increasing lower glycemic fruits, avoiding skipping meals, increasing water intake, and planning for success.  Additional resources provided today: None  Recommended Physical Activity Goals  [name] has been advised to work up to 150 minutes of moderate intensity aerobic activity a week and strengthening exercises 2-3 times per week for cardiovascular health, weight loss maintenance and preservation of muscle mass.   She has agreed to :  Think about ways to increase daily physical activity and overcoming barriers to exercise and Increase physical activity in their day and reduce sedentary time (increase NEAT).  Pharmacotherapy We discussed various medication options to help [name] with her weight loss efforts and we both agreed to : continue current anti-obesity medication regimen  ASSOCIATED CONDITIONS ADDRESSED TODAY  Class 1 obesity with serious comorbidity and body mass index (BMI) of 31.0 to 31.9 in adult, unspecified obesity type Assessment & Plan: [name] has lost 17 pounds or 9% of total body weight.  She has a body fat percentage down to 36% and has had relative preservation of muscle mass.  We again emphasized the importance of getting 30 to 40 mg of protein per meal and also starting an exercise regimen.  Her physical activity levels are  low, her job is sedentary.  This will help her with her weight loss progress.   Prediabetes Assessment & Plan: Her most recent hemoglobin A1c is unchanged at 5.7.  She has been feeling weak in the evening and thinks is related to the metformin.  I do not think is medication related but  patient advised to monitor for worsening weakness and to discontinue medication if that is the case.  Orders: -     metFORMIN HCl ER; Take 1 tablet (500 mg total) by mouth daily with breakfast.  Dispense: 30 tablet; Refill: 0  Elevated LDL cholesterol level Assessment & Plan: Her most recent LDL is 85 down from 153.  She is not on statin therapy.  Continue with nutrition and behavioral strategies for weight management.     PHYSICAL EXAM:  Blood pressure 110/76, pulse 94, temperature 98.7 F (37.1 C), height 5\' 6"  (1.676 m), weight 176 lb (79.8 kg), SpO2 100%. Body mass index is 28.41 kg/m.  General: She is overweight, cooperative, alert, well developed, and in no acute distress. PSYCH: Has normal mood, affect and thought process.   HEENT: EOMI, sclerae are anicteric. Lungs: Normal breathing effort, no conversational dyspnea. Extremities: No edema.  Neurologic: No gross sensory or motor deficits. No tremors or fasciculations noted.    DIAGNOSTIC DATA REVIEWED:  BMET    Component Value Date/Time   NA 138 06/08/2022 1526   K 3.9 06/08/2022 1526   CL 101 06/08/2022 1526   CO2 18 (L) 06/08/2022 1526   GLUCOSE 73 06/08/2022 1526   GLUCOSE 103 (H) 01/14/2017 0442   BUN 7 06/08/2022 1526   CREATININE 0.69 06/08/2022 1526   CALCIUM 9.3 06/08/2022 1526   GFRNONAA 114 04/07/2018 1433   GFRAA 132 04/07/2018 1433   Lab Results  Component Value Date   HGBA1C 5.7 (H) 09/14/2022   HGBA1C 5.4 07/09/2020   Lab Results  Component Value Date   INSULIN 6.6 06/08/2022   Lab Results  Component Value Date   TSH 0.572 05/03/2022   CBC    Component Value Date/Time   WBC 4.6 05/03/2022 1636   WBC 8.3 01/05/2021 1132   RBC 4.32 05/03/2022 1636   RBC 3.37 (L) 01/05/2021 1132   HGB 12.3 05/03/2022 1636   HGB 11.4 02/08/2013 0000   HCT 37.3 05/03/2022 1636   HCT 33 02/08/2013 0000   PLT 212 05/03/2022 1636   PLT 174 02/08/2013 0000   MCV 86 05/03/2022 1636   MCH 28.5 05/03/2022  1636   MCH 26.7 01/05/2021 1132   MCHC 33.0 05/03/2022 1636   MCHC 32.0 01/05/2021 1132   RDW 12.3 05/03/2022 1636   Iron Studies    Component Value Date/Time   IRON 63 04/21/2021 1147   TIBC 311 04/21/2021 1147   FERRITIN 40 04/21/2021 1147   IRONPCTSAT 20 04/21/2021 1147   Lipid Panel     Component Value Date/Time   CHOL 227 (H) 06/08/2022 1526   TRIG 94 06/08/2022 1526   HDL 57 06/08/2022 1526   CHOLHDL 2.8 05/03/2022 1636   LDLCALC 153 (H) 06/08/2022 1526   LDLDIRECT 85 09/14/2022 1649   Hepatic Function Panel     Component Value Date/Time   PROT 7.5 06/08/2022 1526   ALBUMIN 4.5 06/08/2022 1526   AST 25 06/08/2022 1526   ALT 26 06/08/2022 1526   ALKPHOS 57 06/08/2022 1526   BILITOT 0.4 06/08/2022 1526      Component Value Date/Time   TSH 0.572 05/03/2022 1636  Nutritional Lab Results  Component Value Date   VD25OH 42.2 09/14/2022   VD25OH 17.8 (L) 05/03/2022   VD25OH 12.9 (L) 04/21/2021     Return for Week of Sept 30th - 5 weeks - with Dr. Rikki Spearing.Marland Kitchen She was informed of the importance of frequent follow up visits to maximize her success with intensive lifestyle modifications for her multiple health conditions.   ATTESTASTION STATEMENTS:  Reviewed by clinician on day of visit: allergies, medications, problem list, medical history, surgical history, family history, social history, and previous encounter notes.     Worthy Rancher, MD

## 2022-11-29 NOTE — Assessment & Plan Note (Signed)
Her most recent LDL is 85 down from 153.  She is not on statin therapy.  Continue with nutrition and behavioral strategies for weight management.

## 2023-01-10 ENCOUNTER — Ambulatory Visit (INDEPENDENT_AMBULATORY_CARE_PROVIDER_SITE_OTHER): Payer: BC Managed Care – PPO | Admitting: Internal Medicine

## 2023-01-24 ENCOUNTER — Encounter (INDEPENDENT_AMBULATORY_CARE_PROVIDER_SITE_OTHER): Payer: Self-pay | Admitting: Internal Medicine

## 2023-01-24 ENCOUNTER — Ambulatory Visit (INDEPENDENT_AMBULATORY_CARE_PROVIDER_SITE_OTHER): Payer: BC Managed Care – PPO | Admitting: Internal Medicine

## 2023-01-24 VITALS — BP 107/68 | HR 64 | Temp 97.6°F | Ht 66.0 in | Wt 174.0 lb

## 2023-01-24 DIAGNOSIS — Z6831 Body mass index (BMI) 31.0-31.9, adult: Secondary | ICD-10-CM

## 2023-01-24 DIAGNOSIS — R7303 Prediabetes: Secondary | ICD-10-CM

## 2023-01-24 DIAGNOSIS — E66811 Obesity, class 1: Secondary | ICD-10-CM | POA: Diagnosis not present

## 2023-01-24 NOTE — Assessment & Plan Note (Signed)
 See obesity treatment plan

## 2023-01-24 NOTE — Assessment & Plan Note (Signed)
She has completed 3-4 months of metformin , had run out about 3 weeks ago.  We are checking hemoglobin A1c and vitamin B12 levels today.  Consider resuming medication if her A1c is in the prediabetes range.

## 2023-01-24 NOTE — Progress Notes (Signed)
Office: [phone]  /  Fax: [phone]  WEIGHT SUMMARY AND BIOMETRICS  Vitals Temp: 97.6 F (36.4 C) BP: 107/68 Pulse Rate: 64 SpO2: 100 %   Anthropometric Measurements Height: 5\' 6"  (1.676 m) Weight: 174 lb (78.9 kg) BMI (Calculated): 28.1 Weight at Last Visit: 176 lb Weight Lost Since Last Visit: 2 lb Weight Gained Since Last Visit: 0 lb Starting Weight: 186 lb Total Weight Loss (lbs): 12 lb (5.443 kg) Peak Weight: 194 lb   Body Composition  Body Fat %: 36.8 % Fat Mass (lbs): 64.4 lbs Muscle Mass (lbs): 104.8 lbs Total Body Water (lbs): 72.6 lbs Visceral Fat Rating : 6    No data recorded Today's Visit #: 9  Starting Date: 06/15/22   HPI  Chief Complaint: OBESITY  [name] is here to discuss her progress with her obesity treatment plan. She is on the keeping a food journal and adhering to recommended goals of 1200 calories and 90 protein and states she is following her eating plan approximately 70 % of the time. She states she is exercising 30 minutes 2 times per week.  Interval History:   Discussed the use of AI scribe software for clinical note transcription with the patient, who gave verbal consent to proceed.  History of Present Illness    The patient presents for a follow-up visit regarding weight management. She reports a weight loss of 20 pounds since January, which she attributes to dietary changes and increased physical activity. She has been consuming [place] yogurt with a protein-rich granola mix for breakfast, a protein shake and fruit for lunch, and a balanced meal of rice, vegetables, and lean protein for dinner. She has also started exercising at the [facility], primarily using the treadmill for about 20 minutes twice a week.  The patient had been on metformin for about three months but stopped taking it three weeks prior to the visit due to feelings of weakness in the evenings. However, she reports that this side effect seemed to improve before  she stopped the medication. She expresses a willingness to restart the metformin, depending on the results of her upcoming A1c test.  Despite her progress, the patient acknowledges that she could improve her dietary habits, particularly in terms of increasing her fruit and vegetable intake. She also expresses a desire to maintain her current weight loss trajectory and continue making lifestyle changes to reach her health goals.       Orexigenic Control: Denies problems with appetite and hunger signals.  Denies problems with satiety and satiation.  Denies problems with eating patterns and portion control.  Denies abnormal cravings. Denies feeling deprived or restricted.   Barriers identified: low volume of physical activity at present .   Pharmacotherapy for weight loss: She is currently taking Metformin (off label use for incretin effect and / or insulin resistance and / or diabetes prevention) with adequate clinical response  and without side effects..    ASSESSMENT AND PLAN  TREATMENT PLAN FOR OBESITY:  Recommended Dietary Goals  [name] is currently in the action stage of change. As such, her goal is to continue weight management plan. She has agreed to: continue current plan  Behavioral Intervention  We discussed the following Behavioral Modification Strategies today: continue to work on maintaining a reduced calorie state, getting the recommended amount of protein, incorporating whole foods, making healthy choices, staying well hydrated and practicing mindfulness when eating..  Additional resources provided today: None  Recommended Physical Activity Goals  [name] has been advised  to work up to 150 minutes of moderate intensity aerobic activity a week and strengthening exercises 2-3 times per week for cardiovascular health, weight loss maintenance and preservation of muscle mass.   She has agreed to :  Think about enjoyable ways to increase daily physical activity and overcoming  barriers to exercise and Increase physical activity in their day and reduce sedentary time (increase NEAT).  Pharmacotherapy We discussed various medication options to help [name] with her weight loss efforts and we both agreed to : continue with nutritional and behavioral strategies  ASSOCIATED CONDITIONS ADDRESSED TODAY  Prediabetes Assessment & Plan: She has completed 3-4 months of metformin , had run out about 3 weeks ago.  We are checking hemoglobin A1c and vitamin B12 levels today.  Consider resuming medication if her A1c is in the prediabetes range.  Orders: -     Hemoglobin A1c -     Vitamin B12  Class 1 obesity with serious comorbidity and body mass index (BMI) of 31.0 to 31.9 in adult, unspecified obesity type Assessment & Plan: See obesity treatment plan     PHYSICAL EXAM:  Blood pressure 107/68, pulse 64, temperature 97.6 F (36.4 C), height 5\' 6"  (1.676 m), weight 174 lb (78.9 kg), last menstrual period 01/19/2023, SpO2 100%. Body mass index is 28.08 kg/m.  General: She is overweight, cooperative, alert, well developed, and in no acute distress. PSYCH: Has normal mood, affect and thought process.   HEENT: EOMI, sclerae are anicteric. Lungs: Normal breathing effort, no conversational dyspnea. Extremities: No edema.  Neurologic: No gross sensory or motor deficits. No tremors or fasciculations noted.    DIAGNOSTIC DATA REVIEWED:  BMET    Component Value Date/Time   NA 138 06/08/2022 1526   K 3.9 06/08/2022 1526   CL 101 06/08/2022 1526   CO2 18 (L) 06/08/2022 1526   GLUCOSE 73 06/08/2022 1526   GLUCOSE 103 (H) 01/14/2017 0442   BUN 7 06/08/2022 1526   CREATININE 0.69 06/08/2022 1526   CALCIUM 9.3 06/08/2022 1526   GFRNONAA 114 04/07/2018 1433   GFRAA 132 04/07/2018 1433   Lab Results  Component Value Date   HGBA1C 5.7 (H) 09/14/2022   HGBA1C 5.4 07/09/2020   Lab Results  Component Value Date   INSULIN 6.6 06/08/2022   Lab Results  Component Value  Date   TSH 0.572 05/03/2022   CBC    Component Value Date/Time   WBC 4.6 05/03/2022 1636   WBC 8.3 01/05/2021 1132   RBC 4.32 05/03/2022 1636   RBC 3.37 (L) 01/05/2021 1132   HGB 12.3 05/03/2022 1636   HGB 11.4 02/08/2013 0000   HCT 37.3 05/03/2022 1636   HCT 33 02/08/2013 0000   PLT 212 05/03/2022 1636   PLT 174 02/08/2013 0000   MCV 86 05/03/2022 1636   MCH 28.5 05/03/2022 1636   MCH 26.7 01/05/2021 1132   MCHC 33.0 05/03/2022 1636   MCHC 32.0 01/05/2021 1132   RDW 12.3 05/03/2022 1636   Iron Studies    Component Value Date/Time   IRON 63 04/21/2021 1147   TIBC 311 04/21/2021 1147   FERRITIN 40 04/21/2021 1147   IRONPCTSAT 20 04/21/2021 1147   Lipid Panel     Component Value Date/Time   CHOL 227 (H) 06/08/2022 1526   TRIG 94 06/08/2022 1526   HDL 57 06/08/2022 1526   CHOLHDL 2.8 05/03/2022 1636   LDLCALC 153 (H) 06/08/2022 1526   LDLDIRECT 85 09/14/2022 1649   Hepatic Function Panel  Component Value Date/Time   PROT 7.5 06/08/2022 1526   ALBUMIN 4.5 06/08/2022 1526   AST 25 06/08/2022 1526   ALT 26 06/08/2022 1526   ALKPHOS 57 06/08/2022 1526   BILITOT 0.4 06/08/2022 1526      Component Value Date/Time   TSH 0.572 05/03/2022 1636   Nutritional Lab Results  Component Value Date   VD25OH 42.2 09/14/2022   VD25OH 17.8 (L) 05/03/2022   VD25OH 12.9 (L) 04/21/2021     Return in about 4 weeks (around 02/21/2023).Marland Kitchen She was informed of the importance of frequent follow up visits to maximize her success with intensive lifestyle modifications for her multiple health conditions.   ATTESTASTION STATEMENTS:  Reviewed by clinician on day of visit: allergies, medications, problem list, medical history, surgical history, family history, social history, and previous encounter notes.     Worthy Rancher, MD

## 2023-02-01 LAB — HEMOGLOBIN A1C
Est. average glucose Bld gHb Est-mCnc: 114 mg/dL
Hgb A1c MFr Bld: 5.6 % (ref 4.8–5.6)

## 2023-02-01 LAB — VITAMIN B12: Vitamin B-12: 838 pg/mL (ref 232–1245)

## 2023-02-11 IMAGING — US US MFM OB FOLLOW-UP
1 series · 13 of 28 positions shown · non-contrast
Comparison: none

[Series 1: us mfm ob follow-up · 66 acquisitions, 13 frames shown]
[im 3/66]
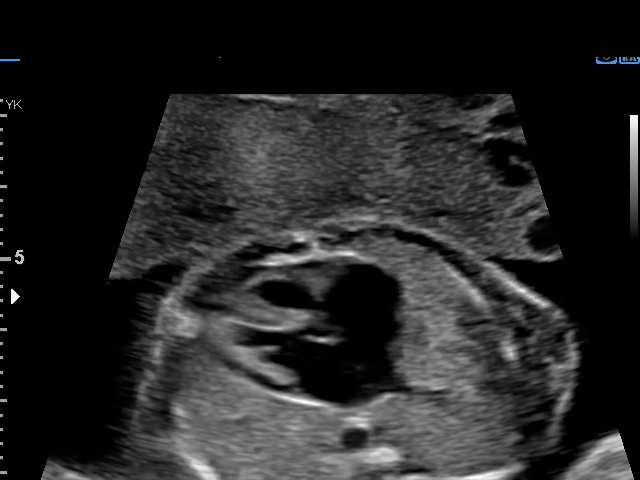
[im 8/66]
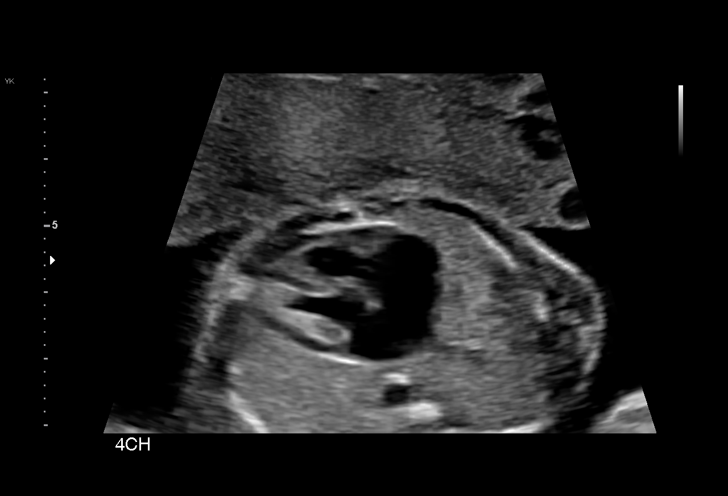
[im 13/66]
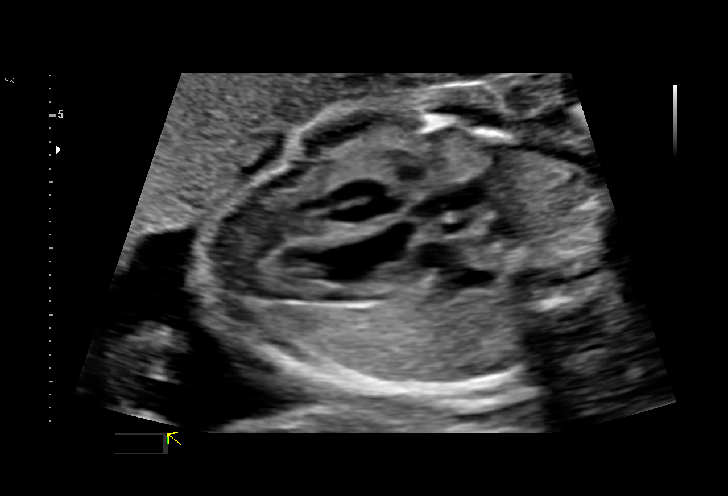
[im 17/66]
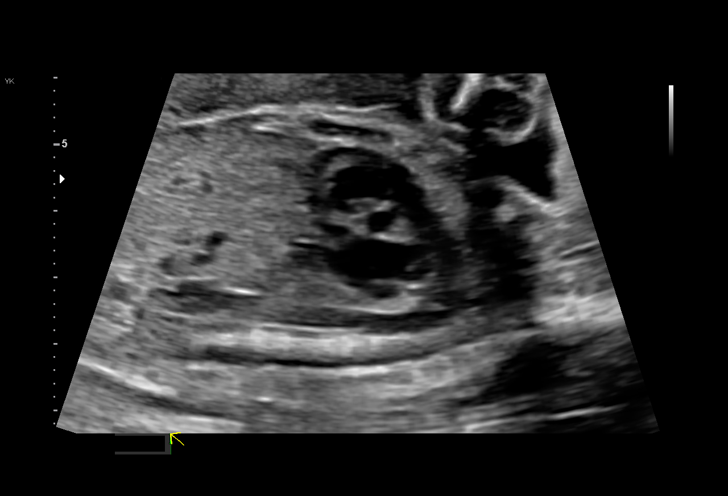
[im 22/66]
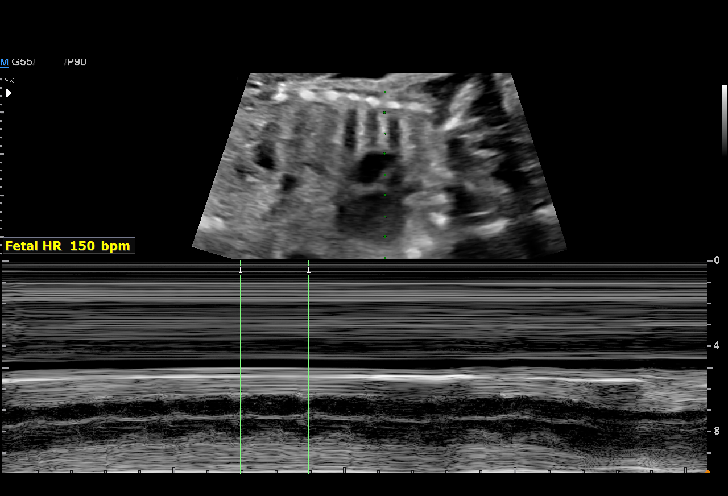
[im 27/66]
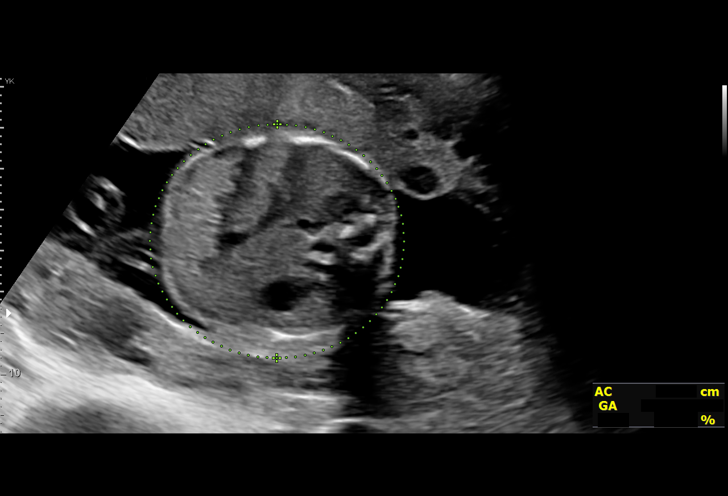
[im 34/66]
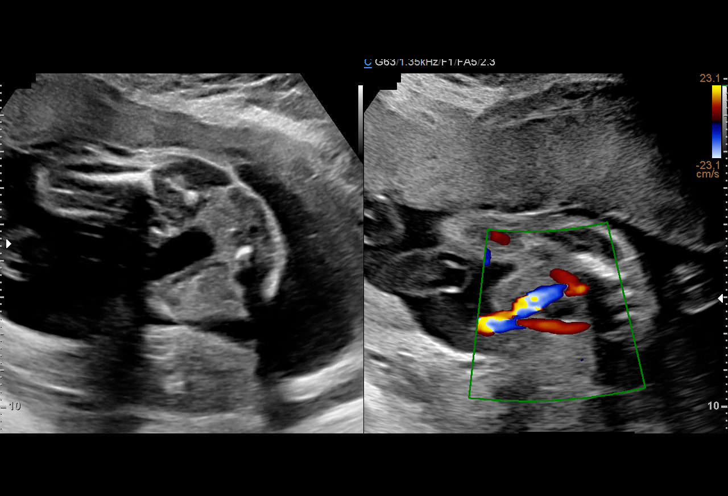
[im 39/66]
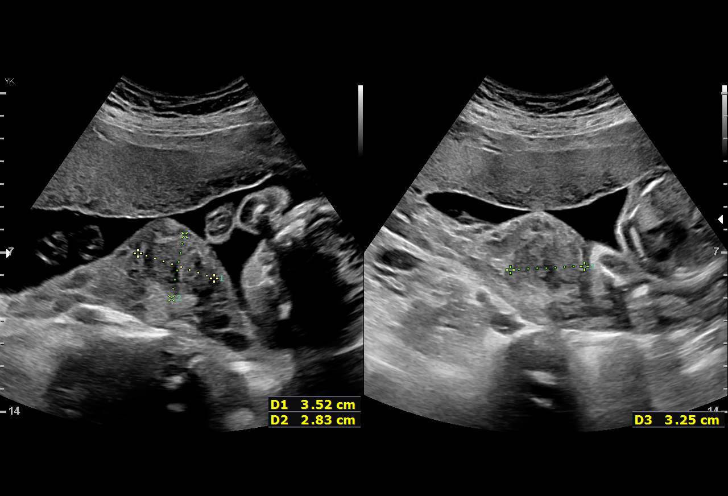
[im 44/66]
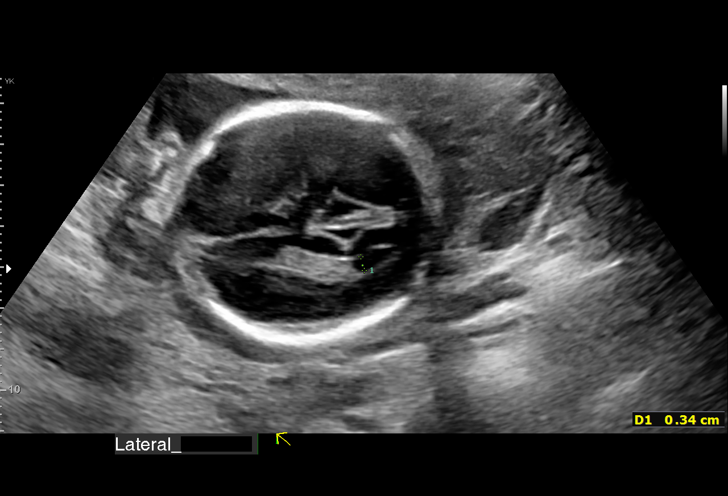
[im 49/66]
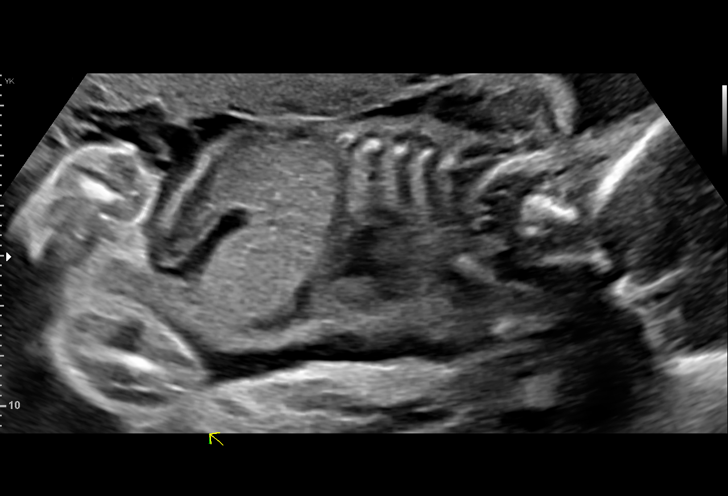
[im 53/66]
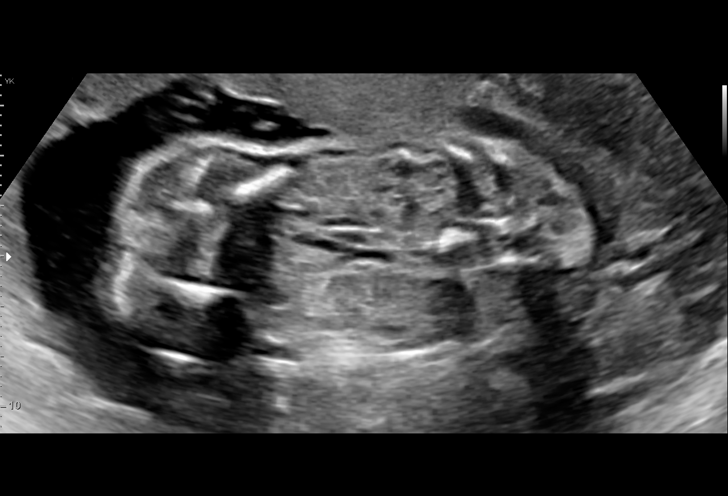
[im 58/66]
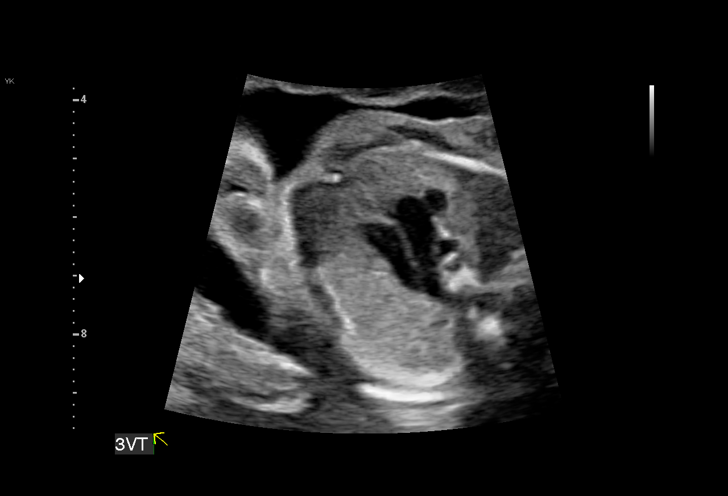
[im 63/66]
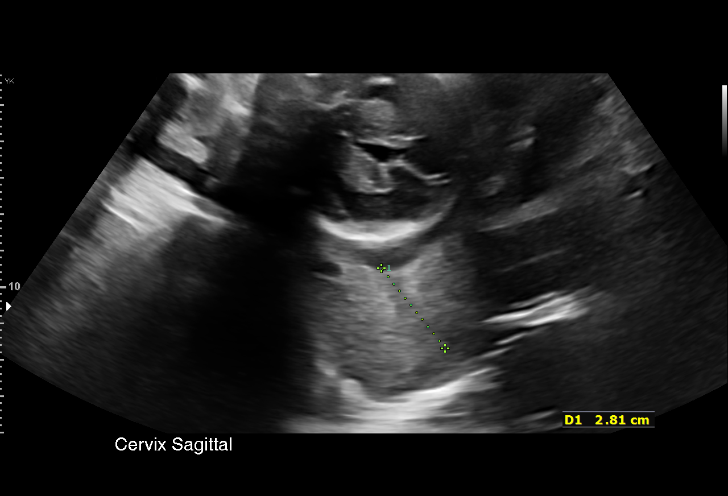

[13 of 28 positions shown; findings below may reference images not displayed]

Indications

 Advanced maternal age multigravida 35+,
 first trimester (34 y.o.)
 History of cesarean delivery, currently
 pregnant
 Poor obstetrical history (hx of myomectomy)
 Poor obstetrical history (Hx of infertility)
 Genetic carrier (Increased risk SMA)
 Genetic carrier (Carrier for CF)
 Hyperthyroid
 Encounter for antenatal screening for
 malformations
 LR NIPS, Neg AFP
 Poor obstetric history: Previous pregnancy
 with short cervix
 23 weeks gestation of pregnancy
Fetal Evaluation

 Num Of Fetuses:         1
 Fetal Heart Rate(bpm):  149
 Cardiac Activity:       Observed
 Presentation:           Cephalic
 Placenta:               Anterior
 P. Cord Insertion:      Previously Visualized
 Amniotic Fluid
 AFI FV:      Within normal limits

                             Largest Pocket(cm)

Biometry

 BPD:      57.3  mm     G. Age:  23w 4d         54  %    CI:        71.95   %    70 - 86
                                                         FL/HC:      18.7   %    19.2 -
 HC:       215   mm     G. Age:  23w 4d         45  %    HC/AC:      1.16        1.05 -
 AC:      185.4  mm     G. Age:  23w 2d         43  %    FL/BPD:     70.3   %    71 - 87
 FL:       40.3  mm     G. Age:  23w 0d         30  %    FL/AC:      21.7   %    20 - 24

 Est. FW:     577  gm      1 lb 4 oz     40  %
OB History

 Gravidity:    2         Term:   1
 Living:       1
Gestational Age

 LMP:           22w 2d        Date:  04/29/20                 EDD:   02/03/21
 U/S Today:     23w 3d                                        EDD:   01/26/21
 Best:          23w 2d     Det. By:  U/S C R L  (07/04/20)    EDD:   01/27/21
Anatomy

 Cranium:               Appears normal         Aortic Arch:            Appears normal
 Cavum:                 Appears normal         Ductal Arch:            Appears normal
 Ventricles:            Appears normal         Diaphragm:              Appears normal
 Choroid Plexus:        Appears normal         Stomach:                Appears normal, left
                                                                       sided
 Cerebellum:            Appears normal         Abdomen:                Appears normal
 Posterior Fossa:       Appears normal         Abdominal Wall:         Appears nml (cord
                                                                       insert, abd wall)
 Nuchal Fold:           Appears normal         Cord Vessels:           Appears normal (3
                                                                       vessel cord)
 Face:                  Orbits nl pre; profile Kidneys:                Appear normal
                        not well visualized
 Lips:                  Previously seen        Bladder:                Appears normal
 Thoracic:              Appears normal         Spine:                  Previously seen
 Heart:                 Appears normal         Upper Extremities:      Previously seen
                        (4CH, axis, and
                        situs)
 RVOT:                  Appears normal         Lower Extremities:      Previously seen
 LVOT:                  Appears normal

 Other:  Fetus appears to be a male. Heels and 5th digit visualized. 3VV and
         3VTV  well visualized.
Cervix Uterus Adnexa

 Cervix
 Length:              3  cm.
 Normal appearance by transabdominal scan.
 Uterus
 No abnormality visualized.
Impression

 Patient returned for completion of fetal anatomy .Amniotic
 fluid is normal and good fetal activity is seen .Fetal biometry
 is consistent with her previously-established dates .Fetal
 anatomical survey was completed and appears normal. Fetal
 heart rate and rhythm are normal.
 Patient was advised by her endocrinologist to stop antithyroid
 medications (PTU).
 Her most recent TSH levels are within normal range.
Recommendations

 -An appointment was made for her to return in 8 weeks for
 fetal growth assessment.
                 Orlandini, Parohija

## 2023-02-21 ENCOUNTER — Encounter (INDEPENDENT_AMBULATORY_CARE_PROVIDER_SITE_OTHER): Payer: Self-pay | Admitting: Internal Medicine

## 2023-02-21 ENCOUNTER — Ambulatory Visit (INDEPENDENT_AMBULATORY_CARE_PROVIDER_SITE_OTHER): Payer: BC Managed Care – PPO | Admitting: Internal Medicine

## 2023-02-21 VITALS — BP 106/68 | HR 65 | Temp 98.1°F | Ht 66.0 in | Wt 178.0 lb

## 2023-02-21 DIAGNOSIS — R7303 Prediabetes: Secondary | ICD-10-CM

## 2023-02-21 DIAGNOSIS — Z6828 Body mass index (BMI) 28.0-28.9, adult: Secondary | ICD-10-CM

## 2023-02-21 DIAGNOSIS — E7800 Pure hypercholesterolemia, unspecified: Secondary | ICD-10-CM

## 2023-02-21 DIAGNOSIS — E669 Obesity, unspecified: Secondary | ICD-10-CM | POA: Diagnosis not present

## 2023-02-21 DIAGNOSIS — E66811 Obesity, class 1: Secondary | ICD-10-CM

## 2023-02-21 MED ORDER — METFORMIN HCL ER 500 MG PO TB24
500.0000 mg | ORAL_TABLET | Freq: Every day | ORAL | 0 refills | Status: DC
Start: 2023-02-21 — End: 2023-05-09

## 2023-02-21 NOTE — Progress Notes (Signed)
Office: [phone]  /  Fax: [phone]  Weight Summary And Biometrics  Vitals Temp: 98.1 F (36.7 C) BP: 106/68 Pulse Rate: 65 SpO2: 100 %   Anthropometric Measurements Height: 5\' 6"  (1.676 m) Weight: 178 lb (80.7 kg) BMI (Calculated): 28.74 Weight at Last Visit: 174 lb Weight Lost Since Last Visit: 0 lb Weight Gained Since Last Visit: 4 lb Starting Weight: 186 lb Total Weight Loss (lbs): 8 lb (3.629 kg) Peak Weight: 194 lb   Body Composition  Body Fat %: 37.8 % Fat Mass (lbs): 67.4 lbs Muscle Mass (lbs): 105.2 lbs Total Body Water (lbs): 75.4 lbs Visceral Fat Rating : 7    No data recorded Today's Visit #: 10  Starting Date: 06/15/22   Subjective   Chief Complaint: Obesity  [name] is here to discuss her progress with her obesity treatment plan. She is on her own plan and states she is following her eating plan approximately 60 % of the time. She states she is exercising 30 minutes 2-3 times per week.  Interval History:   Discussed the use of AI scribe software for clinical note transcription with the patient, who gave verbal consent to proceed.  History of Present Illness   The patient, affected by obesity, prediabetes, and hypercholesterolemia, presents for a weight management consultation. She is currently on metformin XR-100 for pharmacoprophylaxis. Despite efforts to control portions and make healthier dietary choices, the patient reports adherence to her plan about 60% of the time. She engages in exercise for 30 minutes two to three times per week. However, since the last office visit, she has gained four pounds.  The patient acknowledges a recent increase in carbohydrate intake and a decrease in protein consumption. She also reports a decrease in physical activity due to personal commitments. The patient expresses concern about potential weight gain during the upcoming holiday season, although she anticipates fewer large celebrations this year due to  family circumstances.  The patient has been off metformin since the last visit, following a decrease in her A1c levels. However, she expresses concern about potential weight gain and an increase in A1c levels without the medication. She is considering resuming metformin use for its potential benefits in weight management and blood sugar control.  The patient's cholesterol levels have been a concern in the past, with an LDL of 153 in March of the current year. However, a more recent test in June showed an improvement with an LDL of 85. The patient is keen to maintain these improved levels and is aware of the impact of diet on cholesterol levels. She reports a preference for chicken and fish over red meats and consumes reduced-fat dairy products.  In summary, the patient is actively engaged in managing her weight and associated health conditions but is struggling with recent weight gain. She is considering changes to her diet and medication regimen to better manage her weight and overall health.         Orexigenic Control:  Denies problems with appetite and hunger signals.  Denies problems with satiety and satiation.  Denies problems with eating patterns and portion control.  Denies abnormal cravings. Denies feeling deprived or restricted.   Barriers identified: none.   Pharmacotherapy for weight loss: She is currently taking Metformin (off label use for incretin effect and / or insulin resistance and / or diabetes prevention) with adequate clinical response  and without side effects..   Assessment and Plan   Treatment Plan For Obesity:  Recommended Dietary Goals  [name]  is currently in the action stage of change. As such, her goal is to continue weight management plan. She has agreed to: continue current plan  Behavioral Intervention  We discussed the following Behavioral Modification Strategies today: continue to work on maintaining a reduced calorie state, getting the recommended  amount of protein, incorporating whole foods, making healthy choices, staying well hydrated and practicing mindfulness when eating..  Additional resources provided today: Handout on traveling and holiday eating strategies  Recommended Physical Activity Goals  [name] has been advised to work up to 150 minutes of moderate intensity aerobic activity a week and strengthening exercises 2-3 times per week for cardiovascular health, weight loss maintenance and preservation of muscle mass.   She has agreed to :  Think about enjoyable ways to increase daily physical activity and overcoming barriers to exercise and Increase physical activity in their day and reduce sedentary time (increase NEAT).  Pharmacotherapy  We discussed various medication options to help [name] with her weight loss efforts and we both agreed to : continue current anti-obesity medication regimen  Associated Conditions Addressed Today  Assessment and Plan    Obesity   She presents for weight management, reporting a four-pound weight gain since the last visit despite following portion control and making healthier choices 60% of the time and exercising for 30 minutes two to three times per week. We discussed the importance of maintaining a balanced diet with a focus on protein intake, reducing simple carbohydrates, and increasing fruits and vegetables. We emphasized strategies for managing diet during holidays and the impact of increased carbohydrate intake on insulin levels and weight gain. We will encourage balanced meals with more protein and vegetables, reduce intake of simple carbohydrates, provide strategies for managing diet during holidays, and schedule a follow-up in four weeks.  Prediabetes   Her previous A1c was 5.6, and she stopped metformin XR-500 after her A1c improved. We discussed the benefits of metformin in reducing A1c, aiding in weight management, and providing a sense of fullness. We recommended resuming metformin to  help maintain A1c and support weight management, discussed the safety and long-term use of metformin in reducing diabetes risk. We will resume metformin XR-500, one tablet daily.  Hypercholesterolemia   Her LDL improved from 153 to 85 in June. We discussed reducing saturated fats and maintaining a healthy diet to manage cholesterol levels, emphasizing the role of genetics and weight gain in cholesterol levels. We encouraged the use of reduced-fat dairy products and continued dietary modifications. We will recheck cholesterol levels during the next labs, encourage reduced-fat dairy products, and continue current dietary modifications.  General Health Maintenance   We discussed the importance of a balanced diet, regular exercise, and monitoring blood pressure, providing educational materials on maintaining health during the holidays. We will encourage regular physical activity and provide educational materials on holiday health strategies.  Follow-up   A follow-up appointment is scheduled in four weeks.            Objective   Physical Exam:  Blood pressure 106/68, pulse 65, temperature 98.1 F (36.7 C), height 5\' 6"  (1.676 m), weight 178 lb (80.7 kg), last menstrual period 02/10/2023, SpO2 100%. Body mass index is 28.73 kg/m.  General: She is overweight, cooperative, alert, well developed, and in no acute distress. PSYCH: Has normal mood, affect and thought process.   HEENT: EOMI, sclerae are anicteric. Lungs: Normal breathing effort, no conversational dyspnea. Extremities: No edema.  Neurologic: No gross sensory or motor deficits. No tremors or fasciculations  noted.    Diagnostic Data Reviewed:  BMET    Component Value Date/Time   NA 138 06/08/2022 1526   K 3.9 06/08/2022 1526   CL 101 06/08/2022 1526   CO2 18 (L) 06/08/2022 1526   GLUCOSE 73 06/08/2022 1526   GLUCOSE 103 (H) 01/14/2017 0442   BUN 7 06/08/2022 1526   CREATININE 0.69 06/08/2022 1526   CALCIUM 9.3  06/08/2022 1526   GFRNONAA 114 04/07/2018 1433   GFRAA 132 04/07/2018 1433   Lab Results  Component Value Date   HGBA1C 5.6 01/31/2023   HGBA1C 5.4 07/09/2020   Lab Results  Component Value Date   INSULIN 6.6 06/08/2022   Lab Results  Component Value Date   TSH 0.572 05/03/2022   CBC    Component Value Date/Time   WBC 4.6 05/03/2022 1636   WBC 8.3 01/05/2021 1132   RBC 4.32 05/03/2022 1636   RBC 3.37 (L) 01/05/2021 1132   HGB 12.3 05/03/2022 1636   HGB 11.4 02/08/2013 0000   HCT 37.3 05/03/2022 1636   HCT 33 02/08/2013 0000   PLT 212 05/03/2022 1636   PLT 174 02/08/2013 0000   MCV 86 05/03/2022 1636   MCH 28.5 05/03/2022 1636   MCH 26.7 01/05/2021 1132   MCHC 33.0 05/03/2022 1636   MCHC 32.0 01/05/2021 1132   RDW 12.3 05/03/2022 1636   Iron Studies    Component Value Date/Time   IRON 63 04/21/2021 1147   TIBC 311 04/21/2021 1147   FERRITIN 40 04/21/2021 1147   IRONPCTSAT 20 04/21/2021 1147   Lipid Panel     Component Value Date/Time   CHOL 227 (H) 06/08/2022 1526   TRIG 94 06/08/2022 1526   HDL 57 06/08/2022 1526   CHOLHDL 2.8 05/03/2022 1636   LDLCALC 153 (H) 06/08/2022 1526   LDLDIRECT 85 09/14/2022 1649   Hepatic Function Panel     Component Value Date/Time   PROT 7.5 06/08/2022 1526   ALBUMIN 4.5 06/08/2022 1526   AST 25 06/08/2022 1526   ALT 26 06/08/2022 1526   ALKPHOS 57 06/08/2022 1526   BILITOT 0.4 06/08/2022 1526      Component Value Date/Time   TSH 0.572 05/03/2022 1636   Nutritional Lab Results  Component Value Date   VD25OH 42.2 09/14/2022   VD25OH 17.8 (L) 05/03/2022   VD25OH 12.9 (L) 04/21/2021    Follow-Up   No follow-ups on file.Marland Kitchen She was informed of the importance of frequent follow up visits to maximize her success with intensive lifestyle modifications for her multiple health conditions.  Attestation Statement   Reviewed by clinician on day of visit: allergies, medications, problem list, medical history, surgical  history, family history, social history, and previous encounter notes.     Worthy Rancher, MD

## 2023-03-21 ENCOUNTER — Ambulatory Visit (INDEPENDENT_AMBULATORY_CARE_PROVIDER_SITE_OTHER): Payer: BC Managed Care – PPO | Admitting: Internal Medicine

## 2023-03-21 ENCOUNTER — Encounter (INDEPENDENT_AMBULATORY_CARE_PROVIDER_SITE_OTHER): Payer: Self-pay | Admitting: Internal Medicine

## 2023-03-21 VITALS — BP 112/77 | HR 74 | Temp 98.2°F | Ht 66.0 in | Wt 177.0 lb

## 2023-03-21 DIAGNOSIS — Z6828 Body mass index (BMI) 28.0-28.9, adult: Secondary | ICD-10-CM | POA: Diagnosis not present

## 2023-03-21 DIAGNOSIS — R7303 Prediabetes: Secondary | ICD-10-CM

## 2023-03-21 DIAGNOSIS — E66811 Obesity, class 1: Secondary | ICD-10-CM | POA: Diagnosis not present

## 2023-03-21 MED ORDER — PHENTERMINE HCL 37.5 MG PO TABS: 18.7500 mg | ORAL_TABLET | Freq: Every day | ORAL | 0 refills | Status: DC

## 2023-03-21 NOTE — Assessment & Plan Note (Signed)
Improving.  Most recent A1c was 5.6.  Continue metformin 500 mg XR for pharmacoprophylaxis.

## 2023-03-21 NOTE — Assessment & Plan Note (Signed)
See obesity treatment plan.  Patient will be started on phentermine 18.75 mg once daily.  We reviewed mechanisms of action, benefits and side effects.  Controlled substance agreement was signed we also reviewed state registry.

## 2023-03-21 NOTE — Progress Notes (Signed)
Office: [phone]  /  Fax: [phone]  Weight Summary And Biometrics  Vitals Temp: 98.2 F (36.8 C) BP: 112/77 Pulse Rate: 74 SpO2: 100 %   Anthropometric Measurements Height: 5\' 6"  (1.676 m) Weight: 177 lb (80.3 kg) BMI (Calculated): 28.58 Weight at Last Visit: 178 lb Weight Lost Since Last Visit: 1 lb Weight Gained Since Last Visit: 0 lb Starting Weight: 186 lb Total Weight Loss (lbs): 9 lb (4.082 kg) Peak Weight: 194 lb   Body Composition  Body Fat %: 37.2 % Fat Mass (lbs): 65.8 lbs Muscle Mass (lbs): 105.6 lbs Total Body Water (lbs): 74.2 lbs Visceral Fat Rating : 7    No data recorded Today's Visit #: 11  Starting Date: 06/15/22   Subjective   Chief Complaint: Obesity  [name] is here to discuss her progress with her obesity treatment plan. She is on the keeping a food journal and adhering to recommended goals of 1200 calories and 90 protein and states she is following her eating plan approximately 60 % of the time. She states she is exercising 30 minutes 2 times per week.  Interval History:   Since last office visit she has lost 1 pounds. She reports fair adherence to reduced calorie nutritional plan. She has been working on reading food labels, not skipping meals, increasing protein intake at every meal, drinking more water, making healthier choices, reducing portion sizes, and incorporating more whole foods   Orexigenic Control:  Reports problems with appetite and hunger signals.  Reports problems with satiety and satiation.  Denies problems with eating patterns and portion control.  Denies abnormal cravings. Denies feeling deprived or restricted.   Barriers identified: none.   Pharmacotherapy for weight loss: She is currently taking Metformin (off label use for incretin effect and / or insulin resistance and / or diabetes prevention) with adequate clinical response  and without side effects..   Assessment and Plan   Treatment Plan For  Obesity:  Recommended Dietary Goals  [name] is currently in the action stage of change. As such, her goal is to continue weight management plan. She has agreed to: continue current plan  Behavioral Intervention  We discussed the following Behavioral Modification Strategies today: continue to work on maintaining a reduced calorie state, getting the recommended amount of protein, incorporating whole foods, making healthy choices, staying well hydrated and practicing mindfulness when eating..  Additional resources provided today: None  Recommended Physical Activity Goals  [name] has been advised to work up to 150 minutes of moderate intensity aerobic activity a week and strengthening exercises 2-3 times per week for cardiovascular health, weight loss maintenance and preservation of muscle mass.   She has agreed to :  Think about enjoyable ways to increase daily physical activity and overcoming barriers to exercise and Increase physical activity in their day and reduce sedentary time (increase NEAT).  Pharmacotherapy  We discussed various medication options to help Gala with her weight loss efforts and we both agreed to : start anti-obesity medication.  In addition to reduced calorie nutrition plan (RCNP), behavioral strategies and physical activity, [name] would benefit from pharmacotherapy to assist with hunger signals, satiety and cravings. This will reduce obesity-related health risks by inducing weight loss, and help reduce food consumption and adherence to [facility]) . It may also improve QOL by improving self-confidence and reduce the  setbacks associated with metabolic adaptations.  After discussion of treatment options, mechanisms of action, benefits, side effects, contraindications and shared decision making she is agreeable to starting  phentermine 18.75 mg once daily. Patient also made aware that medication is indicated for long-term management of obesity and the risk of weight regain following  discontinuation of treatment and hence the importance of adhering to medical weight loss plan.  Patient signed controlled substance agreement.  We also checked database for controlled substances.  Associated Conditions Addressed Today  Class 1 obesity with serious comorbidity and body mass index (BMI) of 31.0 to 31.9 in adult, unspecified obesity type Assessment & Plan: See obesity treatment plan.  Patient will be started on phentermine 18.75 mg once daily.  We reviewed mechanisms of action, benefits and side effects.  Controlled substance agreement was signed we also reviewed state registry.  Orders: -     Phentermine HCl; Take 0.5 tablets (18.75 mg total) by mouth daily before breakfast.  Dispense: 15 tablet; Refill: 0  Prediabetes Assessment & Plan: Improving.  Most recent A1c was 5.6.  Continue metformin 500 mg XR for pharmacoprophylaxis.      Objective   Physical Exam:  Blood pressure 112/77, pulse 74, temperature 98.2 F (36.8 C), height 5\' 6"  (1.676 m), weight 177 lb (80.3 kg), last menstrual period 02/10/2023, SpO2 100%. Body mass index is 28.57 kg/m.  General: She is overweight, cooperative, alert, well developed, and in no acute distress. PSYCH: Has normal mood, affect and thought process.   HEENT: EOMI, sclerae are anicteric. Lungs: Normal breathing effort, no conversational dyspnea. Extremities: No edema.  Neurologic: No gross sensory or motor deficits. No tremors or fasciculations noted.    Diagnostic Data Reviewed:  BMET    Component Value Date/Time   NA 138 06/08/2022 1526   K 3.9 06/08/2022 1526   CL 101 06/08/2022 1526   CO2 18 (L) 06/08/2022 1526   GLUCOSE 73 06/08/2022 1526   GLUCOSE 103 (H) 01/14/2017 0442   BUN 7 06/08/2022 1526   CREATININE 0.69 06/08/2022 1526   CALCIUM 9.3 06/08/2022 1526   GFRNONAA 114 04/07/2018 1433   GFRAA 132 04/07/2018 1433   Lab Results  Component Value Date   HGBA1C 5.6 01/31/2023   HGBA1C 5.4 07/09/2020    Lab Results  Component Value Date   INSULIN 6.6 06/08/2022   Lab Results  Component Value Date   TSH 0.572 05/03/2022   CBC    Component Value Date/Time   WBC 4.6 05/03/2022 1636   WBC 8.3 01/05/2021 1132   RBC 4.32 05/03/2022 1636   RBC 3.37 (L) 01/05/2021 1132   HGB 12.3 05/03/2022 1636   HGB 11.4 02/08/2013 0000   HCT 37.3 05/03/2022 1636   HCT 33 02/08/2013 0000   PLT 212 05/03/2022 1636   PLT 174 02/08/2013 0000   MCV 86 05/03/2022 1636   MCH 28.5 05/03/2022 1636   MCH 26.7 01/05/2021 1132   MCHC 33.0 05/03/2022 1636   MCHC 32.0 01/05/2021 1132   RDW 12.3 05/03/2022 1636   Iron Studies    Component Value Date/Time   IRON 63 04/21/2021 1147   TIBC 311 04/21/2021 1147   FERRITIN 40 04/21/2021 1147   IRONPCTSAT 20 04/21/2021 1147   Lipid Panel     Component Value Date/Time   CHOL 227 (H) 06/08/2022 1526   TRIG 94 06/08/2022 1526   HDL 57 06/08/2022 1526   CHOLHDL 2.8 05/03/2022 1636   LDLCALC 153 (H) 06/08/2022 1526   LDLDIRECT 85 09/14/2022 1649   Hepatic Function Panel     Component Value Date/Time   PROT 7.5 06/08/2022 1526   ALBUMIN 4.5 06/08/2022 1526  AST 25 06/08/2022 1526   ALT 26 06/08/2022 1526   ALKPHOS 57 06/08/2022 1526   BILITOT 0.4 06/08/2022 1526      Component Value Date/Time   TSH 0.572 05/03/2022 1636   Nutritional Lab Results  Component Value Date   VD25OH 42.2 09/14/2022   VD25OH 17.8 (L) 05/03/2022   VD25OH 12.9 (L) 04/21/2021    Follow-Up   Return in about 4 weeks (around 04/18/2023) for For Weight Mangement with Dr. Rikki Spearing.Marland Kitchen She was informed of the importance of frequent follow up visits to maximize her success with intensive lifestyle modifications for her multiple health conditions.  Attestation Statement   Reviewed by clinician on day of visit: allergies, medications, problem list, medical history, surgical history, family history, social history, and previous encounter notes.     Worthy Rancher, MD

## 2023-03-22 ENCOUNTER — Encounter (INDEPENDENT_AMBULATORY_CARE_PROVIDER_SITE_OTHER): Payer: Self-pay

## 2023-03-22 ENCOUNTER — Telehealth (INDEPENDENT_AMBULATORY_CARE_PROVIDER_SITE_OTHER): Payer: Self-pay

## 2023-03-22 NOTE — Telephone Encounter (Signed)
Status:Approved;Review Type:Prior Auth;Coverage Start Date:02/20/2023;Coverage End Date:06/20/2023;

## 2023-03-22 NOTE — Telephone Encounter (Signed)
Prior auth started Key: [id number]

## 2023-05-09 ENCOUNTER — Encounter (INDEPENDENT_AMBULATORY_CARE_PROVIDER_SITE_OTHER): Payer: Self-pay | Admitting: Internal Medicine

## 2023-05-09 ENCOUNTER — Ambulatory Visit (INDEPENDENT_AMBULATORY_CARE_PROVIDER_SITE_OTHER): Payer: BC Managed Care – PPO | Admitting: Internal Medicine

## 2023-05-09 VITALS — BP 116/69 | HR 75 | Temp 98.0°F | Ht 66.0 in | Wt 166.0 lb

## 2023-05-09 DIAGNOSIS — E7800 Pure hypercholesterolemia, unspecified: Secondary | ICD-10-CM | POA: Diagnosis not present

## 2023-05-09 DIAGNOSIS — R7303 Prediabetes: Secondary | ICD-10-CM

## 2023-05-09 DIAGNOSIS — E66811 Obesity, class 1: Secondary | ICD-10-CM | POA: Diagnosis not present

## 2023-05-09 DIAGNOSIS — Z6831 Body mass index (BMI) 31.0-31.9, adult: Secondary | ICD-10-CM

## 2023-05-09 MED ORDER — PHENTERMINE HCL 37.5 MG PO TABS
18.7500 mg | ORAL_TABLET | Freq: Every day | ORAL | 0 refills | Status: DC
Start: 2023-05-09 — End: 2023-06-06

## 2023-05-09 MED ORDER — METFORMIN HCL ER 500 MG PO TB24
500.0000 mg | ORAL_TABLET | Freq: Every day | ORAL | 0 refills | Status: DC
Start: 2023-05-09 — End: 2023-07-13

## 2023-05-09 NOTE — Assessment & Plan Note (Addendum)
Her A1c from October is 5.6 and improved from 5.7.  She is currently on metformin for pharmacoprophylaxis along with medically supervised weight management plan.  Continue current plan inclusive of metformin.  She has lost 15% of total body weight so she should be able to maintain a euglycemic state.

## 2023-05-09 NOTE — Assessment & Plan Note (Signed)
Check fasting lipid profile when she has a wellness physical.  She has been advised to maintain saturated fats to less than 10% of calories.  She has lost 15% of total body weight so anticipate some improvement.

## 2023-05-09 NOTE — Progress Notes (Signed)
Office: [phone]  /  Fax: [phone]  Weight Summary And Biometrics  Vitals Temp: 98 F (36.7 C) BP: 116/69 Pulse Rate: 75 SpO2: 100 %   Anthropometric Measurements Height: 5\' 6"  (1.676 m) Weight: 166 lb (75.3 kg) BMI (Calculated): 26.81 Weight at Last Visit: 177 lb Weight Lost Since Last Visit: 11 lb Weight Gained Since Last Visit: 0 lb Starting Weight: 186 lb Total Weight Loss (lbs): 20 lb (9.072 kg) Peak Weight: 194 lb   Body Composition  Body Fat %: 35.2 % Fat Mass (lbs): 58.8 lbs Muscle Mass (lbs): 102.6 lbs Total Body Water (lbs): 71.2 lbs Visceral Fat Rating : 6    No data recorded Today's Visit #: 12  Starting Date: 06/15/22   Subjective   Chief Complaint: Obesity  [name] is here to discuss her progress with her obesity treatment plan. She is on the her own plan of 1200 calories and 90 grams of protein daily and states she is following her eating plan approximately 70 % of the time. She states she is exercising 30 minutes 2 times per week.  Weight Progress Since Last Visit:  Since last office visit she has lost 11 pounds. She reports good adherence to reduced calorie nutritional plan. She has been working on reading food labels, not skipping meals, increasing protein intake at every meal, drinking more water, making healthier choices, reducing portion sizes, and incorporating more whole foods   Challenges affecting patient progress: strong hunger signals and/or impaired satiety / inhibitory control.   Orexigenic Control: Reports improved problems with appetite and hunger signals.  Reports improved problems with satiety and satiation.  Reports improved problems with eating patterns and portion control.  Reports improved abnormal cravings. Denies feeling deprived or restricted.   Pharmacotherapy for weight management: She is currently taking Metformin (off label use for incretin effect and / or insulin resistance and / or diabetes prevention)  with adequate clinical response  and without side effects. and Phentermine (longterm use, single agent)  with adequate clinical response  and without side effects..   Assessment and Plan   Treatment Plan For Obesity:  Recommended Dietary Goals  Elenie is currently in the action stage of change. As such, her goal is to continue weight management plan. She has agreed to: continue current plan  Behavioral Health and Counseling  We discussed the following behavioral modification strategies today: continue to work on maintaining a reduced calorie state, getting the recommended amount of protein, incorporating whole foods, making healthy choices, staying well hydrated and practicing mindfulness when eating..  Additional education and resources provided today: None  Recommended Physical Activity Goals  Latina has been advised to work up to 150 minutes of moderate intensity aerobic activity a week and strengthening exercises 2-3 times per week for cardiovascular health, weight loss maintenance and preservation of muscle mass.   She has agreed to :  Think about enjoyable ways to increase daily physical activity and overcoming barriers to exercise and Increase physical activity in their day and reduce sedentary time (increase NEAT).  Pharmacotherapy  We discussed various medication options to help Sianne with her weight loss efforts and we both agreed to : adequate clinical response to current dose, continue current regimen  Associated Conditions Impacted by Obesity Treatment  Elevated LDL cholesterol level Assessment & Plan: Check fasting lipid profile when she has a wellness physical.  She has been advised to maintain saturated fats to less than 10% of calories.  She has lost 15% of total body  weight so anticipate some improvement.   Prediabetes Assessment & Plan: Her A1c from October is 5.6 and improved from 5.7.  She is currently on metformin for pharmacoprophylaxis along with medically  supervised weight management plan.  Continue current plan inclusive of metformin.  She has lost 15% of total body weight so she should be able to maintain a euglycemic state.  Orders: -     metFORMIN HCl ER; Take 1 tablet (500 mg total) by mouth daily with breakfast.  Dispense: 90 tablet; Refill: 0  Class 1 obesity with serious comorbidity and body mass index (BMI) of 31.0 to 31.9 in adult, unspecified obesity type Assessment & Plan: She has lost a total of 28 pounds on medically supervised weight management plan inclusive of metformin and phentermine this represents 15% of total body weight.  Despite improvements in body fat percentage.  Her weight loss goal is 160 pounds.  We reviewed today the elements necessary to be successful at maintaining weight.  She will continue with antiobesity medications.  She will continue with reduced calorie nutrition plan.  Orders: -     Phentermine HCl; Take 0.5 tablets (18.75 mg total) by mouth daily before breakfast.  Dispense: 15 tablet; Refill: 0     Objective   Physical Exam:  Blood pressure 116/69, pulse 75, temperature 98 F (36.7 C), height 5\' 6"  (1.676 m), weight 166 lb (75.3 kg), last menstrual period 04/24/2023, SpO2 100%. Body mass index is 26.79 kg/m.  General: She is overweight, cooperative, alert, well developed, and in no acute distress. PSYCH: Has normal mood, affect and thought process.   HEENT: EOMI, sclerae are anicteric. Lungs: Normal breathing effort, no conversational dyspnea. Extremities: No edema.  Neurologic: No gross sensory or motor deficits. No tremors or fasciculations noted.    Diagnostic Data Reviewed:  BMET    Component Value Date/Time   NA 138 06/08/2022 1526   K 3.9 06/08/2022 1526   CL 101 06/08/2022 1526   CO2 18 (L) 06/08/2022 1526   GLUCOSE 73 06/08/2022 1526   GLUCOSE 103 (H) 01/14/2017 0442   BUN 7 06/08/2022 1526   CREATININE 0.69 06/08/2022 1526   CALCIUM 9.3 06/08/2022 1526   GFRNONAA 114  04/07/2018 1433   GFRAA 132 04/07/2018 1433   Lab Results  Component Value Date   HGBA1C 5.6 01/31/2023   HGBA1C 5.4 07/09/2020   Lab Results  Component Value Date   INSULIN 6.6 06/08/2022   Lab Results  Component Value Date   TSH 0.572 05/03/2022   CBC    Component Value Date/Time   WBC 4.6 05/03/2022 1636   WBC 8.3 01/05/2021 1132   RBC 4.32 05/03/2022 1636   RBC 3.37 (L) 01/05/2021 1132   HGB 12.3 05/03/2022 1636   HGB 11.4 02/08/2013 0000   HCT 37.3 05/03/2022 1636   HCT 33 02/08/2013 0000   PLT 212 05/03/2022 1636   PLT 174 02/08/2013 0000   MCV 86 05/03/2022 1636   MCH 28.5 05/03/2022 1636   MCH 26.7 01/05/2021 1132   MCHC 33.0 05/03/2022 1636   MCHC 32.0 01/05/2021 1132   RDW 12.3 05/03/2022 1636   Iron Studies    Component Value Date/Time   IRON 63 04/21/2021 1147   TIBC 311 04/21/2021 1147   FERRITIN 40 04/21/2021 1147   IRONPCTSAT 20 04/21/2021 1147   Lipid Panel     Component Value Date/Time   CHOL 227 (H) 06/08/2022 1526   TRIG 94 06/08/2022 1526   HDL 57 06/08/2022  1526   CHOLHDL 2.8 05/03/2022 1636   LDLCALC 153 (H) 06/08/2022 1526   LDLDIRECT 85 09/14/2022 1649   Hepatic Function Panel     Component Value Date/Time   PROT 7.5 06/08/2022 1526   ALBUMIN 4.5 06/08/2022 1526   AST 25 06/08/2022 1526   ALT 26 06/08/2022 1526   ALKPHOS 57 06/08/2022 1526   BILITOT 0.4 06/08/2022 1526      Component Value Date/Time   TSH 0.572 05/03/2022 1636   Nutritional Lab Results  Component Value Date   VD25OH 42.2 09/14/2022   VD25OH 17.8 (L) 05/03/2022   VD25OH 12.9 (L) 04/21/2021    Follow-Up   Return in about 4 weeks (around 06/06/2023) for For Weight Mangement with Dr. Rikki Spearing.Marland Kitchen She was informed of the importance of frequent follow up visits to maximize her success with intensive lifestyle modifications for her multiple health conditions.  Attestation Statement   Reviewed by clinician on day of visit: allergies, medications, problem  list, medical history, surgical history, family history, social history, and previous encounter notes.     Worthy Rancher, MD

## 2023-05-09 NOTE — Assessment & Plan Note (Signed)
She has lost a total of 28 pounds on medically supervised weight management plan inclusive of metformin and phentermine this represents 15% of total body weight.  Despite improvements in body fat percentage.  Her weight loss goal is 160 pounds.  We reviewed today the elements necessary to be successful at maintaining weight.  She will continue with antiobesity medications.  She will continue with reduced calorie nutrition plan.

## 2023-05-23 ENCOUNTER — Encounter: Payer: Self-pay | Admitting: Student

## 2023-05-23 ENCOUNTER — Other Ambulatory Visit (HOSPITAL_COMMUNITY)
Admission: RE | Admit: 2023-05-23 | Discharge: 2023-05-23 | Disposition: A | Discharge status: Home / Self Care | Payer: BC Managed Care – PPO | Source: Ambulatory Visit | Attending: Family Medicine | Admitting: Family Medicine

## 2023-05-23 ENCOUNTER — Ambulatory Visit (INDEPENDENT_AMBULATORY_CARE_PROVIDER_SITE_OTHER): Payer: BC Managed Care – PPO | Admitting: Student

## 2023-05-23 VITALS — BP 114/70 | HR 87 | Ht 66.0 in | Wt 173.0 lb

## 2023-05-23 DIAGNOSIS — E0590 Thyrotoxicosis, unspecified without thyrotoxic crisis or storm: Secondary | ICD-10-CM

## 2023-05-23 DIAGNOSIS — Z113 Encounter for screening for infections with a predominantly sexual mode of transmission: Secondary | ICD-10-CM | POA: Diagnosis present

## 2023-05-23 DIAGNOSIS — Z6831 Body mass index (BMI) 31.0-31.9, adult: Secondary | ICD-10-CM | POA: Diagnosis not present

## 2023-05-23 DIAGNOSIS — E559 Vitamin D deficiency, unspecified: Secondary | ICD-10-CM | POA: Diagnosis not present

## 2023-05-23 DIAGNOSIS — D649 Anemia, unspecified: Secondary | ICD-10-CM | POA: Diagnosis not present

## 2023-05-23 DIAGNOSIS — E66811 Obesity, class 1: Secondary | ICD-10-CM | POA: Diagnosis not present

## 2023-05-23 DIAGNOSIS — O99280 Endocrine, nutritional and metabolic diseases complicating pregnancy, unspecified trimester: Secondary | ICD-10-CM

## 2023-05-23 LAB — POCT GLYCOSYLATED HEMOGLOBIN (HGB A1C): Hemoglobin A1C: 5.3 % (ref 4.0–5.6)

## 2023-05-23 NOTE — Assessment & Plan Note (Addendum)
Following with medical weight management - HgB A1c - Basic Metabolic Panel - Lipid Panel

## 2023-05-23 NOTE — Patient Instructions (Signed)
Today at your annual preventive visit we talked about the following measures:  I recommend 150 minutes of exercise per week-try 30 minutes 5 days per week We discussed reducing sugary beverages (like soda and juice) and increasing leafy greens and whole fruits.  We discussed avoiding tobacco and alcohol.  I recommend avoiding illicit substances.  Your blood pressure is  at goal I will let you know what your labs showed

## 2023-05-23 NOTE — Assessment & Plan Note (Signed)
-   TSH Rfx on Abnormal to Free T4

## 2023-05-23 NOTE — Assessment & Plan Note (Signed)
Taking supplementation - Vitamin D, 25-hydroxy
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
## 2023-05-24 LAB — VITAMIN D 25 HYDROXY (VIT D DEFICIENCY, FRACTURES): Vit D, 25-Hydroxy: 48.7 ng/mL (ref 30.0–100.0)

## 2023-05-24 LAB — HCV INTERPRETATION

## 2023-05-24 LAB — LIPID PANEL
Chol/HDL Ratio: 2.6 {ratio} (ref 0.0–4.4)
Cholesterol, Total: 171 mg/dL (ref 100–199)
HDL: 66 mg/dL (ref >39)
LDL Chol Calc (NIH): 97 mg/dL (ref 0–99)
Triglycerides: 34 mg/dL (ref 0–149)
VLDL Cholesterol Cal: 8 mg/dL (ref 5–40)

## 2023-05-24 LAB — BASIC METABOLIC PANEL
BUN/Creatinine Ratio: 14 (ref 9–23)
BUN: 11 mg/dL (ref 6–20)
CO2: 21 mmol/L (ref 20–29)
Calcium: 9.3 mg/dL (ref 8.7–10.2)
Chloride: 103 mmol/L (ref 96–106)
Creatinine, Ser: 0.77 mg/dL (ref 0.57–1.00)
Glucose: 86 mg/dL (ref 70–99)
Potassium: 4.4 mmol/L (ref 3.5–5.2)
Sodium: 139 mmol/L (ref 134–144)
eGFR: 101 mL/min/{1.73_m2} (ref >59)

## 2023-05-24 LAB — CERVICOVAGINAL ANCILLARY ONLY
Chlamydia: NEGATIVE
Comment: NEGATIVE
Comment: NORMAL
Neisseria Gonorrhea: NEGATIVE

## 2023-05-24 LAB — CBC
Hematocrit: 36 % (ref 34.0–46.6)
Hemoglobin: 11.8 g/dL (ref 11.1–15.9)
MCH: 29.1 pg (ref 26.6–33.0)
MCHC: 32.8 g/dL (ref 31.5–35.7)
MCV: 89 fL (ref 79–97)
Platelets: 216 10*3/uL (ref 150–450)
RBC: 4.05 x10E6/uL (ref 3.77–5.28)
RDW: 12.3 % (ref 11.7–15.4)
WBC: 4 10*3/uL (ref 3.4–10.8)

## 2023-05-24 LAB — HIV ANTIBODY (ROUTINE TESTING W REFLEX): HIV Screen 4th Generation wRfx: NONREACTIVE

## 2023-05-24 LAB — TSH RFX ON ABNORMAL TO FREE T4: TSH: 1.41 u[IU]/mL (ref 0.450–4.500)

## 2023-05-24 LAB — HCV AB W REFLEX TO QUANT PCR: HCV Ab: NONREACTIVE

## 2023-05-24 LAB — RPR: RPR Ser Ql: NONREACTIVE

## 2023-06-06 ENCOUNTER — Encounter (INDEPENDENT_AMBULATORY_CARE_PROVIDER_SITE_OTHER): Payer: Self-pay | Admitting: Internal Medicine

## 2023-06-06 ENCOUNTER — Ambulatory Visit (INDEPENDENT_AMBULATORY_CARE_PROVIDER_SITE_OTHER): Payer: BC Managed Care – PPO | Admitting: Internal Medicine

## 2023-06-06 VITALS — BP 113/78 | HR 61 | Temp 98.2°F | Ht 66.0 in | Wt 165.0 lb

## 2023-06-06 DIAGNOSIS — E7800 Pure hypercholesterolemia, unspecified: Secondary | ICD-10-CM | POA: Diagnosis not present

## 2023-06-06 DIAGNOSIS — R7303 Prediabetes: Secondary | ICD-10-CM

## 2023-06-06 DIAGNOSIS — Z6831 Body mass index (BMI) 31.0-31.9, adult: Secondary | ICD-10-CM

## 2023-06-06 DIAGNOSIS — E66811 Obesity, class 1: Secondary | ICD-10-CM | POA: Diagnosis not present

## 2023-06-06 MED ORDER — PHENTERMINE HCL 37.5 MG PO TABS: 18.7500 mg | ORAL_TABLET | Freq: Every day | ORAL | 0 refills | Status: DC

## 2023-06-06 NOTE — Progress Notes (Signed)
 Office: [phone]  /  Fax: [phone]  Weight Summary And Biometrics  Vitals Temp: 98.2 F (36.8 C) BP: 113/78 Pulse Rate: 61 SpO2: 97 %   Anthropometric Measurements Height: 5\' 6"  (1.676 m) Weight: 165 lb (74.8 kg) BMI (Calculated): 26.64 Weight at Last Visit: 166 Weight Lost Since Last Visit: 1 lb Weight Gained Since Last Visit: 0 lb Starting Weight: 186 Total Weight Loss (lbs): 21 lb (9.526 kg) Peak Weight: 194 lb   Body Composition  Body Fat %: 35.7 % Fat Mass (lbs): 59.2 lbs Muscle Mass (lbs): 101.2 lbs Total Body Water (lbs): 71.6 lbs Visceral Fat Rating : 6    No data recorded Today's Visit #: 13  Starting Date: 06/15/22   Subjective   Chief Complaint: Obesity  Interval History Discussed the use of AI scribe software for clinical note transcription with the patient, who gave verbal consent to proceed.  History of Present Illness   [name] is a 38 year old female who presents for medical weight management.  She has lost approximately 29 pounds over the past year, reducing her weight from 194 pounds to 165 pounds, with a goal to reach 160 pounds. She has lost one pound since her last office visit. She follows her previous nutritional plan, tracking calories, consuming more whole foods, and ensuring adequate protein intake. She maintains hydration and does not skip meals, consuming approximately 1200 calories per day.  She exercises two days a week for about thirty minutes, primarily focusing on cardio activities such as using the treadmill. She has not engaged in strength training but is open to trying yoga. She reports getting adequate sleep and denies experiencing high levels of stress.  No palpitations or elevated blood pressure as side effects of Adipex, but she reports dry mouth. She feels the medication provides adequate appetite suppression and helps her feel full.  Her A1c has decreased from 5.7 to 5.3, and her LDL cholesterol  has improved from 153 to 97. She continues to take metformin. Her BMI is 26, and her body fat percentage is 35%. She aims to reduce her body fat percentage to 32%.       Challenges affecting patient progress: low volume of physical activity at present .    Pharmacotherapy for weight management: She is currently taking Metformin (off label use for incretin effect and / or insulin resistance and / or diabetes prevention) with adequate clinical response  and without side effects. and Phentermine (longterm use, single agent)  with adequate clinical response  and without side effects..   Assessment and Plan   Treatment Plan For Obesity:  Recommended Dietary Goals  [name] is currently in the action stage of change. As such, her goal is to continue weight management plan. She has agreed to: portion control, balanced plate and making smarter food choices, such as increasing vegetables, protein intake and reducing simple carbohydrates and processed foods  and keep a food journal with a target of  1200 calories per day and 90-120 grams of protein per day or 30-40 grams per meal.  Behavioral Health and Counseling  We discussed the following behavioral modification strategies today: continue to work on maintaining a reduced calorie state, getting the recommended amount of protein, incorporating whole foods, making healthy choices, staying well hydrated and practicing mindfulness when eating..  Additional education and resources provided today: None  Recommended Physical Activity Goals  [name] has been advised to work up to 150 minutes of moderate intensity aerobic activity a week  and strengthening exercises 2-3 times per week for cardiovascular health, weight loss maintenance and preservation of muscle mass.   She has agreed to :  Start strengthening exercises with a goal of 2-3 sessions a week   Pharmacotherapy  We discussed various medication options to help Trellis with her weight loss efforts and  we both agreed to : adequate clinical response to current dose, continue current regimen  Associated Conditions Impacted by Obesity Treatment  Elevated LDL cholesterol level Assessment & Plan: I reviewed most recent blood work her LDL has improved significantly with lifestyle changes.  Continue current weight management strategies no need for statin therapy.   Class 1 obesity with serious comorbidity and body mass index (BMI) of 31.0 to 31.9 in adult, unspecified obesity type Assessment & Plan: Patient presents for medical weight management. Since the last visit, she has lost one pound and is currently at 165 pounds with a goal of 160 pounds. She is following her nutritional plan, tracking calories (approximately 1200/day), eating whole foods, maintaining hydration, and exercising two days a week for 30 minutes (mostly cardio). BMI is 26, and body fat percentage is 35%, with a target of 32%. She is on Adipex (phentermine) and reports dry mouth as a side effect but no palpitations or difficulty sleeping. Discussed increasing physical activity, particularly strength training, to boost metabolism and maintain muscle mass. Options such as yoga, HIIT workouts, and dumbbell exercises were suggested. Intermittent fasting was also discussed as a potential strategy to boost metabolic rate while maintaining current calorie intake. Emphasized the need for adequate protein intake (30 grams per meal) to preserve muscle mass and support metabolism. Explained that reducing calories further could be highly restrictive and suggested increasing physical activity instead. Discussed the mathematical relationship between calorie deficit and weight loss, highlighting that a 500-calorie deficit results in a one-pound weight loss. - Continue Adipex (phentermine) - Increase physical activity, including strength training (yoga, HIIT, dumbbell exercises) - Consider intermittent fasting with a feeding window of 9 AM to 5 PM or 10  AM to 6 PM - Ensure adequate protein intake (30 grams per meal) - Follow up in one month  Orders: -     Phentermine HCl; Take 0.5 tablets (18.75 mg total) by mouth daily before breakfast.  Dispense: 15 tablet; Refill: 0  Prediabetes Assessment & Plan: Her hemoglobin A1c is now 5.3 down from 5.7 with current weight management strategy inclusive of metformin for pharmacoprophylaxis.  She will continue metformin and maintain a diet with a low glycemic load.            Objective   Physical Exam:  Blood pressure 113/78, pulse 61, temperature 98.2 F (36.8 C), height 5\' 6"  (1.676 m), weight 165 lb (74.8 kg), last menstrual period 05/19/2023, SpO2 97%. Body mass index is 26.63 kg/m.  General: She is overweight, cooperative, alert, well developed, and in no acute distress. PSYCH: Has normal mood, affect and thought process.   HEENT: EOMI, sclerae are anicteric. Lungs: Normal breathing effort, no conversational dyspnea. Extremities: No edema.  Neurologic: No gross sensory or motor deficits. No tremors or fasciculations noted.    Diagnostic Data Reviewed:  BMET    Component Value Date/Time   NA 139 05/23/2023 0916   K 4.4 05/23/2023 0916   CL 103 05/23/2023 0916   CO2 21 05/23/2023 0916   GLUCOSE 86 05/23/2023 0916   GLUCOSE 103 (H) 01/14/2017 0442   BUN 11 05/23/2023 0916   CREATININE 0.77 05/23/2023 0916   CALCIUM  9.3 05/23/2023 0916   GFRNONAA 114 04/07/2018 1433   GFRAA 132 04/07/2018 1433   Lab Results  Component Value Date   HGBA1C 5.3 05/23/2023   HGBA1C 5.4 07/09/2020   Lab Results  Component Value Date   INSULIN 6.6 06/08/2022   Lab Results  Component Value Date   TSH 1.410 05/23/2023   CBC    Component Value Date/Time   WBC 4.0 05/23/2023 0916   WBC 8.3 01/05/2021 1132   RBC 4.05 05/23/2023 0916   RBC 3.37 (L) 01/05/2021 1132   HGB 11.8 05/23/2023 0916   HGB 11.4 02/08/2013 0000   HCT 36.0 05/23/2023 0916   HCT 33 02/08/2013 0000   PLT 216  05/23/2023 0916   PLT 174 02/08/2013 0000   MCV 89 05/23/2023 0916   MCH 29.1 05/23/2023 0916   MCH 26.7 01/05/2021 1132   MCHC 32.8 05/23/2023 0916   MCHC 32.0 01/05/2021 1132   RDW 12.3 05/23/2023 0916   Iron Studies    Component Value Date/Time   IRON 63 04/21/2021 1147   TIBC 311 04/21/2021 1147   FERRITIN 40 04/21/2021 1147   IRONPCTSAT 20 04/21/2021 1147   Lipid Panel     Component Value Date/Time   CHOL 171 05/23/2023 0916   TRIG 34 05/23/2023 0916   HDL 66 05/23/2023 0916   CHOLHDL 2.6 05/23/2023 0916   LDLCALC 97 05/23/2023 0916   LDLDIRECT 85 09/14/2022 1649   Hepatic Function Panel     Component Value Date/Time   PROT 7.5 06/08/2022 1526   ALBUMIN 4.5 06/08/2022 1526   AST 25 06/08/2022 1526   ALT 26 06/08/2022 1526   ALKPHOS 57 06/08/2022 1526   BILITOT 0.4 06/08/2022 1526      Component Value Date/Time   TSH 1.410 05/23/2023 0916   TSH 0.572 05/03/2022 1636   Nutritional Lab Results  Component Value Date   VD25OH 48.7 05/23/2023   VD25OH 42.2 09/14/2022   VD25OH 17.8 (L) 05/03/2022    Medications: Outpatient Encounter Medications as of 06/06/2023  Medication Sig   Cholecalciferol (VITAMIN D3) 125 MCG (5000 UT) TABS Take by mouth daily at 12 noon.   ferrous sulfate 325 (65 FE) MG tablet Take 325 mg by mouth daily with breakfast.   metFORMIN (GLUCOPHAGE-XR) 500 MG 24 hr tablet Take 1 tablet (500 mg total) by mouth daily with breakfast.   naproxen (NAPROSYN) 500 MG tablet Take by mouth.   phentermine (ADIPEX-P) 37.5 MG tablet Take 0.5 tablets (18.75 mg total) by mouth daily before breakfast.   [DISCONTINUED] phentermine (ADIPEX-P) 37.5 MG tablet Take 0.5 tablets (18.75 mg total) by mouth daily before breakfast.   No facility-administered encounter medications on file as of 06/06/2023.     Follow-Up   Return in about 4 weeks (around 07/04/2023) for For Weight Mangement with Dr. Rikki Spearing.Marland Kitchen She was informed of the importance of frequent follow up  visits to maximize her success with intensive lifestyle modifications for her multiple health conditions.  Attestation Statement   Reviewed by clinician on day of visit: allergies, medications, problem list, medical history, surgical history, family history, social history, and previous encounter notes.     Worthy Rancher, MD

## 2023-06-06 NOTE — Assessment & Plan Note (Signed)
 Her hemoglobin A1c is now 5.3 down from 5.7 with current weight management strategy inclusive of metformin for pharmacoprophylaxis.  She will continue metformin and maintain a diet with a low glycemic load.

## 2023-06-06 NOTE — Assessment & Plan Note (Signed)
 I reviewed most recent blood work her LDL has improved significantly with lifestyle changes.  Continue current weight management strategies no need for statin therapy.

## 2023-06-06 NOTE — Assessment & Plan Note (Addendum)
 Patient presents for medical weight management. Since the last visit, she has lost one pound and is currently at 165 pounds with a goal of 160 pounds. She is following her nutritional plan, tracking calories (approximately 1200/day), eating whole foods, maintaining hydration, and exercising two days a week for 30 minutes (mostly cardio). BMI is 26, and body fat percentage is 35%, with a target of 32%. She is on Adipex (phentermine) and reports dry mouth as a side effect but no palpitations or difficulty sleeping. Discussed increasing physical activity, particularly strength training, to boost metabolism and maintain muscle mass. Options such as yoga, HIIT workouts, and dumbbell exercises were suggested. Intermittent fasting was also discussed as a potential strategy to boost metabolic rate while maintaining current calorie intake. Emphasized the need for adequate protein intake (30 grams per meal) to preserve muscle mass and support metabolism. Explained that reducing calories further could be highly restrictive and suggested increasing physical activity instead. Discussed the mathematical relationship between calorie deficit and weight loss, highlighting that a 500-calorie deficit results in a one-pound weight loss. - Continue Adipex (phentermine).  She has lost more than 5% in 6 months we will continue medication. - Increase physical activity, including strength training (yoga, HIIT, dumbbell exercises) - Consider intermittent fasting with a feeding window of 9 AM to 5 PM or 10 AM to 6 PM - Ensure adequate protein intake (30 grams per meal) - Follow up in one month

## 2023-07-11 ENCOUNTER — Ambulatory Visit (INDEPENDENT_AMBULATORY_CARE_PROVIDER_SITE_OTHER): Admitting: Internal Medicine

## 2023-07-13 ENCOUNTER — Ambulatory Visit (INDEPENDENT_AMBULATORY_CARE_PROVIDER_SITE_OTHER): Admitting: Internal Medicine

## 2023-07-13 ENCOUNTER — Encounter (INDEPENDENT_AMBULATORY_CARE_PROVIDER_SITE_OTHER): Payer: Self-pay | Admitting: Internal Medicine

## 2023-07-13 VITALS — BP 121/72 | HR 77 | Temp 98.0°F | Ht 66.0 in | Wt 166.0 lb

## 2023-07-13 DIAGNOSIS — Z6831 Body mass index (BMI) 31.0-31.9, adult: Secondary | ICD-10-CM

## 2023-07-13 DIAGNOSIS — R7303 Prediabetes: Secondary | ICD-10-CM | POA: Diagnosis not present

## 2023-07-13 DIAGNOSIS — E7800 Pure hypercholesterolemia, unspecified: Secondary | ICD-10-CM | POA: Diagnosis not present

## 2023-07-13 DIAGNOSIS — E66811 Obesity, class 1: Secondary | ICD-10-CM

## 2023-07-13 MED ORDER — METFORMIN HCL ER 500 MG PO TB24
500.0000 mg | ORAL_TABLET | Freq: Every day | ORAL | 0 refills | Status: AC
Start: 2023-07-13 — End: ?

## 2023-07-13 NOTE — Assessment & Plan Note (Signed)
 Her most recent A1c was 5.3 and down from 5.7 indicating improvement in glycemic control.  She will continue on metformin for pharmacoprophylaxis.  Check B12 levels with next hemoglobin A1c

## 2023-07-13 NOTE — Progress Notes (Signed)
 Office: [phone]  /  Fax: [phone]  Weight Summary And Biometrics  Vitals Temp: 98 F (36.7 C) BP: 121/72 Pulse Rate: 77 SpO2: 100 %   Anthropometric Measurements Height: 5\' 6"  (1.676 m) Weight: 166 lb (75.3 kg) BMI (Calculated): 26.81 Weight at Last Visit: 165 lb Weight Lost Since Last Visit: 0 lb Weight Gained Since Last Visit: 1 lb Starting Weight: 186 lb Total Weight Loss (lbs): 20 lb (9.072 kg) Peak Weight: 194 lb   Body Composition  Body Fat %: 35.4 % Fat Mass (lbs): 58.8 lbs Muscle Mass (lbs): 101.8 lbs Total Body Water (lbs): 72 lbs Visceral Fat Rating : 6    No data recorded Today's Visit #: 14  Starting Date: 06/15/22   Subjective   Chief Complaint: Obesity  Interval History Discussed the use of AI scribe software for clinical note transcription with the patient, who gave verbal consent to proceed.  History of Present Illness [name] is a 38 year old female who presents for medical weight management.  She has gained one pound since her last visit. She practices portion control, tracks her calories, eats more whole foods, gets the recommended amount of protein, maintains adequate hydration, and does not skip meals. She consumes about 1200 calories per day and aims for 90 grams of protein daily, distributed as 30 grams per meal. No hunger is reported, and she feels satisfied with her meals.   She is currently on phentermine and metformin for anti-obesity management. She wants to continue metformin to maintain her A1c levels, which have improved from 5.7 to 5.3.  In terms of physical activity, she exercises two days a week for forty-five minutes, doing cardio and yoga. She notes a slight change in her routine, now exercising for forty-five minutes instead of thirty, twice a week. She also mentions being active at home, sometimes achieving over 8000 steps a day. She reports getting adequate sleep and denies high levels of  stress.  Her past medical history includes prediabetes and high cholesterol, both of which have shown improvement. Her LDL cholesterol has decreased from 153 to 96 over the past year. She attributes some of her weight loss and health improvements to increased physical activity and dietary changes.    Challenges affecting patient progress: low volume of physical activity at present .    Pharmacotherapy for weight management: She is currently taking Metformin (off label use for incretin effect and / or insulin resistance and / or diabetes prevention) with adequate clinical response  and without side effects. and Phentermine (longterm use, single agent)  with adequate clinical response  and without side effects..   Assessment and Plan   Treatment Plan For Obesity:  Recommended Dietary Goals  [name] is currently in the action stage of change. As such, her goal is to continue weight management plan. She has agreed to: continue current plan  Behavioral Health and Counseling  We discussed the following behavioral modification strategies today: increasing lean protein intake to established goals, increasing vegetables, increasing water intake , and work on tracking and journaling calories using tracking application.  Additional education and resources provided today: None  Recommended Physical Activity Goals  [name] has been advised to work up to 150 minutes of moderate intensity aerobic activity a week and strengthening exercises 2-3 times per week for cardiovascular health, weight loss maintenance and preservation of muscle mass.   She has agreed to :  continue to gradually increase the amount and intensity of exercise routine  Pharmacotherapy  We discussed various medication options to help [name] with her weight loss efforts and we both agreed to :  Discontinue phentermine at patient's request as she will like to be able to maintain her weight loss with nutritional and behavioral strategies.   She would like to remain on metformin for pharmacoprophylaxis  Associated Conditions Impacted by Obesity Treatment  Elevated LDL cholesterol level Assessment & Plan: LDL cholesterol decreased from 153 mg/dL to 96 mg/dL, showing significant improvement, likely due to dietary changes and increased physical activity. - Continue current dietary and exercise regimen to maintain cholesterol levels.   Prediabetes Assessment & Plan: Her most recent A1c was 5.3 and down from 5.7 indicating improvement in glycemic control.  She will continue on metformin for pharmacoprophylaxis.  Check B12 levels with next hemoglobin A1c  Orders: -     metFORMIN HCl ER; Take 1 tablet (500 mg total) by mouth daily with breakfast.  Dispense: 90 tablet; Refill: 0  Class 1 obesity with serious comorbidity and body mass index (BMI) of 31.0 to 31.9 in adult, unspecified obesity type Assessment & Plan: [name] has done well she has lost 20 pounds or 12% of total body weight on medically supervised weight management plan inclusive of phentermine.  She would like to come off medication not because of side effect she would like to be able to manage her weight with nutrition and behavioral strategies.  She has decided to continue on metformin as she would like to prevent diabetes and also for weight management.  She will continue with current reduced calorie nutrition plan         Objective   Physical Exam:  Blood pressure 121/72, pulse 77, temperature 98 F (36.7 C), height 5\' 6"  (1.676 m), weight 166 lb (75.3 kg), last menstrual period 07/06/2023, SpO2 100%. Body mass index is 26.79 kg/m.  General: She is overweight, cooperative, alert, well developed, and in no acute distress. PSYCH: Has normal mood, affect and thought process.   HEENT: EOMI, sclerae are anicteric. Lungs: Normal breathing effort, no conversational dyspnea. Extremities: No edema.  Neurologic: No gross sensory or motor deficits. No tremors or  fasciculations noted.    Diagnostic Data Reviewed:  BMET    Component Value Date/Time   NA 139 05/23/2023 0916   K 4.4 05/23/2023 0916   CL 103 05/23/2023 0916   CO2 21 05/23/2023 0916   GLUCOSE 86 05/23/2023 0916   GLUCOSE 103 (H) 01/14/2017 0442   BUN 11 05/23/2023 0916   CREATININE 0.77 05/23/2023 0916   CALCIUM 9.3 05/23/2023 0916   GFRNONAA 114 04/07/2018 1433   GFRAA 132 04/07/2018 1433   Lab Results  Component Value Date   HGBA1C 5.3 05/23/2023   HGBA1C 5.4 07/09/2020   Lab Results  Component Value Date   INSULIN 6.6 06/08/2022   Lab Results  Component Value Date   TSH 1.410 05/23/2023   CBC    Component Value Date/Time   WBC 4.0 05/23/2023 0916   WBC 8.3 01/05/2021 1132   RBC 4.05 05/23/2023 0916   RBC 3.37 (L) 01/05/2021 1132   HGB 11.8 05/23/2023 0916   HGB 11.4 02/08/2013 0000   HCT 36.0 05/23/2023 0916   HCT 33 02/08/2013 0000   PLT 216 05/23/2023 0916   PLT 174 02/08/2013 0000   MCV 89 05/23/2023 0916   MCH 29.1 05/23/2023 0916   MCH 26.7 01/05/2021 1132   MCHC 32.8 05/23/2023 0916   MCHC 32.0 01/05/2021 1132   RDW 12.3 05/23/2023 0916  Iron Studies    Component Value Date/Time   IRON 63 04/21/2021 1147   TIBC 311 04/21/2021 1147   FERRITIN 40 04/21/2021 1147   IRONPCTSAT 20 04/21/2021 1147   Lipid Panel     Component Value Date/Time   CHOL 171 05/23/2023 0916   TRIG 34 05/23/2023 0916   HDL 66 05/23/2023 0916   CHOLHDL 2.6 05/23/2023 0916   LDLCALC 97 05/23/2023 0916   LDLDIRECT 85 09/14/2022 1649   Hepatic Function Panel     Component Value Date/Time   PROT 7.5 06/08/2022 1526   ALBUMIN 4.5 06/08/2022 1526   AST 25 06/08/2022 1526   ALT 26 06/08/2022 1526   ALKPHOS 57 06/08/2022 1526   BILITOT 0.4 06/08/2022 1526      Component Value Date/Time   TSH 1.410 05/23/2023 0916   TSH 0.572 05/03/2022 1636   Nutritional Lab Results  Component Value Date   VD25OH 48.7 05/23/2023   VD25OH 42.2 09/14/2022   VD25OH 17.8 (L)  05/03/2022    Medications: Outpatient Encounter Medications as of 07/13/2023  Medication Sig   Cholecalciferol (VITAMIN D3) 125 MCG (5000 UT) TABS Take by mouth daily at 12 noon.   ferrous sulfate 325 (65 FE) MG tablet Take 325 mg by mouth daily with breakfast.   naproxen (NAPROSYN) 500 MG tablet Take by mouth.   [DISCONTINUED] metFORMIN (GLUCOPHAGE-XR) 500 MG 24 hr tablet Take 1 tablet (500 mg total) by mouth daily with breakfast.   [DISCONTINUED] phentermine (ADIPEX-P) 37.5 MG tablet Take 0.5 tablets (18.75 mg total) by mouth daily before breakfast.   metFORMIN (GLUCOPHAGE-XR) 500 MG 24 hr tablet Take 1 tablet (500 mg total) by mouth daily with breakfast.   No facility-administered encounter medications on file as of 07/13/2023.     Follow-Up   Return in about 4 weeks (around 08/10/2023) for For Weight Mangement with Dr. Rikki Spearing.Marland Kitchen She was informed of the importance of frequent follow up visits to maximize her success with intensive lifestyle modifications for her multiple health conditions.  Attestation Statement   Reviewed by clinician on day of visit: allergies, medications, problem list, medical history, surgical history, family history, social history, and previous encounter notes.     Worthy Rancher, MD

## 2023-07-13 NOTE — Assessment & Plan Note (Signed)
 Ramiya has done well she has lost 20 pounds or 12% of total body weight on medically supervised weight management plan inclusive of phentermine.  She would like to come off medication not because of side effect she would like to be able to manage her weight with nutrition and behavioral strategies.  She has decided to continue on metformin as she would like to prevent diabetes and also for weight management.  She will continue with current reduced calorie nutrition plan

## 2023-07-13 NOTE — Assessment & Plan Note (Signed)
 LDL cholesterol decreased from 153 mg/dL to 96 mg/dL, showing significant improvement, likely due to dietary changes and increased physical activity. - Continue current dietary and exercise regimen to maintain cholesterol levels.

## 2023-09-19 ENCOUNTER — Ambulatory Visit (INDEPENDENT_AMBULATORY_CARE_PROVIDER_SITE_OTHER): Admitting: Internal Medicine

## 2023-10-24 ENCOUNTER — Encounter (INDEPENDENT_AMBULATORY_CARE_PROVIDER_SITE_OTHER): Payer: Self-pay | Admitting: Internal Medicine

## 2023-10-24 ENCOUNTER — Ambulatory Visit (INDEPENDENT_AMBULATORY_CARE_PROVIDER_SITE_OTHER): Admitting: Internal Medicine

## 2023-10-24 VITALS — BP 120/75 | HR 85 | Temp 98.0°F | Ht 66.0 in | Wt 162.0 lb

## 2023-10-24 DIAGNOSIS — E7800 Pure hypercholesterolemia, unspecified: Secondary | ICD-10-CM | POA: Diagnosis not present

## 2023-10-24 DIAGNOSIS — Z6826 Body mass index (BMI) 26.0-26.9, adult: Secondary | ICD-10-CM | POA: Insufficient documentation

## 2023-10-24 DIAGNOSIS — R7303 Prediabetes: Secondary | ICD-10-CM

## 2023-10-24 DIAGNOSIS — E669 Obesity, unspecified: Secondary | ICD-10-CM | POA: Insufficient documentation

## 2023-10-24 NOTE — Assessment & Plan Note (Signed)
 She has achieved significant weight loss, losing 17% of her body weight, surpassing the typical 5-10% loss with lifestyle changes alone. She has discontinued phentermine  and is currently on metformin  for diabetes prevention. Her body composition has improved, with a reduction in body fat percentage from nearly 40% to 33%, and a decrease in visceral fat. Her BMI has decreased from 31 to 26, indicating a transition to a healthier weight category. She is motivated to maintain her current weight and improve her health through lifestyle modifications. - Continue metformin  until current supply is finished - Increase physical activity to 240 minutes per week - Maintain protein intake at 90 grams per day, 30 grams per meal - Ensure adequate hydration - Focus on maintaining current weight and body composition

## 2023-10-24 NOTE — Assessment & Plan Note (Signed)
 LDL cholesterol decreased from 153 mg/dL to 96 mg/dL, showing significant improvement, likely due to dietary changes and increased physical activity. - Continue current dietary and exercise regimen to maintain cholesterol levels.

## 2023-10-24 NOTE — Assessment & Plan Note (Signed)
 Her A1c has changed from 5.7 to 5.3 while on metformin . She is considering discontinuing metformin  to evaluate her A1c without medication influence. Metformin  aids in diabetes prevention, cholesterol management.  Plan to recheck her A1c three months after stopping metformin  to assess the need for continued medication. - Complete current metformin  supply - Recheck A1c three months after stopping metformin  - Monitor hunger and cravings after discontinuing metformin 

## 2023-10-24 NOTE — Progress Notes (Signed)
 Office: [phone]  /  Fax: [phone]  Weight Summary and Body Composition Analysis (BIA)  Vitals Temp: 98 F (36.7 C) BP: 120/75 Pulse Rate: 85 SpO2: 97 %   Anthropometric Measurements Height: 5' 6 (1.676 m) Weight: 162 lb (73.5 kg) BMI (Calculated): 26.16 Weight at Last Visit: 166 lb Weight Lost Since Last Visit: 4 lb Weight Gained Since Last Visit: 0 lb Starting Weight: 186 lb Total Weight Loss (lbs): 24 lb (10.9 kg) Peak Weight: 194   Body Composition  Body Fat %: 33.3 % Fat Mass (lbs): 54.2 lbs Muscle Mass (lbs): 103 lbs Total Body Water (lbs): 74.2 lbs Visceral Fat Rating : 5    RMR: 1498  Today's Visit #: 15  Starting Date: 06/15/22   Subjective   Chief Complaint: Obesity  Interval History Discussed the use of AI scribe software for clinical note transcription with the patient, who gave verbal consent to proceed.  History of Present Illness   [name] is a 38 year old female with prediabetes and hypercholesterolemia who presents for medical weight management. She is accompanied by her young child, [name].  She has achieved a significant weight loss of 24 pounds, which is 13% of her total body weight, and continues to lose weight despite discontinuing phentermine . Her highest recorded weight was 194 pounds, and she is currently at 161 pounds, marking a 17% total body weight loss. She focuses on maintaining a protein intake of 90 grams per day and staying hydrated.  She is currently taking metformin  for diabetes prevention and has been consistent with her medication, not missing a single dose. Her A1c improved from 5.7 to 5.3. She plans to finish her current supply of metformin , which will last about three more weeks. She reports good appetite control, with cravings mainly for chocolate, and follows a diet focusing on protein intake, aiming for 30 grams per meal, and has been increasing her intake of fruits and vegetables.  Her LDL  cholesterol has improved from 153 to 96. She has seen significant improvements in her body composition, with a decrease in body fat percentage from nearly 40% to 33%, and her visceral fat level is now at 5, down from 7. Her BMI has decreased from 31 to 26.  She speaks [place] and [place] at home, and her child is exposed to multiple languages, which may be affecting his speech development.       Challenges affecting patient progress: none.    Pharmacotherapy for weight management: She is currently taking Metformin  (off label use for incretin effect and / or insulin  resistance and / or diabetes prevention) with adequate clinical response  and without side effects..   Assessment and Plan   Treatment Plan For Obesity:  Recommended Dietary Goals  [name] is currently in the action stage of change. As such, her goal is to continue weight management plan. She has agreed to: continue current plan  Behavioral Health and Counseling  We discussed the following behavioral modification strategies today: continue to work on maintaining a reduced calorie state, getting the recommended amount of protein, incorporating whole foods, making healthy choices, staying well hydrated and practicing mindfulness when eating..  Additional education and resources provided today: None  Recommended Physical Activity Goals  [name] has been advised to work up to 150 minutes of moderate intensity aerobic activity a week and strengthening exercises 2-3 times per week for cardiovascular health, weight loss maintenance and preservation of muscle mass.   She has agreed to :  Think  about enjoyable ways to increase daily physical activity and overcoming barriers to exercise and Increase physical activity in their day and reduce sedentary time (increase NEAT).  Medical Interventions and Pharmacotherapy  We discussed various medication options to help [name] with her weight loss efforts and we both agreed to : Continue with  current nutritional and behavioral strategies and Adequate clinical response to anti-obesity medication, continue current regimen  Associated Conditions Impacted by Obesity Treatment  Assessment & Plan Prediabetes Her A1c has changed from 5.7 to 5.3 while on metformin . She is considering discontinuing metformin  to evaluate her A1c without medication influence. Metformin  aids in diabetes prevention, cholesterol management.  Plan to recheck her A1c three months after stopping metformin  to assess the need for continued medication. - Complete current metformin  supply - Recheck A1c three months after stopping metformin  - Monitor hunger and cravings after discontinuing metformin  Elevated LDL cholesterol level LDL cholesterol decreased from 153 mg/dL to 96 mg/dL, showing significant improvement, likely due to dietary changes and increased physical activity. - Continue current dietary and exercise regimen to maintain cholesterol levels. Generalized obesity with starting BMI of 31 She has achieved significant weight loss, losing 17% of her body weight, surpassing the typical 5-10% loss with lifestyle changes alone. She has discontinued phentermine  and is currently on metformin  for diabetes prevention. Her body composition has improved, with a reduction in body fat percentage from nearly 40% to 33%, and a decrease in visceral fat. Her BMI has decreased from 31 to 26, indicating a transition to a healthier weight category. She is motivated to maintain her current weight and improve her health through lifestyle modifications. - Continue metformin  until current supply is finished - Increase physical activity to 240 minutes per week - Maintain protein intake at 90 grams per day, 30 grams per meal - Ensure adequate hydration - Focus on maintaining current weight and body composition BMI 26.0-26.9,adult      General Health Maintenance She is focused on maintaining her current weight and improving her  overall health through lifestyle changes, including increased intake of vegetables and fruits and regular physical activity. - Encourage continued healthy eating habits, including increased vegetable and fruit intake - Reinforce the importance of regular physical activity  Follow-up She prefers follow-up appointments every six weeks to monitor progress and maintain accountability. - Schedule follow-up appointment in six weeks - Plan for A1c recheck three months after stopping metformin         Objective   Physical Exam:  Blood pressure 120/75, pulse 85, temperature 98 F (36.7 C), height 5' 6 (1.676 m), weight 162 lb (73.5 kg), last menstrual period 10/16/2023, SpO2 97%. Body mass index is 26.15 kg/m.  General: She is overweight, cooperative, alert, well developed, and in no acute distress. PSYCH: Has normal mood, affect and thought process.   HEENT: EOMI, sclerae are anicteric. Lungs: Normal breathing effort, no conversational dyspnea. Extremities: No edema.  Neurologic: No gross sensory or motor deficits. No tremors or fasciculations noted.    Diagnostic Data Reviewed:  BMET    Component Value Date/Time   NA 139 05/23/2023 0916   K 4.4 05/23/2023 0916   CL 103 05/23/2023 0916   CO2 21 05/23/2023 0916   GLUCOSE 86 05/23/2023 0916   GLUCOSE 103 (H) 01/14/2017 0442   BUN 11 05/23/2023 0916   CREATININE 0.77 05/23/2023 0916   CALCIUM 9.3 05/23/2023 0916   GFRNONAA 114 04/07/2018 1433   GFRAA 132 04/07/2018 1433   Lab Results  Component Value Date  HGBA1C 5.3 05/23/2023   HGBA1C 5.4 07/09/2020   Lab Results  Component Value Date   INSULIN  6.6 06/08/2022   Lab Results  Component Value Date   TSH 1.410 05/23/2023   CBC    Component Value Date/Time   WBC 4.0 05/23/2023 0916   WBC 8.3 01/05/2021 1132   RBC 4.05 05/23/2023 0916   RBC 3.37 (L) 01/05/2021 1132   HGB 11.8 05/23/2023 0916   HGB 11.4 02/08/2013 0000   HCT 36.0 05/23/2023 0916   HCT 33  02/08/2013 0000   PLT 216 05/23/2023 0916   PLT 174 02/08/2013 0000   MCV 89 05/23/2023 0916   MCH 29.1 05/23/2023 0916   MCH 26.7 01/05/2021 1132   MCHC 32.8 05/23/2023 0916   MCHC 32.0 01/05/2021 1132   RDW 12.3 05/23/2023 0916   Iron  Studies    Component Value Date/Time   IRON  63 04/21/2021 1147   TIBC 311 04/21/2021 1147   FERRITIN 40 04/21/2021 1147   IRONPCTSAT 20 04/21/2021 1147   Lipid Panel     Component Value Date/Time   CHOL 171 05/23/2023 0916   TRIG 34 05/23/2023 0916   HDL 66 05/23/2023 0916   CHOLHDL 2.6 05/23/2023 0916   LDLCALC 97 05/23/2023 0916   LDLDIRECT 85 09/14/2022 1649   Hepatic Function Panel     Component Value Date/Time   PROT 7.5 06/08/2022 1526   ALBUMIN 4.5 06/08/2022 1526   AST 25 06/08/2022 1526   ALT 26 06/08/2022 1526   ALKPHOS 57 06/08/2022 1526   BILITOT 0.4 06/08/2022 1526      Component Value Date/Time   TSH 1.410 05/23/2023 0916   TSH 0.572 05/03/2022 1636   Nutritional Lab Results  Component Value Date   VD25OH 48.7 05/23/2023   VD25OH 42.2 09/14/2022   VD25OH 17.8 (L) 05/03/2022    Medications: Outpatient Encounter Medications as of 10/24/2023  Medication Sig   Cholecalciferol (VITAMIN D3) 125 MCG (5000 UT) TABS Take by mouth daily at 12 noon.   ferrous sulfate  325 (65 FE) MG tablet Take 325 mg by mouth daily with breakfast.   metFORMIN  (GLUCOPHAGE -XR) 500 MG 24 hr tablet Take 1 tablet (500 mg total) by mouth daily with breakfast.   naproxen  (NAPROSYN ) 500 MG tablet Take by mouth.   No facility-administered encounter medications on file as of 10/24/2023.     Follow-Up   Return in about 6 weeks (around 12/05/2023) for For Weight Mangement with Dr. Francyne.SABRA She was informed of the importance of frequent follow up visits to maximize her success with intensive lifestyle modifications for her multiple health conditions.  Attestation Statement   Reviewed by clinician on day of visit: allergies, medications, problem  list, medical history, surgical history, family history, social history, and previous encounter notes.     Lucas Francyne, MD

## 2023-12-12 ENCOUNTER — Ambulatory Visit (INDEPENDENT_AMBULATORY_CARE_PROVIDER_SITE_OTHER): Admitting: Internal Medicine

## 2023-12-19 ENCOUNTER — Ambulatory Visit (INDEPENDENT_AMBULATORY_CARE_PROVIDER_SITE_OTHER): Admitting: Internal Medicine

## 2023-12-19 ENCOUNTER — Encounter (INDEPENDENT_AMBULATORY_CARE_PROVIDER_SITE_OTHER): Payer: Self-pay | Admitting: Internal Medicine

## 2023-12-19 VITALS — BP 117/69 | HR 66 | Temp 98.1°F | Ht 66.0 in | Wt 162.0 lb

## 2023-12-19 DIAGNOSIS — E559 Vitamin D deficiency, unspecified: Secondary | ICD-10-CM

## 2023-12-19 DIAGNOSIS — R7303 Prediabetes: Secondary | ICD-10-CM

## 2023-12-19 DIAGNOSIS — E669 Obesity, unspecified: Secondary | ICD-10-CM

## 2023-12-19 DIAGNOSIS — E7800 Pure hypercholesterolemia, unspecified: Secondary | ICD-10-CM

## 2023-12-19 DIAGNOSIS — Z6826 Body mass index (BMI) 26.0-26.9, adult: Secondary | ICD-10-CM

## 2023-12-19 NOTE — Progress Notes (Signed)
 Office: [phone]  /  Fax: [phone]  Weight Summary and Body Composition Analysis (BIA)  Vitals Temp: 98.1 F (36.7 C) BP: 117/69 Pulse Rate: 66 SpO2: 100 %   Anthropometric Measurements Height: 5' 6 (1.676 m) Weight: 162 lb (73.5 kg) BMI (Calculated): 26.16 Weight at Last Visit: 162 lb Weight Lost Since Last Visit: 0 lb Weight Gained Since Last Visit: 0lb Starting Weight: 186 lb Total Weight Loss (lbs): 24 lb (10.9 kg) Peak Weight: 194 lb   Body Composition  Body Fat %: 33.7 % Fat Mass (lbs): 54.8 lbs Muscle Mass (lbs): 102.4 lbs Total Body Water (lbs): 72.2 lbs Visceral Fat Rating : 5    RMR: 1498  Today's Visit #: 16  Starting Date: 06/15/22   Subjective   Chief Complaint: Obesity  Interval History Discussed the use of AI scribe software for clinical note transcription with the patient, who gave verbal consent to proceed.  History of Present Illness [name] is a 38 year old female who presents for medical weight management.  She has been following a 1200 calorie nutrition plan approximately 60% of the time, focusing on whole foods, adequate protein, and maintaining hydration. She exercises two days a week, engaging in 60 minutes of cardio each session.  Over the past year and seven months, she has lost approximately 32 pounds, which is a 16.5% reduction in her body weight. She attributes her success to dietary changes, particularly increasing protein intake and reducing unhealthy snacking. She has also incorporated more fruits and vegetables into her diet.  She was previously on metformin  for prediabetes but has not taken it for about a month. Her body fat percentage is down to 33%.  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 She has been making efforts to increase her physical activity, including walking more and engaging in activities with her children,  such as walking around the soccer field while her son plays.     Challenges affecting patient progress: none.    Pharmacotherapy for weight management: She is currently taking Metformin  (off label use for weight management and / or insulin  resistance and / or diabetes prevention) with adequate clinical response  and without side effects..   Assessment and Plan   Treatment Plan For Obesity:  Recommended Dietary Goals  [name] is currently in the action stage of change. As such, her goal is to continue weight management plan. She has agreed to: continue current plan  Behavioral Health and Counseling  We discussed the following behavioral modification strategies today: continue to work on maintaining a reduced calorie state, getting the recommended amount of protein, incorporating whole foods, making healthy choices, staying well hydrated and practicing mindfulness when eating. and increase protein intake, fibrous foods (25 grams per day for women, 30 grams for men) and water to improve satiety and decrease hunger signals. .  Additional education and resources provided today: None  Recommended Physical Activity Goals  [name] has been advised to work up to 150 minutes of moderate intensity aerobic activity a week and strengthening exercises 2-3 times per week for cardiovascular health, weight loss maintenance and preservation of muscle mass.  She has agreed to :  Think about enjoyable ways to increase daily physical activity and overcoming barriers to exercise, Increase physical activity in their day and reduce sedentary time (increase NEAT)., Increase volume of physical activity to a goal of 240 minutes a week, and Combine aerobic and  strengthening exercises for efficiency and improved cardiometabolic health.  Medical Interventions and Pharmacotherapy  We discussed various medication options to help [name] with her weight loss efforts and we both agreed to : Continue with current nutritional  and behavioral strategies  Associated Conditions Impacted by Obesity Treatment  Assessment & Plan Generalized obesity with starting BMI of 31 BMI 26.0-26.9,adult Weight: decrease of 32 lb (16.5%) over 1 year, 7 months  Start: 05/03/2022 194 lb (88 kg)  End: 12/19/2023 162 lb (73.5 kg)  Obesity management is ongoing with significant progress. She has lost approximately 32 pounds over a year and seven months, equating to a 16.5% weight loss, comparable to results seen with pharmacotherapy like Wegovy. This weight loss has been achieved through lifestyle modifications without weight loss medications. She follows a 1200 calorie nutrition plan 60% of the time, focusing on whole foods, adequate protein intake, and hydration. She exercises two days a week with 60 minutes of cardio. The importance of exercise beyond calorie burning was discussed, highlighting benefits such as improved insulin  sensitivity, reduced inflammation, and enhanced cardiovascular health. - Continue current nutrition plan with focus on whole foods and adequate protein intake. - Increase physical activity to 240 minutes per week, aiming for an hour of cardio four days a week. - Encourage tracking steps and increasing daily step count to 7,000 steps. Prediabetes Her A1c has changed from 5.7 to 5.3 while on metformin . Prediabetes has shown improvement with weight loss and lifestyle changes. She has been off metformin  for about a month. The plan is to monitor her A1c and cholesterol in two months to assess the need for metformin . Exercise is emphasized as a means to improve insulin  sensitivity and potentially eliminate the need for medication. - Hold metformin  for three months. - Order A1c test in two months. - Encourage increased physical activity as a means to improve insulin  sensitivity. Elevated LDL cholesterol level LDL cholesterol decreased from 153 mg/dL to 96 mg/dL.Hypercholesterolemia has improved with weight loss and lifestyle  changes. The effect of metformin  on cholesterol levels is being evaluated. She has been off metformin  for about a month, and cholesterol levels will be reassessed in two months to determine if medication is needed. - Order fasting cholesterol test in two months. - Monitor cholesterol levels to assess the impact of discontinuing metformin . - Continue current dietary and exercise regimen to maintain cholesterol levels. Vitamin D  deficiency She is requesting her vitamin D  levels to be checked.      Orders Placed This Encounter  Procedures   Lipid Panel With LDL/HDL Ratio    Release to patient:   Immediate   Hemoglobin A1c    Release to patient:   Immediate   Insulin , random    Release to patient:   Immediate   VITAMIN D  25 Hydroxy (Vit-D Deficiency, Fractures)    Release to patient:   Immediate    Release to patient:   Immediate [1]       Objective   Physical Exam:  Blood pressure 117/69, pulse 66, temperature 98.1 F (36.7 C), height 5' 6 (1.676 m), weight 162 lb (73.5 kg), last menstrual period 11/18/2023, SpO2 100%. Body mass index is 26.15 kg/m.  General: She is overweight, cooperative, alert, well developed, and in no acute distress. PSYCH: Has normal mood, affect and thought process.   HEENT: EOMI, sclerae are anicteric. Lungs: Normal breathing effort, no conversational dyspnea. Extremities: No edema.  Neurologic: No gross sensory or motor deficits. No tremors or fasciculations noted.  Diagnostic Data Reviewed:  BMET    Component Value Date/Time   NA 139 05/23/2023 0916   K 4.4 05/23/2023 0916   CL 103 05/23/2023 0916   CO2 21 05/23/2023 0916   GLUCOSE 86 05/23/2023 0916   GLUCOSE 103 (H) 01/14/2017 0442   BUN 11 05/23/2023 0916   CREATININE 0.77 05/23/2023 0916   CALCIUM 9.3 05/23/2023 0916   GFRNONAA 114 04/07/2018 1433   GFRAA 132 04/07/2018 1433   Lab Results  Component Value Date   HGBA1C 5.3 05/23/2023   HGBA1C 5.4 07/09/2020   Lab Results   Component Value Date   INSULIN  6.6 06/08/2022   Lab Results  Component Value Date   TSH 1.410 05/23/2023   CBC    Component Value Date/Time   WBC 4.0 05/23/2023 0916   WBC 8.3 01/05/2021 1132   RBC 4.05 05/23/2023 0916   RBC 3.37 (L) 01/05/2021 1132   HGB 11.8 05/23/2023 0916   HGB 11.4 02/08/2013 0000   HCT 36.0 05/23/2023 0916   HCT 33 02/08/2013 0000   PLT 216 05/23/2023 0916   PLT 174 02/08/2013 0000   MCV 89 05/23/2023 0916   MCH 29.1 05/23/2023 0916   MCH 26.7 01/05/2021 1132   MCHC 32.8 05/23/2023 0916   MCHC 32.0 01/05/2021 1132   RDW 12.3 05/23/2023 0916   Iron  Studies    Component Value Date/Time   IRON  63 04/21/2021 1147   TIBC 311 04/21/2021 1147   FERRITIN 40 04/21/2021 1147   IRONPCTSAT 20 04/21/2021 1147   Lipid Panel     Component Value Date/Time   CHOL 171 05/23/2023 0916   TRIG 34 05/23/2023 0916   HDL 66 05/23/2023 0916   CHOLHDL 2.6 05/23/2023 0916   LDLCALC 97 05/23/2023 0916   LDLDIRECT 85 09/14/2022 1649   Hepatic Function Panel     Component Value Date/Time   PROT 7.5 06/08/2022 1526   ALBUMIN 4.5 06/08/2022 1526   AST 25 06/08/2022 1526   ALT 26 06/08/2022 1526   ALKPHOS 57 06/08/2022 1526   BILITOT 0.4 06/08/2022 1526      Component Value Date/Time   TSH 1.410 05/23/2023 0916   TSH 0.572 05/03/2022 1636   Nutritional Lab Results  Component Value Date   VD25OH 48.7 05/23/2023   VD25OH 42.2 09/14/2022   VD25OH 17.8 (L) 05/03/2022    Medications: Outpatient Encounter Medications as of 12/19/2023  Medication Sig   Cholecalciferol (VITAMIN D3) 125 MCG (5000 UT) TABS Take by mouth daily at 12 noon.   ferrous sulfate  325 (65 FE) MG tablet Take 325 mg by mouth daily with breakfast.   metFORMIN  (GLUCOPHAGE -XR) 500 MG 24 hr tablet Take 1 tablet (500 mg total) by mouth daily with breakfast.   naproxen  (NAPROSYN ) 500 MG tablet Take by mouth.   No facility-administered encounter medications on file as of 12/19/2023.      Follow-Up   No follow-ups on file.SABRA She was informed of the importance of frequent follow up visits to maximize her success with intensive lifestyle modifications for her multiple health conditions.  Attestation Statement   Reviewed by clinician on day of visit: allergies, medications, problem list, medical history, surgical history, family history, social history, and previous encounter notes.     Lucas Parker, MD

## 2023-12-19 NOTE — Assessment & Plan Note (Addendum)
 Weight: decrease of 32 lb (16.5%) over 1 year, 7 months  Start: 05/03/2022 194 lb (88 kg)  End: 12/19/2023 162 lb (73.5 kg)  Obesity management is ongoing with significant progress. She has lost approximately 32 pounds over a year and seven months, equating to a 16.5% weight loss, comparable to results seen with pharmacotherapy like Wegovy. This weight loss has been achieved through lifestyle modifications without weight loss medications. She follows a 1200 calorie nutrition plan 60% of the time, focusing on whole foods, adequate protein intake, and hydration. She exercises two days a week with 60 minutes of cardio. The importance of exercise beyond calorie burning was discussed, highlighting benefits such as improved insulin  sensitivity, reduced inflammation, and enhanced cardiovascular health. - Continue current nutrition plan with focus on whole foods and adequate protein intake. - Increase physical activity to 240 minutes per week, aiming for an hour of cardio four days a week. - Encourage tracking steps and increasing daily step count to 7,000 steps.

## 2023-12-19 NOTE — Assessment & Plan Note (Signed)
 She is requesting her vitamin D  levels to be checked.

## 2023-12-19 NOTE — Assessment & Plan Note (Addendum)
 LDL cholesterol decreased from 153 mg/dL to 96 mg/dL.Hypercholesterolemia has improved with weight loss and lifestyle changes. The effect of metformin  on cholesterol levels is being evaluated. She has been off metformin  for about a month, and cholesterol levels will be reassessed in two months to determine if medication is needed. - Order fasting cholesterol test in two months. - Monitor cholesterol levels to assess the impact of discontinuing metformin . - Continue current dietary and exercise regimen to maintain cholesterol levels.

## 2023-12-19 NOTE — Assessment & Plan Note (Addendum)
 Her A1c has changed from 5.7 to 5.3 while on metformin . Prediabetes has shown improvement with weight loss and lifestyle changes. She has been off metformin  for about a month. The plan is to monitor her A1c and cholesterol in two months to assess the need for metformin . Exercise is emphasized as a means to improve insulin  sensitivity and potentially eliminate the need for medication. - Hold metformin  for three months. - Order A1c test in two months. - Encourage increased physical activity as a means to improve insulin  sensitivity.

## 2024-02-13 ENCOUNTER — Ambulatory Visit (INDEPENDENT_AMBULATORY_CARE_PROVIDER_SITE_OTHER): Payer: Self-pay | Admitting: Internal Medicine

## 2024-02-13 ENCOUNTER — Encounter (INDEPENDENT_AMBULATORY_CARE_PROVIDER_SITE_OTHER): Payer: Self-pay | Admitting: Internal Medicine

## 2024-02-13 VITALS — BP 108/72 | HR 71 | Temp 98.0°F | Ht 66.0 in | Wt 166.0 lb

## 2024-02-13 DIAGNOSIS — E669 Obesity, unspecified: Secondary | ICD-10-CM | POA: Diagnosis not present

## 2024-02-13 DIAGNOSIS — R7303 Prediabetes: Secondary | ICD-10-CM | POA: Diagnosis not present

## 2024-02-13 DIAGNOSIS — Z6826 Body mass index (BMI) 26.0-26.9, adult: Secondary | ICD-10-CM | POA: Diagnosis not present

## 2024-02-13 DIAGNOSIS — E7800 Pure hypercholesterolemia, unspecified: Secondary | ICD-10-CM

## 2024-02-13 NOTE — Assessment & Plan Note (Signed)
 Weight: decrease of 28 lb (14.4%) over 1 year, 9 months  Start: 05/03/2022 194 lb (88 kg)  End: 02/13/2024 166 lb (75.3 kg)  Weight gain of four pounds since last visit, likely due to decreased physical activity. Previously on a 200-calorie nutrition plan 70% of the time, but not exercising. Metformin  was discontinued over a month ago, which may have affected appetite and calorie intake. Current metabolic rate suggests a target of under 1400 calories per day for weight loss, with closer to 1000 calories for faster weight loss. Exercise is crucial for maintaining weight loss, especially given her sensitivity to exercise. - Adjust calorie intake to 1000 calories per day for weight loss. - Consider meal replacement with a protein shake to achieve closer to 1000 calories per day. - Incorporate HIIT workouts or other forms of exercise to increase physical activity. - Provided handout with tips for staying on track during the holidays.

## 2024-02-13 NOTE — Assessment & Plan Note (Signed)
 Previous A1c was 5.3% in February. Concern about A1c increasing after discontinuing metformin . If A1c increases, metformin  may need to be restarted. She is concerned about maintaining A1c at 5.3% or lower to avoid diabetes. - Ordered A1c test to assess current status. - If A1c increases, will restart metformin  and send prescription to pharmacy. - Will communicate A1c results via patient portal.

## 2024-02-13 NOTE — Assessment & Plan Note (Signed)
 LDL cholesterol decreased from 153 mg/dL to 96 mg/dL.Hypercholesterolemia has improved with weight loss and lifestyle changes. The effect of metformin  on cholesterol levels is being evaluated. She has been off metformin  for about a month, and cholesterol levels will be reassessed today - Order fasting cholesterol test - Monitor cholesterol levels to assess the impact of discontinuing metformin . - Continue current dietary and exercise regimen to maintain cholesterol levels.

## 2024-02-13 NOTE — Progress Notes (Signed)
 Office: [phone]  /  Fax: [phone]  Weight Summary and Body Composition Analysis (BIA)  Vitals Temp: 98 F (36.7 C) BP: 108/72 Pulse Rate: 71 SpO2: 100 %   Anthropometric Measurements Height: 5' 6 (1.676 m) Weight: 166 lb (75.3 kg) BMI (Calculated): 26.81 Weight at Last Visit: 162 lb Weight Lost Since Last Visit: 0 lb Weight Gained Since Last Visit: 4 lb Starting Weight: 186 lb Total Weight Loss (lbs): 20 lb (9.072 kg) Peak Weight: 194 lb   Body Composition  Body Fat %: 35.2 % Fat Mass (lbs): 58.6 lbs Muscle Mass (lbs): 102.4 lbs Total Body Water (lbs): 71 lbs Visceral Fat Rating : 6    RMR: 1498  Today's Visit #: 17  Starting Date: 06/15/22   Subjective   Chief Complaint: Obesity  Interval History Discussed the use of AI scribe software for clinical note transcription with the patient, who gave verbal consent to proceed.  History of Present Illness  [name] is a 38 year old female who presents for medical weight management.  [name] has experienced a weight gain of four pounds since her last office visit, which [name] attributes to a decrease in physical activity due to changes in weather and temperature.  [name] is following a 1200 calorie nutrition plan approximately 70% of the time and is tracking her intake to ensure [name] gets the recommended amount of protein and maintains adequate hydration. However, [name] has not been exercising recently.  [name] has been off metformin  for over a month and notes that while on the medication, [name] experienced reduced hunger and a feeling of fullness. Her last A1c was 5.3 in February while on metformin , and [name] is concerned about potential increases in her A1c since discontinuing the medication.  Her son has been sick, which has impacted her mood and possibly her sleep, as [name] does not feel [name] has been sleeping well since her son started school six weeks ago.     Challenges affecting patient progress: none.     Pharmacotherapy for weight management: [name] is currently taking Metformin  (off label use for weight management and / or insulin  resistance and / or diabetes prevention) with adequate clinical response  and without side effects..   Assessment and Plan   Treatment Plan For Obesity:  Recommended Dietary Goals  [name] is currently in the action stage of change. As such, her goal is to continue weight management plan. [name] has agreed to: continue current plan  Behavioral Health and Counseling  We discussed the following behavioral modification strategies today: continue to work on maintaining a reduced calorie state, getting the recommended amount of protein, incorporating whole foods, making healthy choices, staying well hydrated and practicing mindfulness when eating. and increase protein intake, fibrous foods (25 grams per day for women, 30 grams for men) and water to improve satiety and decrease hunger signals. .  Additional education and resources provided today: Handout on staying on track during the holidays  Recommended Physical Activity Goals  [name] has been advised to work up to 150 minutes of moderate intensity aerobic activity a week and strengthening exercises 2-3 times per week for cardiovascular health, weight loss maintenance and preservation of muscle mass.  [name] has agreed to :  Think about enjoyable ways to increase daily physical activity and overcoming barriers to exercise, Increase physical activity in their day and reduce sedentary time (increase NEAT)., Increase volume of physical activity to a goal of 240 minutes a week, and Combine aerobic and strengthening exercises  for efficiency and improved cardiometabolic health.  Medical Interventions and Pharmacotherapy  We discussed various medication options to help [name] with her weight loss efforts and we both agreed to : Continue with current nutritional and behavioral strategies  Associated Conditions Impacted by Obesity  Treatment  Assessment & Plan Prediabetes Previous A1c was 5.3% in February. Concern about A1c increasing after discontinuing metformin . If A1c increases, metformin  may need to be restarted. [name] is concerned about maintaining A1c at 5.3% or lower to avoid diabetes. - Ordered A1c test to assess current status. - If A1c increases, will restart metformin  and send prescription to pharmacy. - Will communicate A1c results via patient portal. Elevated LDL cholesterol level LDL cholesterol decreased from 153 mg/dL to 96 mg/dL.Hypercholesterolemia has improved with weight loss and lifestyle changes. The effect of metformin  on cholesterol levels is being evaluated. [name] has been off metformin  for about a month, and cholesterol levels will be reassessed today - Order fasting cholesterol test - Monitor cholesterol levels to assess the impact of discontinuing metformin . - Continue current dietary and exercise regimen to maintain cholesterol levels. Generalized obesity with starting BMI of 31 BMI 26.0-26.9,adult Weight: decrease of 28 lb (14.4%) over 1 year, 9 months  Start: 05/03/2022 194 lb (88 kg)  End: 02/13/2024 166 lb (75.3 kg)  Weight gain of four pounds since last visit, likely due to decreased physical activity. Previously on a 200-calorie nutrition plan 70% of the time, but not exercising. Metformin  was discontinued over a month ago, which may have affected appetite and calorie intake. Current metabolic rate suggests a target of under 1400 calories per day for weight loss, with closer to 1000 calories for faster weight loss. Exercise is crucial for maintaining weight loss, especially given her sensitivity to exercise. - Adjust calorie intake to 1000 calories per day for weight loss. - Consider meal replacement with a protein shake to achieve closer to 1000 calories per day. - Incorporate HIIT workouts or other forms of exercise to increase physical activity. - Provided handout with tips for staying on  track during the holidays.       Orders Placed This Encounter  Procedures   Lipid Panel With LDL/HDL Ratio    Release to patient:   Immediate   Hemoglobin A1c    Release to patient:   Immediate       Objective   Physical Exam:  Blood pressure 108/72, pulse 71, temperature 98 F (36.7 C), height 5' 6 (1.676 m), weight 166 lb (75.3 kg), last menstrual period 02/10/2024, SpO2 100%. Body mass index is 26.79 kg/m.  General: [name] is overweight, cooperative, alert, well developed, and in no acute distress. PSYCH: Has normal mood, affect and thought process.   HEENT: EOMI, sclerae are anicteric. Lungs: Normal breathing effort, no conversational dyspnea. Extremities: No edema.  Neurologic: No gross sensory or motor deficits. No tremors or fasciculations noted.    Diagnostic Data Reviewed:  BMET    Component Value Date/Time   NA 139 05/23/2023 0916   K 4.4 05/23/2023 0916   CL 103 05/23/2023 0916   CO2 21 05/23/2023 0916   GLUCOSE 86 05/23/2023 0916   GLUCOSE 103 (H) 01/14/2017 0442   BUN 11 05/23/2023 0916   CREATININE 0.77 05/23/2023 0916   CALCIUM 9.3 05/23/2023 0916   GFRNONAA 114 04/07/2018 1433   GFRAA 132 04/07/2018 1433   Lab Results  Component Value Date   HGBA1C 5.3 05/23/2023   HGBA1C 5.4 07/09/2020   Lab Results  Component Value Date  INSULIN  6.6 06/08/2022   Lab Results  Component Value Date   TSH 1.410 05/23/2023   CBC    Component Value Date/Time   WBC 4.0 05/23/2023 0916   WBC 8.3 01/05/2021 1132   RBC 4.05 05/23/2023 0916   RBC 3.37 (L) 01/05/2021 1132   HGB 11.8 05/23/2023 0916   HGB 11.4 02/08/2013 0000   HCT 36.0 05/23/2023 0916   HCT 33 02/08/2013 0000   PLT 216 05/23/2023 0916   PLT 174 02/08/2013 0000   MCV 89 05/23/2023 0916   MCH 29.1 05/23/2023 0916   MCH 26.7 01/05/2021 1132   MCHC 32.8 05/23/2023 0916   MCHC 32.0 01/05/2021 1132   RDW 12.3 05/23/2023 0916   Iron  Studies    Component Value Date/Time   IRON  63  04/21/2021 1147   TIBC 311 04/21/2021 1147   FERRITIN 40 04/21/2021 1147   IRONPCTSAT 20 04/21/2021 1147   Lipid Panel     Component Value Date/Time   CHOL 171 05/23/2023 0916   TRIG 34 05/23/2023 0916   HDL 66 05/23/2023 0916   CHOLHDL 2.6 05/23/2023 0916   LDLCALC 97 05/23/2023 0916   LDLDIRECT 85 09/14/2022 1649   Hepatic Function Panel     Component Value Date/Time   PROT 7.5 06/08/2022 1526   ALBUMIN 4.5 06/08/2022 1526   AST 25 06/08/2022 1526   ALT 26 06/08/2022 1526   ALKPHOS 57 06/08/2022 1526   BILITOT 0.4 06/08/2022 1526      Component Value Date/Time   TSH 1.410 05/23/2023 0916   TSH 0.572 05/03/2022 1636   Nutritional Lab Results  Component Value Date   VD25OH 48.7 05/23/2023   VD25OH 42.2 09/14/2022   VD25OH 17.8 (L) 05/03/2022    Medications: Outpatient Encounter Medications as of 02/13/2024  Medication Sig   Cholecalciferol (VITAMIN D3) 125 MCG (5000 UT) TABS Take by mouth daily at 12 noon.   ferrous sulfate  325 (65 FE) MG tablet Take 325 mg by mouth daily with breakfast.   naproxen  (NAPROSYN ) 500 MG tablet Take by mouth.   metFORMIN  (GLUCOPHAGE -XR) 500 MG 24 hr tablet Take 1 tablet (500 mg total) by mouth daily with breakfast. (Patient not taking: Reported on 02/13/2024)   No facility-administered encounter medications on file as of 02/13/2024.     Follow-Up   Return in about 6 weeks (around 03/26/2024) for For Weight Mangement with Dr. Francyne.[name] [name] was informed of the importance of frequent follow up visits to maximize her success with intensive lifestyle modifications for her multiple health conditions.  Attestation Statement   Reviewed by clinician on day of visit: allergies, medications, problem list, medical history, surgical history, family history, social history, and previous encounter notes.     Lucas Francyne, MD

## 2024-02-14 ENCOUNTER — Ambulatory Visit (INDEPENDENT_AMBULATORY_CARE_PROVIDER_SITE_OTHER): Payer: Self-pay | Admitting: Internal Medicine

## 2024-02-14 LAB — LIPID PANEL WITH LDL/HDL RATIO
Cholesterol, Total: 159 mg/dL (ref 100–199)
HDL: 68 mg/dL (ref >39)
LDL Chol Calc (NIH): 83 mg/dL (ref 0–99)
LDL/HDL Ratio: 1.2 ratio (ref 0.0–3.2)
Triglycerides: 34 mg/dL (ref 0–149)
VLDL Cholesterol Cal: 8 mg/dL (ref 5–40)

## 2024-02-14 LAB — HEMOGLOBIN A1C
Est. average glucose Bld gHb Est-mCnc: 111 mg/dL
Hgb A1c MFr Bld: 5.5 % (ref 4.8–5.6)

## 2024-03-19 ENCOUNTER — Ambulatory Visit (INDEPENDENT_AMBULATORY_CARE_PROVIDER_SITE_OTHER): Admitting: Internal Medicine

## 2024-04-02 ENCOUNTER — Ambulatory Visit (INDEPENDENT_AMBULATORY_CARE_PROVIDER_SITE_OTHER): Admitting: Internal Medicine

## 2024-04-02 ENCOUNTER — Encounter (INDEPENDENT_AMBULATORY_CARE_PROVIDER_SITE_OTHER): Payer: Self-pay

## 2024-05-09 ENCOUNTER — Ambulatory Visit (INDEPENDENT_AMBULATORY_CARE_PROVIDER_SITE_OTHER): Admitting: Internal Medicine

## 2024-05-24 ENCOUNTER — Ambulatory Visit (INDEPENDENT_AMBULATORY_CARE_PROVIDER_SITE_OTHER): Admitting: Internal Medicine
